# Patient Record
Sex: Male | Born: 1945
Health system: Southern US, Community
[De-identification: ages and names within clinical notes are randomized; demographics above are authoritative.]

## PROBLEM LIST (undated history)

## (undated) DIAGNOSIS — E119 Type 2 diabetes mellitus without complications: Secondary | ICD-10-CM

## (undated) DIAGNOSIS — I10 Essential (primary) hypertension: Secondary | ICD-10-CM

## (undated) DIAGNOSIS — M1990 Unspecified osteoarthritis, unspecified site: Secondary | ICD-10-CM

## (undated) HISTORY — DX: Type 2 diabetes mellitus without complications: E11.9

## (undated) HISTORY — DX: Essential (primary) hypertension: I10

## (undated) HISTORY — DX: Unspecified osteoarthritis, unspecified site: M19.90

---

## 2013-02-12 ENCOUNTER — Ambulatory Visit: Payer: Medicare Other | Attending: Audiology | Admitting: Audiology

## 2013-02-12 DIAGNOSIS — H905 Unspecified sensorineural hearing loss: Secondary | ICD-10-CM | POA: Insufficient documentation

## 2013-02-12 NOTE — Patient Instructions (Addendum)
CONCLUSION:   Mr. 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  This is supported by the DPOAE test results.  He has mildly reduced speech understanding abilities on the left side at this time.  His middle ear function is normal bilaterally and acoustic reflexes are present on both sides.  RECOMMENDATIONS:    1. Audiological re-evaluation in one year to monitor asymmetrical hearing loss. 2. Continue to monitor hearing at home.  Should any changes be noted, a re-evaluation should be scheduled sooner. 3. Mr. Connor [name] was counseled on the the use of ear protection and given strategies to manipulate his environment for optimal hearing success. 4. Mr. Connor [name] is a hearing aid candidate and a hearing aid trial/fitting is recommended for the left side.   Allyn Kenner Pugh, Au.D. Doctor of Audiology

## 2013-02-12 NOTE — Procedures (Signed)
   [facility] [address] [place], [place]  [address] [phone]  AUDIOLOGICAL EVALUATION  Patient Name: Connor [name]  Medical Record Number:  [id number] Date of Birth:  1946/04/11    Date of Test:  02/12/2013  HISTORY:  [name], a delightful 67 y.o. old  was seen for audiological evaluation upon referral of Joycelyn Man, FNP.   The patient reported a gradual decrease in hearing over the past 30 years following noise exposure in the [employer].  He reports intermittent bilateral tinnitus and intermittent difficulty with communication in background noise.  He denies head trauma, facial numbness, dizziness, middle ear disease and familial history of hearing loss.  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  Mr. Wallis Bamberg has Type II diabetes and is on daily injections.    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   Speech reception thresholds were consistent with the pure tone results indicative of good test reliability.  Speech recognition testing was conducted in each ear independently, at a comfortable listening level 55dBHL and indicated 100% and 88% in the right and left ears respectively.  Impedance audiometry was utilized and Type A was obtained on the right side and a Type A was obtained on the left side suggesting good middle ear function bilaterally.  Acoustic reflexes were tested from 500Hz  - 4000Hz  and were present on the right side and present on the left side.  Distortion Product Otoacoustic Emissions were tested from 2000Hz  - 10000Hz  and were weak on the right side and weak or absent on the left side, indicative of poor outer  hair cell function within the inner ear.  CONCLUSION:   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  This is supported by the DPOAE test results.  He has mildly reduced speech understanding abilities on the left side at this time.  His middle ear function is normal bilaterally and acoustic reflexes are present on both sides.  RECOMMENDATIONS:    1. Audiological re-evaluation in one year to monitor asymmetrical hearing loss. 2. Continue to monitor hearing at home.  Should any changes be noted, a re-evaluation should be scheduled sooner. 3. [name] was counseled on the the use of ear protection and given strategies to manipulate his environment for optimal hearing success. 4. [name] is a hearing aid candidate and a hearing aid trial/fitting is recommended for the left side.   Allyn Kenner Pugh, Au.D. Doctor of Audiology CCC-A

## 2013-02-22 ENCOUNTER — Ambulatory Visit: Payer: Medicaid Other | Admitting: Audiology

## 2014-07-26 ENCOUNTER — Encounter: Payer: Self-pay | Admitting: Rehabilitative and Restorative Service Providers"

## 2014-07-26 ENCOUNTER — Ambulatory Visit: Payer: Medicare Other | Attending: Specialist | Admitting: Rehabilitative and Restorative Service Providers"

## 2014-07-26 VITALS — BP 157/76

## 2014-07-26 DIAGNOSIS — R51 Headache: Secondary | ICD-10-CM | POA: Insufficient documentation

## 2014-07-26 DIAGNOSIS — E119 Type 2 diabetes mellitus without complications: Secondary | ICD-10-CM | POA: Diagnosis not present

## 2014-07-26 DIAGNOSIS — R42 Dizziness and giddiness: Secondary | ICD-10-CM | POA: Insufficient documentation

## 2014-07-26 DIAGNOSIS — I10 Essential (primary) hypertension: Secondary | ICD-10-CM | POA: Insufficient documentation

## 2014-07-26 NOTE — Patient Instructions (Signed)
Gaze Stabilization: Tip Card 1.Target must remain in focus, not blurry, and appear stationary while head is in motion. 2.Perform exercises with small head movements (45 to either side of midline). 3.Increase speed of head motion so long as target is in focus. 4.If you wear eyeglasses, be sure you can see target through lens (therapist will give specific instructions for bifocal / progressive lenses). 5.These exercises may provoke dizziness or nausea. Work through these symptoms. If too dizzy, slow head movement slightly. Rest between each exercise. 6.Exercises demand concentration; avoid distractions. 7.For safety, perform standing exercises close to a counter, wall, corner, or next to someone.  Copyright  VHI. All rights reserved.  Gaze Stabilization: Sitting   Keeping eyes on target on wall 3-5 feet away,  move head side to side for _30___ seconds. Repeat while moving head up and down for _30___ seconds. Do _3___ sessions per day.  Copyright  VHI. All rights reserved.   Walking Program:  Begin walking for exercise for 10 minutes, 1 times/day, 5 days/week.   Progress your walking program by adding 2-3 minutes to your routine each week, as tolerated. Be sure to wear good walking shoes, walk in a safe environment and only progress to your tolerance.      Progress to 30 minutes 4-5 times/week as knees allow.

## 2014-07-26 NOTE — Therapy (Signed)
[facility] [address] [place], [place], [address] Phone: [phone]   Fax:  [phone]  Physical Therapy Evaluation  Patient Details  Name: Connor [name] MRN: [id number] Date of Birth: [birth year] Referring Provider:  Nicolasa Ducking, MD  Encounter Date: 07/26/2014      PT End of Session - 07/26/14 1419    Visit Number 1  G code 1   Number of Visits 4   Date for PT Re-Evaluation 08/24/14   PT Start Time 0845   PT Stop Time 0935   PT Time Calculation (min) 50 min   Activity Tolerance Patient tolerated treatment well   Behavior During Therapy [facility] for tasks assessed/performed      Past Medical History  Diagnosis Date  . Diabetes mellitus without complication   . Hypertension   . Arthritis     knees    No past surgical history on file.  BP 157/76 mmHg  Visit Diagnosis:  Dizziness and giddiness      Subjective Assessment - 07/26/14 0852    Symptoms The patient reports onset of dizziness worse when transitioning from sit>stand and occasionally when sitting up from supine.  He reports occasional episodes of dizziness with bed mobility. He is uncertain of insidious versus sudden onset.   He also has h/o migraines and reports this can occur up to 2x/day.  He can have dizziness and headaches together and separate from each other.  The patient reports recent MRI WNLs.  He reports headaches last for up to 5 minutes, with associated light sensitivity, tinnitus.  He is negative for nausea/vomitting.     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  He reports room spinning sensation that lasts 5-10 minutes when rising.  He reports double vision at times, and occasionally wakes with numbness in his hands.   No visual aura noted with headaches.    Pertinent History L sensorineural hearing  > R side, agent orange exposure in [place]   Patient Stated Goals Decrease  vertigo. He notes feeling embarassed by speaking with others.   Currently in Pain? Yes  knee arthritic pain, not treated under current referral          [facility] PT Assessment - 07/26/14 0001    Assessment   Medical Diagnosis vertigo   Onset Date --  2 years ago   Balance Screen   Has the patient fallen in the past 6 months No   Has the patient had a decrease in activity level because of a fear of falling?  No   Is the patient reluctant to leave their home because of a fear of falling?  No   Home Environment   Living Enviornment Private residence   Type of Home House   Home Layout Two level  stair lift   Home Equipment --  stair lift   Prior Function   Vocation Retired   Observation/Other Assessments   Focus on Therapeutic Outcomes (FOTO)  Functional Status Measures: 56%   Other Surveys  --  DHI=38% (moderate impairment)   Ambulation/Gait   Ambulation/Gait Yes   Ambulation/Gait Assistance 7: Independent   Ambulation Distance (Feet) --  150   Gait Pattern --  mildly antalgic due to knee pain, equal step length   Ambulation Surface Level   Gait velocity 2.61 ft/sec   Balance   Balance Assessed --  Eyes closed x 6 second, feet together eyes open incr'd sway  Vestibular Assessment - 07/26/14 0905    VOR 1 Head Only (x 1 viewing) --  patient reports visual blurring   VOR Cancellation Normal  mild lightheadedness noted   Comment --  head impulse test R side provokes dizziness   Dix-Hallpike Dix-Hallpike Right;Dix-Hallpike Left   Dix-Hallpike Right Symptoms No nystagmus   Dix-Hallpike Left Symptoms No nystagmus   BP supine (x 5 minutes) 164/76 mmHg   BP standing (after 1 minute) 158/78 mmHg   BP standing (after 3 minutes) 158/82 mmHg   Orthostatics Comment --  Pt c/o dizziness described as spinning x seconds upon rising      NEUROMUSCULAR RE-EDUCATION: Seated gaze x 1 viewing Discussed general walking and PT to progress to patient  tolerance.           PT Education - 07/26/14 1419    Education provided Yes   Education Details HEP: gaze x 1 seated, discussed role of PT in treatment   Person(s) Educated Patient   Methods Explanation;Demonstration;Handout   Comprehension Verbalized understanding;Returned demonstration             PT Long Term Goals - 07/26/14 1420    PT LONG TERM GOAL #1   Title The patient will be indep with HEP for gaze x 1 viewing, habituation and general walking.  Target date 08/24/2014.   Time 4   Period Weeks   PT LONG TERM GOAL #2   Title The patient will perform supine<>bilateral rolling without subjective report of dizziness.  Target date 08/24/2014.   Time 4   Period Weeks   PT LONG TERM GOAL #3   Title The patient will tolerate gaze x 1 viewing x 30 seconds without increased dizziness.  Target date 08/24/2014.   Time 4   Period Weeks               Plan - 07/26/14 1421    Clinical Impression Statement The patient is a 69 yo male presenting to outpatient therapy with multiple contributing factors to dizziness.  The patient is reporting more of a postural presentation of symptoms, however orthostatics WNLs at today's session.  He also has hypertension (with mildly elevated BP today) and frequent headaches, which may also be contributing to general dizziness.  PT to address the components of movement that provoked symptoms and progress general mobility to patient tolerance to optimize current functional status.   Pt will benefit from skilled therapeutic intervention in order to improve on the following deficits Abnormal gait;Decreased activity tolerance;Decreased balance;Decreased mobility;Decreased strength;Postural dysfunction;Difficulty walking   Rehab Potential Good   PT Frequency 1x / week   PT Duration 4 weeks   PT Treatment/Interventions Therapeutic activities;Patient/family education;Therapeutic exercise;Gait training;Balance training;Neuromuscular  re-education;Functional mobility training   PT Next Visit Plan Check gaze ex.  Add habituation rolling and balance exercises with eyes open/closed in the corner.  Add home walking program/discuss aquatics if patient open to try.   Recommended Other Services audiology f/u re: 01/2013 recommendations for L hearing aide.   Consulted and Agree with Plan of Care Patient          G-Codes - 07/26/14 1425    Functional Assessment Tool Used DHI=38%   Functional Limitation Self care   Self Care Current Status (Z6109(G8987) At least 20 percent but less than 40 percent impaired, limited or restricted   Self Care Goal Status (U0454(G8988) At least 1 percent but less than 20 percent impaired, limited or restricted       Problem List There  are no active problems to display for this patient.   Miniya Miguez, PT 07/26/2014, 2:26 PM  [facility] [address] [place], [place], [address] Phone: [phone]   Fax:  [phone]

## 2014-08-02 ENCOUNTER — Ambulatory Visit: Payer: Medicare Other | Admitting: Rehabilitative and Restorative Service Providers"

## 2014-08-02 VITALS — BP 127/84 | HR 79

## 2014-08-02 DIAGNOSIS — R42 Dizziness and giddiness: Secondary | ICD-10-CM | POA: Diagnosis not present

## 2014-08-02 NOTE — Patient Instructions (Signed)
Gaze Stabilization: Tip Card 1.Target must remain in focus, not blurry, and appear stationary while head is in motion. 2.Perform exercises with small head movements (45 to either side of midline). 3.Increase speed of head motion so long as target is in focus. 4.If you wear eyeglasses, be sure you can see target through lens (therapist will give specific instructions for bifocal / progressive lenses). 5.These exercises may provoke dizziness or nausea. Work through these symptoms. If too dizzy, slow head movement slightly. Rest between each exercise. 6.Exercises demand concentration; avoid distractions. 7.For safety, perform standing exercises close to a counter, wall, corner, or next to someone.  Copyright  VHI. All rights reserved.  Gaze Stabilization: Standing Feet Apart   Feet shoulder width apart, keeping eyes on target on wall _3___ feet away and move head side to side for _30___ seconds. Do _2-3___ sessions per day.  Copyright  VHI. All rights reserved.  Rolling   With pillow under head, start on back. Roll to the right. Hold position until symptoms stop, plus 30 seconds. Roll slowly onto left side. Hold position until symptoms stop, plus 30 seconds.  Repeat sequence _5___ times per session. Do __2__ sessions per day.  Copyright  VHI. All rights reserved. Sit to Side-Lying   Sit on edge of bed. Turn head to the left and then lie down onto the right side.  Hold until dizziness stops, plus 30 seconds. Sit up slowly.  Hold until dizziness stops, plus 30 seconds. Repeat sequence __5__ times per session. Do __2__ sessions per day.  Copyright  VHI. All rights reserved.    Special Instructions: Exercises may bring on mild to moderate symptoms of dizziness, headache that resolve within 30 minutes of completing exercises. If symptoms are lasting longer than 30 minutes, modify your exercises by:  >decreasing the # of times you complete each activity >ensuring your symptoms return  to baseline before moving onto the next exercise >dividing up exercises so you do not do them all in one session, but multiple short sessions throughout the day >doing them once a day until symptoms improve

## 2014-08-02 NOTE — Therapy (Signed)
[facility] [address] [place], [place], [address] Phone: [phone]   Fax:  [phone]  Physical Therapy Treatment  Patient Details  Name: Mallie DartingCurly Amodei MRN: [id number] Date of Birth: [birth year] Referring Provider:  Nicolasa DuckingPavelock, Richard, MD  Encounter Date: 08/02/2014      PT End of Session - 08/02/14 0859    Visit Number 2  G code 2   Number of Visits 4   Date for PT Re-Evaluation 08/24/14   PT Start Time 0803   PT Stop Time 0848   PT Time Calculation (min) 45 min   Activity Tolerance Patient tolerated treatment well   Behavior [facility] Therapy [facility] for tasks assessed/performed      Past Medical History  Diagnosis Date  . Diabetes mellitus without complication   . Hypertension   . Arthritis     knees    No past surgical history on file.  BP 127/84 mmHg  Pulse 79  Visit Diagnosis:  Dizziness and giddiness      Subjective Assessment - 08/02/14 0807    Symptoms The patient reports one episode of bed spinning when he went to bed.  He also notes a lightheaded sensation when rising.  He reports intermittent headaches and is not certain if blood pressure is elevated during those symptoms.       NEUROMUSCULAR RE-EDUCATION: Standing gaze x 1 with verbal cues and demonstration on slower pace in order to keep target focused.  Performed 30 seconds x 3 reps Habituation rolling R<>L with dizziness reported to the left side only.  Instructed in HEP for repetitive movement to habituate symptoms.  Sit>bilateral sidelying with dizziness reported to the right side only.   Instructed in habituation for brandt daroff HEP.  Gait: Ambulated x 5 minutes nonstop with occasional horizontal, vertical head turns that provoke 5-6/10 dizziness.  Discussed home walking program with short length of time, multiple times/day to improve mobility.  CANOLITH REPOSITIONING TECHNIQUE: R Epley's maneuver x 1 repetition.         PT  Education - 08/02/14 0836    Education provided Yes   Education Details HEP: gaze x 1 progressed to standing, rolling habituation, brandt daroff habituation.   Person(s) Educated Patient   Methods Explanation;Demonstration;Handout   Comprehension Returned demonstration;Verbalized understanding           PT Long Term Goals - 07/26/14 1420    PT LONG TERM GOAL #1   Title The patient will be indep with HEP for gaze x 1 viewing, habituation and general walking.  Target date 08/24/2014.   Time 4   Period Weeks   PT LONG TERM GOAL #2   Title The patient will perform supine<>bilateral rolling without subjective report of dizziness.  Target date 08/24/2014.   Time 4   Period Weeks   PT LONG TERM GOAL #3   Title The patient will tolerate gaze x 1 viewing x 30 seconds without increased dizziness.  Target date 08/24/2014.   Time 4   Period Weeks               Plan - 08/02/14 [id number]    Clinical Impression Statement The patient had minimal nystagmus noted today with sit<>R sidelying and reported sensation of room spinning with horizontal rolling.  PT provided HEP for habituation and treated with canolith repoistioning.   PT Next Visit Plan Balance exercises in the corner with eyes open/closed.  Discuss aquatics if appropriate.        Problem List  There are no active problems to display for this patient.   Teryl Gubler, PT 08/02/2014, 9:02 AM  [facility] [address] [place], [place], [address] Phone: [phone]   Fax:  [phone]

## 2014-08-09 ENCOUNTER — Ambulatory Visit: Payer: Medicare Other | Admitting: Rehabilitative and Restorative Service Providers"

## 2014-09-21 ENCOUNTER — Encounter: Payer: Self-pay | Admitting: Rehabilitative and Restorative Service Providers"

## 2014-09-21 NOTE — Therapy (Signed)
[facility] [address] [place], [place], [address] Phone: [phone]   Fax:  [phone]  Patient Details  Name: Connor [name] MRN: [id number] Date of Birth: January 30, 1946 Referring Provider:  No ref. provider found  Encounter Date: 09/21/2014  PHYSICAL THERAPY DISCHARGE SUMMARY  Visits from Start of Care: 2  Current functional level related to goals / functional outcomes: *patient did not return for visits after 2nd PT session. Goals not reassessed.   Remaining deficits: See initial summary for patient status.   Education / Equipment: HEP provided.  Plan: Patient agrees to discharge.  Patient goals were not met. Patient is being discharged due to not returning since the last visit.  ?????   Thank you for the referral of this patient.    [place], Pt 09/21/2014, 9:14 AM  [place] [address] [place], [place], [address] Phone: [phone]   Fax:  [phone]

## 2016-08-04 ENCOUNTER — Emergency Department (HOSPITAL_COMMUNITY)
Admission: EM | Admit: 2016-08-04 | Discharge: 2016-08-04 | Disposition: A | Discharge status: Home / Self Care | Payer: Medicare Other | Attending: Emergency Medicine | Admitting: Emergency Medicine

## 2016-08-04 ENCOUNTER — Encounter (HOSPITAL_COMMUNITY): Payer: Self-pay | Admitting: Emergency Medicine

## 2016-08-04 DIAGNOSIS — Z79899 Other long term (current) drug therapy: Secondary | ICD-10-CM | POA: Diagnosis not present

## 2016-08-04 DIAGNOSIS — H5712 Ocular pain, left eye: Secondary | ICD-10-CM | POA: Diagnosis present

## 2016-08-04 DIAGNOSIS — E119 Type 2 diabetes mellitus without complications: Secondary | ICD-10-CM | POA: Diagnosis not present

## 2016-08-04 DIAGNOSIS — Z794 Long term (current) use of insulin: Secondary | ICD-10-CM | POA: Diagnosis not present

## 2016-08-04 DIAGNOSIS — H209 Unspecified iridocyclitis: Secondary | ICD-10-CM | POA: Insufficient documentation

## 2016-08-04 DIAGNOSIS — I10 Essential (primary) hypertension: Secondary | ICD-10-CM | POA: Diagnosis not present

## 2016-08-04 DIAGNOSIS — Z7982 Long term (current) use of aspirin: Secondary | ICD-10-CM | POA: Insufficient documentation

## 2016-08-04 MED ORDER — PREDNISOLONE ACETATE 1 % OP SUSP: 1.0000 [drp] | OPHTHALMIC | 0 refills | Status: DC

## 2016-08-04 MED ORDER — TETRACAINE HCL 0.5 % OP SOLN
2.0000 [drp] | Freq: Once | OPHTHALMIC | Status: AC
  Administered 2016-08-04: 2 [drp] via OPHTHALMIC
  Filled 2016-08-04: qty 2

## 2016-08-04 MED ORDER — FLUORESCEIN SODIUM 0.6 MG OP STRP
1.0000 | ORAL_STRIP | Freq: Once | OPHTHALMIC | Status: AC
  Administered 2016-08-04: 1 via OPHTHALMIC
  Filled 2016-08-04: qty 1

## 2016-08-04 MED ORDER — NAPROXEN 375 MG PO TABS: 375.0000 mg | ORAL_TABLET | Freq: Two times a day (BID) | ORAL | 0 refills | Status: AC

## 2016-08-04 NOTE — ED Triage Notes (Signed)
Pt. Stated, [facility]. 2 days ago it started to get red and aches.

## 2016-08-04 NOTE — ED Provider Notes (Signed)
[facility] Provider Note   CSN: [id number] Arrival date & time: 08/04/16  [id number]     History   Chief Complaint Chief Complaint  Patient presents with  . Eye Problem    HPI Caryn BeeCurly Emelda FearFerguson is a 71 y.o. male.  HPI 71 year old male with history of hypertension, diabetes, PTSD, known left cataract status post recent cataract surgery on the left 2 weeks ago who presents with eye pain and redness. Patient states he has been recovering well since his operation 2 weeks ago. He was just seen on Friday, 2/15, and had a normal exam at that time. However, over the last 48 hours, he has developed eye redness as well as an aching, dull, throbbing type pain that is worse with movement as well as bright light. Denies any redness around the eye. Denies any changes in his vision. No floaters. He subsequent presents for evaluation. Denies any new trauma to the eye. He is only taking steroid drops at this time.  Past Medical History:  Diagnosis Date  . Arthritis    knees  . Diabetes mellitus without complication (HCC)   . Hypertension     There are no active problems to display for this patient.   History reviewed. No pertinent surgical history.     Home Medications    Prior to Admission medications   Medication Sig Start Date End Date Taking? Authorizing Provider  aspirin EC 81 MG tablet Take 81 mg by mouth daily.   Yes Historical Provider, MD  insulin aspart (NOVOLOG) 100 UNIT/ML injection Inject 8 Units into the skin 3 (three) times daily before meals.    Yes Historical Provider, MD  insulin glargine (LANTUS) 100 UNIT/ML injection Inject 36 Units into the skin at bedtime.   Yes Historical Provider, MD  losartan (COZAAR) 100 MG tablet Take 100 mg by mouth daily.   Yes Historical Provider, MD  metFORMIN (GLUCOPHAGE) 1000 MG tablet Take 1,000 mg by mouth 2 (two) times daily with a meal.   Yes Historical Provider, MD  Multiple Vitamins-Minerals (MULTIVITAMIN WITH MINERALS) tablet Take 1  tablet by mouth daily.   Yes Historical Provider, MD  SIMVASTATIN PO Take 1 tablet by mouth daily.   Yes Historical Provider, MD  glucose 4 GM chewable tablet Chew 1 tablet by mouth as needed for low blood sugar.    Historical Provider, MD  naproxen (NAPROSYN) 375 MG tablet Take 1 tablet (375 mg total) by mouth 2 (two) times daily with a meal. 08/04/16 08/11/16  Shaune Pollackameron Leann Mayweather, MD  prednisoLONE acetate (PRED FORTE) 1 % ophthalmic suspension Place 1 drop into the left eye every 4 (four) hours. 08/04/16   Shaune Pollackameron Asra Gambrel, MD  testosterone enanthate (DELATESTRYL) 200 MG/ML injection Pt does not know how much the dose is 07/26/16   Historical Provider, MD    Family History No family history on file.  Social History Social History  Substance Use Topics  . Smoking status: Never Smoker  . Smokeless tobacco: Never Used  . Alcohol use No     Allergies   Patient has no known allergies.   Review of Systems Review of Systems  Constitutional: Negative for chills and fever.  Eyes: Positive for pain and redness.  Respiratory: Negative for shortness of breath.   Cardiovascular: Negative for chest pain.  Musculoskeletal: Negative for neck pain.  Skin: Negative for rash and wound.  Allergic/Immunologic: Negative for immunocompromised state.  Neurological: Negative for weakness and numbness.  Hematological: Does not bruise/bleed easily.  Physical Exam Updated Vital Signs BP 172/98 (BP Location: Right Arm)   Pulse 73   Temp 98.8 F (37.1 C) (Oral)   Resp 15   Ht 5\' 11"  (1.803 m)   Wt 297 lb (134.7 kg)   SpO2 98%   BMI 41.42 kg/m   Physical Exam  Constitutional: He is oriented to person, place, and time. He appears well-developed and well-nourished. No distress.  HENT:  Head: Normocephalic and atraumatic.  Eyes: Conjunctivae are normal.  IOP 12 OD, 16 OS. On slit lamp examination, patient has marked conjunctival injection as well as ciliary flush of the left eye. The anterior chamber  appears quiet, however, with no cells or flare. The visualized lens appears in place. There is no hypopyon. No surrounding periorbital erythema or induration. No drainage.  Neck: Neck supple.  Cardiovascular: Normal rate, regular rhythm and normal heart sounds.  Exam reveals no friction rub.   No murmur heard. Pulmonary/Chest: Effort normal and breath sounds normal. No respiratory distress. He has no wheezes. He has no rales.  Abdominal: He exhibits no distension.  Musculoskeletal: He exhibits no edema.  Neurological: He is alert and oriented to person, place, and time. He exhibits normal muscle tone.  Skin: Skin is warm. Capillary refill takes less than 2 seconds.  Psychiatric: He has a normal mood and affect.  Nursing note and vitals reviewed.    ED Treatments / Results  Labs (all labs ordered are listed, but only abnormal results are displayed) Labs Reviewed - No data to display  EKG  EKG Interpretation None       Radiology No results found.  Procedures Procedures (including critical care time)  Medications Ordered in ED Medications  tetracaine (PONTOCAINE) 0.5 % ophthalmic solution 2 drop (2 drops Both Eyes Given 08/04/16 0808)  fluorescein ophthalmic strip 1 strip (1 strip Both Eyes Given 08/04/16 0808)     Initial Impression / Assessment and Plan / ED Course  I have reviewed the triage vital signs and the nursing notes.  Pertinent labs & imaging results that were available during my care of the patient were reviewed by me and considered in my medical decision making (see chart for details).    71 yo M s/p recent cataract removal and lens implantation 2 weeks ago who p/w left eye redness and pain. On exam, patient presentation is consistent with likely acute iritis with ciliary flush and diffuse conjunctival injection. He has no hypopyon, anterior chamber cell or flare, and no purulent eye discharge. I do not suspect endophthalmitis and eye movement is largely painless  with no evidence of post-septal cellulitis. He has no fevers, chills, or signs of systemic illness. His visual acuity is similar to its previous documentation on 2/15 note at [facility] as well as his intraocular pressure, without evidence of acute glaucoma. However, given his recent surgery, will consult [facility].  I discussed the case with [facility] on call. Of note, upon further history taking, patient reports running out of his prednisone drops. Primary suspicion is mild rebound iritis secondary to running out of his steroid drops. Cannot completely rule out endophthalmitis but given reassuring exam and history, myself as well as [facility] of a low suspicion for this. I discussed this with the patient. [facility] recommends continuing Pred Forte drops 6 times a day. I encouraged the patient to present to [facility] today or tomorrow for repeat examination and he is in agreement. I also discussed the possibility of early endophthalmitis and patient declines transfer to  [facility] at this time as he feels otherwise well and would like to attempt treatment with drops. This was called in to his pharmacy. Strict return precautions were given. Will discharge home.  Final Clinical Impressions(s) / ED Diagnoses   Final diagnoses:  Iritis    New Prescriptions Discharge Medication List as of 08/04/2016 11:21 AM    START taking these medications   Details  naproxen (NAPROSYN) 375 MG tablet Take 1 tablet (375 mg total) by mouth 2 (two) times daily with a meal., Starting Sun 08/04/2016, Until Sun 08/11/2016, Normal    prednisoLONE acetate (PRED FORTE) 1 % ophthalmic suspension Place 1 drop into the left eye every 4 (four) hours., Starting Sun 08/04/2016, Normal         Shaune Pollack, MD 08/04/16 [phone]

## 2018-11-01 ENCOUNTER — Ambulatory Visit (HOSPITAL_COMMUNITY)
Admission: EM | Admit: 2018-11-01 | Discharge: 2018-11-01 | Disposition: A | Discharge status: Home / Self Care | Payer: Medicare Other

## 2018-11-01 ENCOUNTER — Encounter (HOSPITAL_COMMUNITY): Payer: Self-pay

## 2018-11-01 ENCOUNTER — Other Ambulatory Visit: Payer: Self-pay

## 2018-11-01 DIAGNOSIS — H8112 Benign paroxysmal vertigo, left ear: Secondary | ICD-10-CM

## 2018-11-01 NOTE — ED Provider Notes (Signed)
[facility]    CSN: [id number] Arrival date & time: 11/01/18  1536     History   Chief Complaint Chief Complaint  Patient presents with  . veritgo    HPI Connor [name] is a 73 y.o. male history of hypertension, diabetes type 2, presenting today for evaluation of dizziness.  Patient states that over the past 2 days he has had 2-3 episodes of dizziness.  Symptoms have mainly been when he is changing position and sitting up from bed.  This feels very similar to when he previously had a bout of vertigo approximately 4 years ago.  Describes dizziness as room spinning.  Subsides after approximately 1 minute.  Denies nausea or vomiting.  Denies vision changes from his baseline.  Denies facial drooping, confusion, difficulty speaking, weakness, numbness or tingling, chest pain or shortness of breath.  Denies any recent fevers or viral illness.  No changes in hearing.  Does have tinnitus at baseline, but no increase in ringing.  HPI  Past Medical History:  Diagnosis Date  . Arthritis    knees  . Diabetes mellitus without complication (HCC)   . Hypertension     There are no active problems to display for this patient.   History reviewed. No pertinent surgical history.     Home Medications    Prior to Admission medications   Medication Sig Start Date End Date Taking? Authorizing Provider  aspirin EC 81 MG tablet Take 81 mg by mouth daily.    [provider]  glucose 4 GM chewable tablet Chew 1 tablet by mouth as needed for low blood sugar.    [provider]  insulin aspart (NOVOLOG) 100 UNIT/ML injection Inject 8 Units into the skin 3 (three) times daily before meals.     [provider]  insulin glargine (LANTUS) 100 UNIT/ML injection Inject 36 Units into the skin at bedtime.    [provider]  losartan (COZAAR) 100 MG tablet Take 100 mg by mouth daily.    [provider]  metFORMIN (GLUCOPHAGE) 1000 MG tablet Take 1,000  mg by mouth 2 (two) times daily with a meal.    [provider]  Multiple Vitamins-Minerals (MULTIVITAMIN WITH MINERALS) tablet Take 1 tablet by mouth daily.    [provider]  prednisoLONE acetate (PRED FORTE) 1 % ophthalmic suspension Place 1 drop into the left eye every 4 (four) hours. 08/04/16   Shaune Pollack, MD  SIMVASTATIN PO Take 1 tablet by mouth daily.    [provider]  testosterone enanthate (DELATESTRYL) 200 MG/ML injection Pt does not know how much the dose is 07/26/16   [provider]    Family History History reviewed. No pertinent family history.  Social History Social History   Tobacco Use  . Smoking status: Never Smoker  . Smokeless tobacco: Never Used  Substance Use Topics  . Alcohol use: No  . Drug use: No     Allergies   Patient has no known allergies.   Review of Systems Review of Systems  Constitutional: Negative for fatigue and fever.  HENT: Negative for congestion, sinus pressure and sore throat.   Eyes: Negative for photophobia, pain and visual disturbance.  Respiratory: Negative for cough and shortness of breath.   Cardiovascular: Negative for chest pain.  Gastrointestinal: Negative for abdominal pain, nausea and vomiting.  Genitourinary: Negative for decreased urine volume and hematuria.  Musculoskeletal: Negative for myalgias, neck pain and neck stiffness.  Neurological: Positive for dizziness. Negative for  syncope, facial asymmetry, speech difficulty, weakness, light-headedness, numbness and headaches.     Physical Exam Triage Vital Signs ED Triage Vitals  Enc Vitals Group     BP 11/01/18 1656 (!) 178/81     Pulse Rate 11/01/18 1618 74     Resp 11/01/18 1618 18     Temp 11/01/18 1618 98.6 F (37 C)     Temp Source 11/01/18 1618 Oral     SpO2 11/01/18 1618 98 %     Weight 11/01/18 1617 (!) 304 lb (137.9 kg)     Height --      Head Circumference --      Peak Flow --      Pain Score 11/01/18 1616  8     Pain Loc --      Pain Edu? --      Excl. in GC? --    No data found.  Updated Vital Signs BP (!) 178/81 Comment: blood pressure obtained by Jaedan Huttner, pa  Pulse 74   Temp 98.6 F (37 C) (Oral)   Resp 18   Wt (!) 304 lb (137.9 kg)   SpO2 98%   BMI 42.40 kg/m   Visual Acuity Right Eye Distance:   Left Eye Distance:   Bilateral Distance:    Right Eye Near:   Left Eye Near:    Bilateral Near:     Physical Exam Vitals signs and nursing note reviewed.  Constitutional:      Appearance: He is well-developed.  HENT:     Head: Normocephalic and atraumatic.     Ears:     Comments: Bilateral ears without tenderness to palpation of external auricle, tragus and mastoid, EAC's without erythema or swelling, TM's with good bony landmarks and cone of light. Non erythematous.     Mouth/Throat:     Comments: Oral mucosa pink and moist, no tonsillar enlargement or exudate. Posterior pharynx patent and nonerythematous, no uvula deviation or swelling. Normal phonation. Palate elevating symmetrically Eyes:     Extraocular Movements: Extraocular movements intact.     Conjunctiva/sclera: Conjunctivae normal.     Pupils: Pupils are equal, round, and reactive to light.     Comments: No photophobia with exam  Neck:     Musculoskeletal: Neck supple.  Cardiovascular:     Rate and Rhythm: Normal rate and regular rhythm.     Heart sounds: No murmur.  Pulmonary:     Effort: Pulmonary effort is normal. No respiratory distress.     Breath sounds: Normal breath sounds.     Comments: Breathing comfortably at rest, CTABL, no wheezing, rales or other adventitious sounds auscultated Abdominal:     Palpations: Abdomen is soft.     Tenderness: There is no abdominal tenderness.  Skin:    General: Skin is warm and dry.  Neurological:     General: No focal deficit present.     Mental Status: He is alert and oriented to person, place, and time. Mental status is at baseline.     Comments:  Patient A&O x3, cranial nerves II-XII grossly intact, strength at shoulders, hips and knees 5/5, equal bilaterally, bilateral patellar reflexes difficult to obtain, ambulating from chair to exam table without abnormality or assistance.  Positive Dix-Hallpike with horizontal nystagmus with leftward rotation of head      UC Treatments / Results  Labs (all labs ordered are listed, but only abnormal results are displayed) Labs Reviewed - No data to display  EKG None  Radiology No results  found.  Procedures Procedures (including critical care time)  Medications Ordered in UC Medications - No data to display  Initial Impression / Assessment and Plan / UC Course  I have reviewed the triage vital signs and the nursing notes.  Pertinent labs & imaging results that were available during my care of the patient were reviewed by me and considered in my medical decision making (see chart for details).     Positive Dix-Hallpike with leftward rotation, symptoms most likely BPPV given this exam finding as well as previous history of this.  Epley maneuver attempted after triggering of symptoms in clinic.  Deferring any meclizine given symptoms brief to avoid sedation.  Recommended performing Epley maneuver and logroll for decrease in frequency and intensity of symptoms.  No neuro deficits noted.  Do not suspect intracranial abnormality as cause of symptoms although patient's age does make him higher risk.  Do not suspect underlying cardiac etiology at this time.  Blood pressure is elevated, will have patient continue to monitor this and follow-up if developing worsening symptoms.  Advised to go to emergency room if symptoms changing or worsening.  Discussed strict return precautions. Patient verbalized understanding and is agreeable with plan.  Final Clinical Impressions(s) / UC Diagnoses   Final diagnoses:  Benign paroxysmal positional vertigo of left ear     Discharge Instructions     Your  symptoms are suggestive of vertigo Please be sure to drink plenty of fluids Do not make quick movements Perform Epley maneuver 6-8 times in one sitting 2-3 times a day to help with vertigo symptoms- see attached info You could also youtube epley maneuver for video demonstration, may also try logroll/BBQ maneuver (youtube) Follow up with ENT if symptoms persisting Follow-up in emergency room if developing weakness, facial drooping, difficulty speaking, dizziness different from typical vertigo symptoms, lightheadedness, vision changes, chest pain or shortness of breath  Your blood pressure was elevated today in clinic. Please be sure to take blood pressure medications as prescribed. Please monitor your blood pressure at home or when you go to a [facility]. Please follow up with your primary care doctor to recheck blood pressure and discuss any need for medication changes.   Please go to Emergency Room if you start to experience severe headache, vision changes, decreased urine production, chest pain, shortness of breath, speech slurring, one sided weakness.   ED Prescriptions    None     Controlled Substance Prescriptions [place] Controlled Substance Registry consulted? Not Applicable   Lew Dawes, [place] 11/01/18 1704

## 2018-11-01 NOTE — ED Triage Notes (Addendum)
Pt cc thinks his vertigo is flaring for 2 days. Pt states when he gets up in the morning and when he lay down at night he's a little dizzy for a few minutes and then it stops and then he's fine.

## 2018-11-01 NOTE — Discharge Instructions (Addendum)
Your symptoms are suggestive of vertigo Please be sure to drink plenty of fluids Do not make quick movements Perform Epley maneuver 6-8 times in one sitting 2-3 times a day to help with vertigo symptoms- see attached info You could also youtube epley maneuver for video demonstration, may also try logroll/BBQ maneuver (youtube) Follow up with ENT if symptoms persisting Follow-up in emergency room if developing weakness, facial drooping, difficulty speaking, dizziness different from typical vertigo symptoms, lightheadedness, vision changes, chest pain or shortness of breath  Your blood pressure was elevated today in clinic. Please be sure to take blood pressure medications as prescribed. Please monitor your blood pressure at home or when you go to a [facility]. Please follow up with your primary care doctor to recheck blood pressure and discuss any need for medication changes.   Please go to Emergency Room if you start to experience severe headache, vision changes, decreased urine production, chest pain, shortness of breath, speech slurring, one sided weakness.

## 2019-05-03 ENCOUNTER — Ambulatory Visit (INDEPENDENT_AMBULATORY_CARE_PROVIDER_SITE_OTHER): Payer: Medicare Other | Admitting: Pulmonary Disease

## 2019-05-03 ENCOUNTER — Other Ambulatory Visit: Payer: Self-pay

## 2019-05-03 ENCOUNTER — Encounter: Payer: Self-pay | Admitting: Pulmonary Disease

## 2019-05-03 VITALS — BP 132/80 | HR 71 | Temp 97.2°F | Ht 71.0 in | Wt 311.4 lb

## 2019-05-03 DIAGNOSIS — R0600 Dyspnea, unspecified: Secondary | ICD-10-CM

## 2019-05-03 DIAGNOSIS — Z77090 Contact with and (suspected) exposure to asbestos: Secondary | ICD-10-CM

## 2019-05-03 NOTE — Progress Notes (Signed)
Connor [name]    [id number]    Oct 29, 1945  Primary Care Physician:Patient, No Pcp Per  Referring Physician: No referring provider defined for this encounter.  Chief complaint: Evaluation for asbestosis  HPI: 73 year old with history of hypertension, diabetes Self-referred here for evaluation of asbestosis.  States that he has mild dyspnea on exertion which is unchanged over the years.  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  Tells me that he had a chest x-ray 10 years ago when he was hospitalized for pneumonia.  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  No mold, hot tub, Jacuzzi.  No down pillows or comforters Smoking history: 5-10 pack-year smoker.  Quit in 1995 Travel history: Originally from [place].  Previously lived in [place].  No significant recent travel Relevant family history: No significant family history of lung disease  Outpatient Encounter Medications as of 05/03/2019  Medication Sig  . aspirin EC 81 MG tablet Take 81 mg by mouth daily.  . dorzolamide-timolol (COSOPT) 22.3-6.8 MG/ML ophthalmic solution Place 1 drop into the left eye 2 (two) times daily.   Marland Kitchen glucose 4 GM chewable tablet Chew 1 tablet by mouth as needed for low blood sugar.  . insulin aspart (NOVOLOG) 100 UNIT/ML injection Inject 8 Units into the skin 3 (three) times daily before meals.   . insulin glargine (LANTUS) 100 UNIT/ML injection Inject 36 Units into the skin at bedtime.  Marland Kitchen losartan (COZAAR) 100 MG tablet Take 100 mg by mouth daily.  . metFORMIN (GLUCOPHAGE) 1000 MG tablet Take 1,000 mg by mouth 2 (two) times daily with a meal.  . Multiple Vitamins-Minerals (MULTIVITAMIN WITH MINERALS)  tablet Take 1 tablet by mouth daily.  . prednisoLONE acetate (PRED FORTE) 1 % ophthalmic suspension Place 1 drop into the left eye every 4 (four) hours.  Marland Kitchen SIMVASTATIN PO Take 1 tablet by mouth daily.  Marland Kitchen testosterone enanthate (DELATESTRYL) 200 MG/ML injection Inject 200 mg into the muscle every 28 (twenty-eight) days. Pt does not know how much the dose is  . Vitamin D, Ergocalciferol, (DRISDOL) 1.25 MG (50000 UT) CAPS capsule Take 50,000 Units by mouth 2 (two) times a week.   No facility-administered encounter medications on file as of 05/03/2019.     Allergies as of 05/03/2019  . (No Known Allergies)    Past Medical History:  Diagnosis Date  . Arthritis    knees  . Diabetes mellitus without complication ([place])   . Hypertension     History reviewed. No pertinent surgical history.  History reviewed. No pertinent family history.  Social History   Socioeconomic History  . Marital status: Unknown    Spouse name: Not on file  . Number of children: Not on file  . Years of education: Not on file  . Highest education level: Not on file  Occupational History  . Not on file  Social Needs  . Financial resource strain: Not on file  . Food insecurity    Worry: Not on file    Inability: Not on file  . Transportation needs    Medical: Not on file    Non-medical: Not on file  Tobacco Use  . Smoking status: Former Smoker  Packs/day: 0.50    Years: 12.00    Pack years: 6.00    Types: Cigarettes    Quit date: 54    Years since quitting: 25.8  . Smokeless tobacco: Never Used  Substance and Sexual Activity  . Alcohol use: No  . Drug use: No  . Sexual activity: Not on file  Lifestyle  . Physical activity    Days per week: Not on file    Minutes per session: Not on file  . Stress: Not on file  Relationships  . Social Musician on phone: Not on file    Gets together: Not on file    Attends religious service: Not on file    Active member of club or  organization: Not on file    Attends meetings of clubs or organizations: Not on file    Relationship status: Not on file  . Intimate partner violence    Fear of current or ex partner: Not on file    Emotionally abused: Not on file    Physically abused: Not on file    Forced sexual activity: Not on file  Other Topics Concern  . Not on file  Social History Narrative  . Not on file    Review of systems: Review of Systems  Constitutional: Negative for fever and chills.  HENT: Negative.   Eyes: Negative for blurred vision.  Respiratory: as per HPI  Cardiovascular: Negative for chest pain and palpitations.  Gastrointestinal: Negative for vomiting, diarrhea, blood per rectum. Genitourinary: Negative for dysuria, urgency, frequency and hematuria.  Musculoskeletal: Negative for myalgias, back pain and joint pain.  Skin: Negative for itching and rash.  Neurological: Negative for dizziness, tremors, focal weakness, seizures and loss of consciousness.  Endo/Heme/Allergies: Negative for environmental allergies.  Psychiatric/Behavioral: Negative for depression, suicidal ideas and hallucinations.  All other systems reviewed and are negative.  Physical Exam: Blood pressure 132/80, pulse 71, temperature (!) 97.2 F (36.2 C), temperature source Temporal, height 5\' 11"  (1.803 m), weight (!) 311 lb 6.4 oz (141.3 kg), SpO2 95 %. Gen:      No acute distress, obese HEENT:  EOMI, sclera anicteric Neck:     No masses; no thyromegaly Lungs:    Clear to auscultation bilaterally; normal respiratory effort CV:         Regular rate and rhythm; no murmurs Abd:      + bowel sounds; soft, non-tender; no palpable masses, no distension Ext:    No edema; adequate peripheral perfusion Skin:      Warm and dry; no rash Neuro: alert and oriented x 3 Psych: normal mood and affect  Data Reviewed:   Assessment:  Assessment for asbestosis Schedule high-res CT for further evaluation We will also need pulmonary  function test but this may take at least 2 months since we are backed up in the PFT lab  Follow-up in 2 weeks for reassessment of measuring and plan for next steps.    Plan/Recommendations: - High-res CT  MD [facility] 05/03/2019, 8:38 AM  CC: No ref. provider found

## 2019-05-03 NOTE — Addendum Note (Signed)
Addended byMarshell Garfinkel on: 05/03/2019 09:33 AM   Modules accepted: Level of Service

## 2019-05-03 NOTE — Patient Instructions (Signed)
We will schedule you for high-resolution CT for evaluation of possible asbestos related lung disease Follow-up in 2 weeks.

## 2019-05-07 ENCOUNTER — Ambulatory Visit (HOSPITAL_COMMUNITY): Payer: Medicare Other

## 2019-05-10 ENCOUNTER — Ambulatory Visit (HOSPITAL_COMMUNITY)
Admission: RE | Admit: 2019-05-10 | Discharge: 2019-05-10 | Disposition: A | Discharge status: Home / Self Care | Payer: Medicare Other | Source: Ambulatory Visit | Attending: Pulmonary Disease | Admitting: Pulmonary Disease

## 2019-05-10 ENCOUNTER — Encounter (HOSPITAL_COMMUNITY): Payer: Self-pay

## 2019-05-10 ENCOUNTER — Other Ambulatory Visit: Payer: Self-pay

## 2019-05-10 DIAGNOSIS — R0600 Dyspnea, unspecified: Secondary | ICD-10-CM | POA: Insufficient documentation

## 2019-05-10 IMAGING — CT CT CHEST HIGH RESOLUTION W/O CM
2 of 6 series · 13 of 36 positions shown, 16 images · non-contrast
Comparison: No priors.

CLINICAL DATA: 73-year-old male with history of asbestos exposure.
Suspected interstitial lung disease. Dyspnea, shortness of breath
and wheezing.

EXAM:
CT CHEST WITHOUT CONTRAST
TECHNIQUE: Multidetector CT imaging of the chest was performed following the
standard protocol without intravenous contrast. High resolution
imaging of the lungs, as well as inspiratory and expiratory imaging,
was performed.

[Series 2: thorax · axial · 0.81mm/px · z∈[-379,-77]mm · 10 of 179 slices shown, 13 images]
[im 14/179  mediastinal]
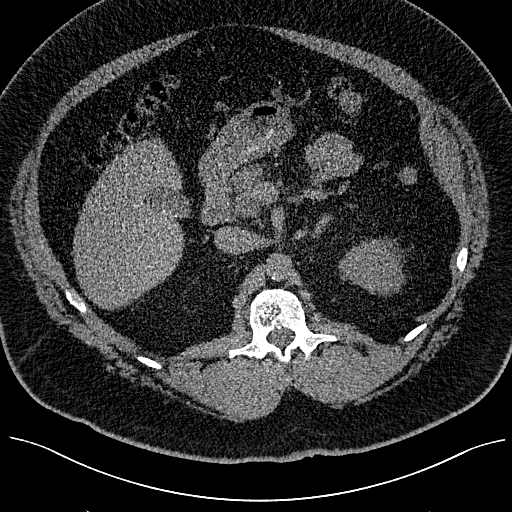
[im 14/179  lung]
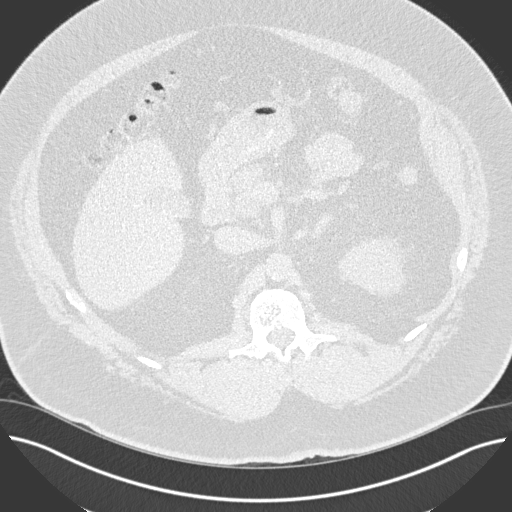
[im 28/179  lung]
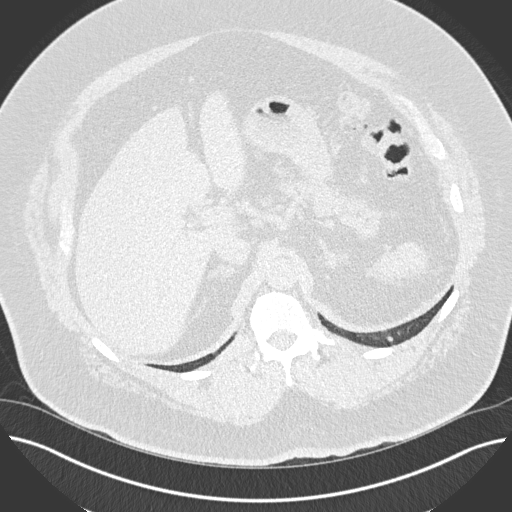
[im 55/179  lung]
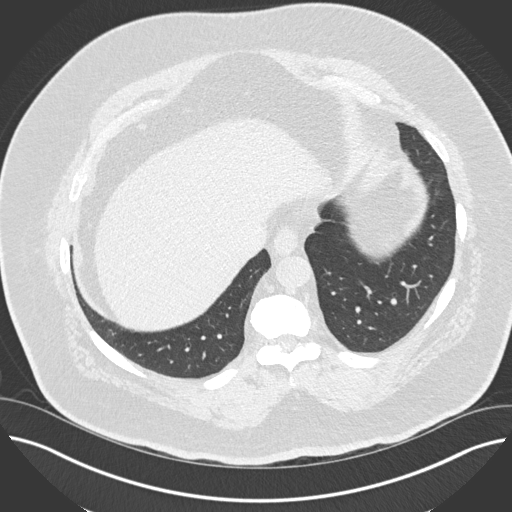
[im 69/179  lung]
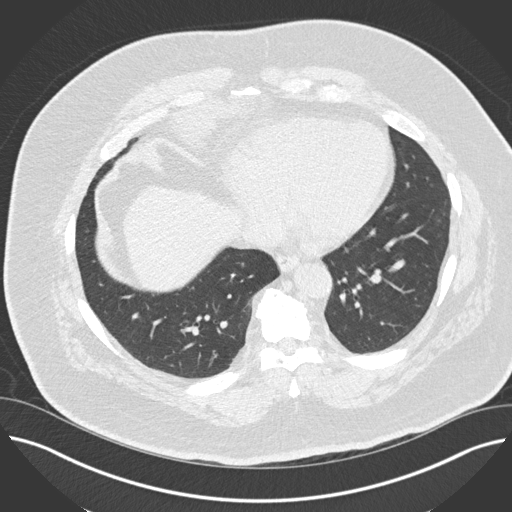
[im 83/179  mediastinal]
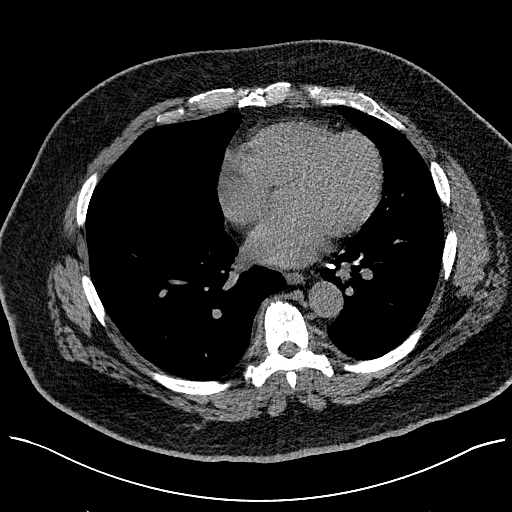
[im 83/179  lung]
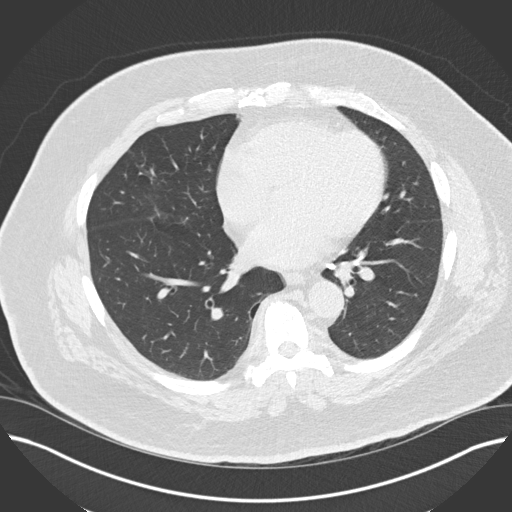
[im 96/179  lung]
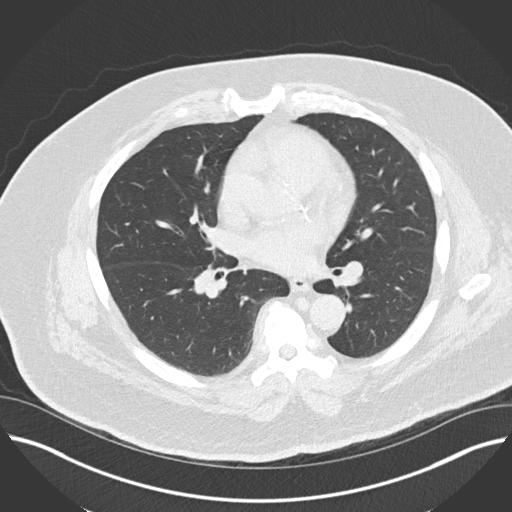
[im 110/179  lung]
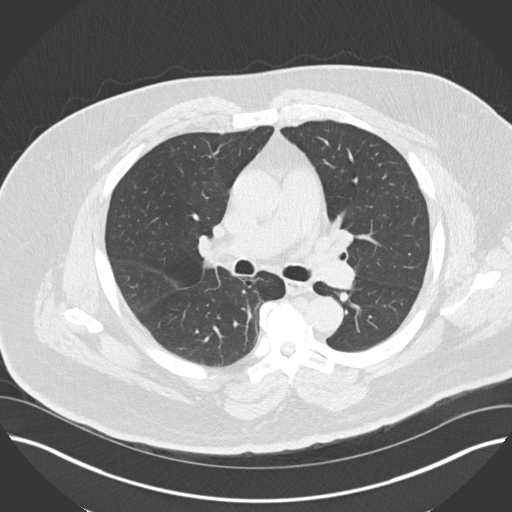
[im 137/179  lung]
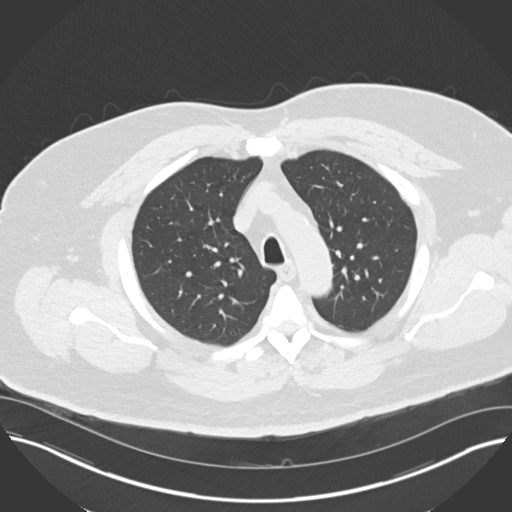
[im 151/179  mediastinal]
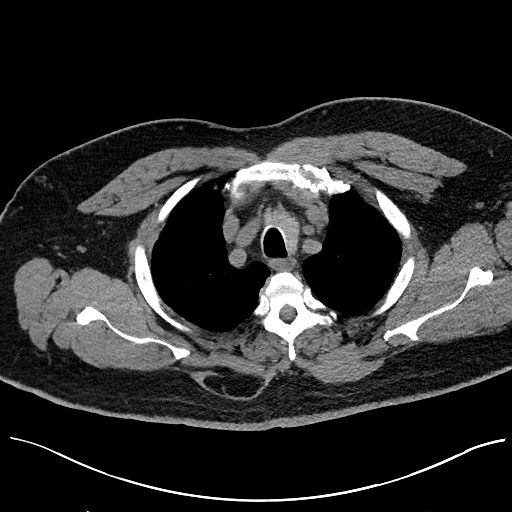
[im 151/179  lung]
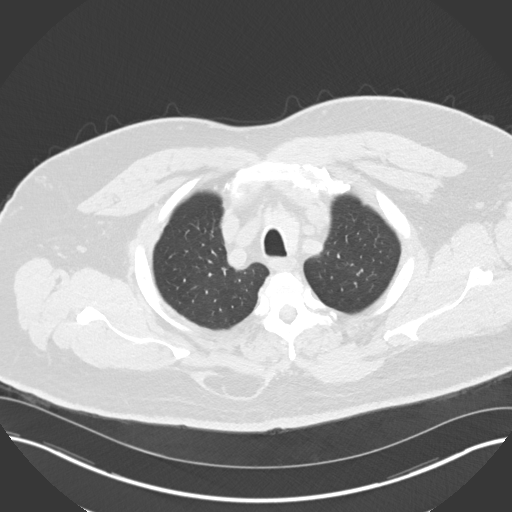
[im 165/179  lung]
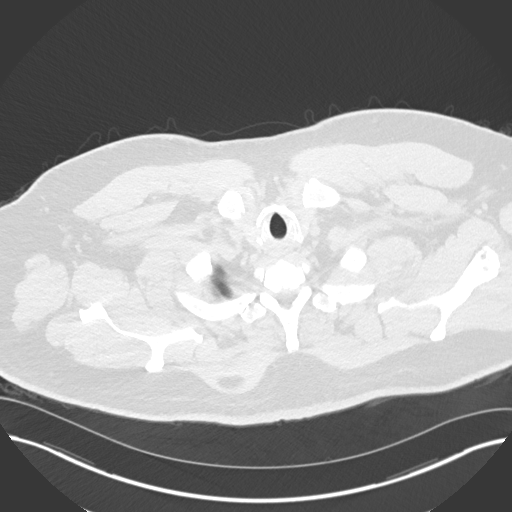

[Series 7: coronal · coronal · 0.72mm/px · 3 of 113 slices shown]
[im 23/113  lung]
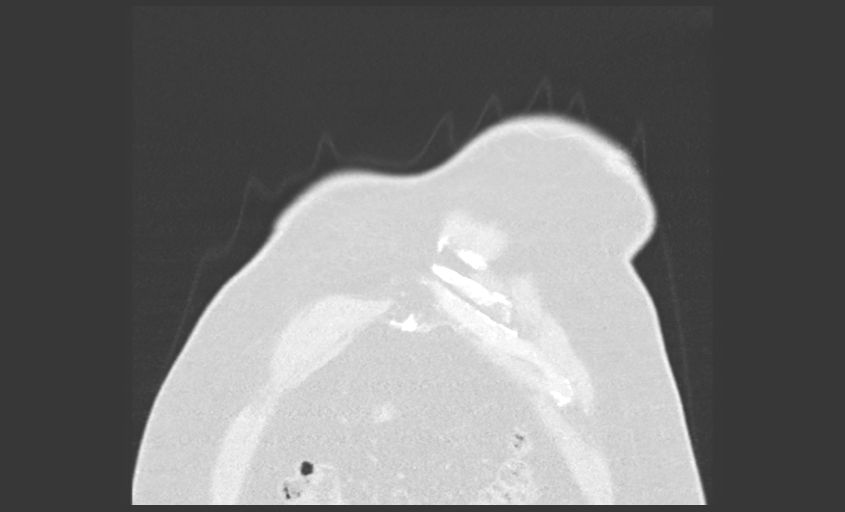
[im 45/113  lung]
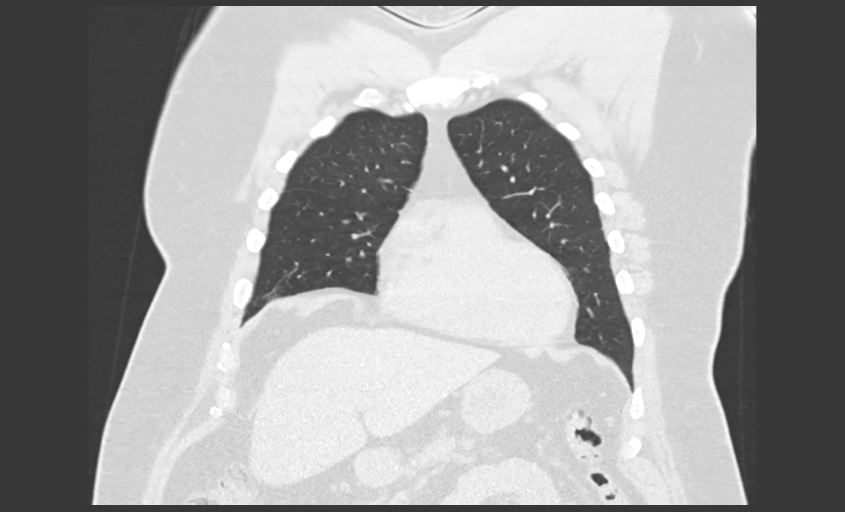
[im 68/113  lung]
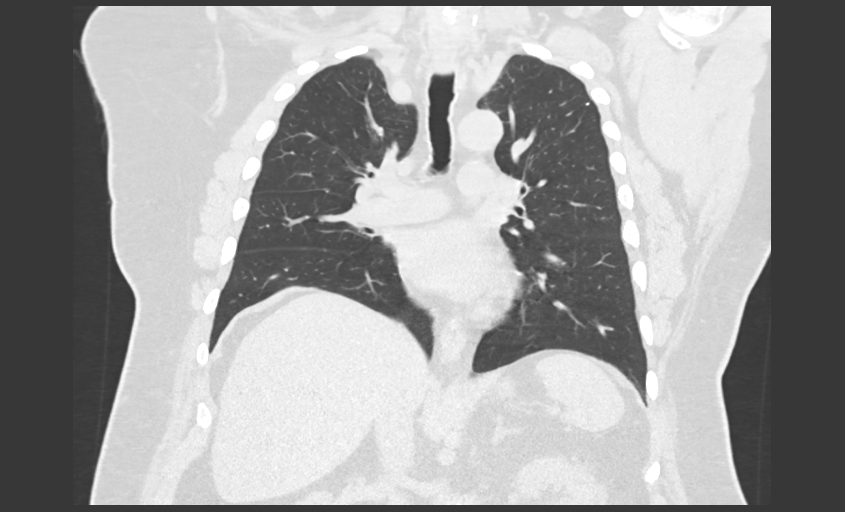

[13 of 36 positions shown; findings below may reference images not displayed]

FINDINGS: Cardiovascular: Heart size is normal. There is no significant
pericardial fluid, thickening or pericardial calcification. There is
aortic atherosclerosis, as well as atherosclerosis of the great
vessels of the mediastinum and the coronary arteries, including
calcified atherosclerotic plaque in the left main, left anterior
descending, left circumflex and right coronary arteries.

Mediastinum/Nodes: No pathologically enlarged mediastinal or hilar
lymph nodes. Please note that accurate exclusion of hilar adenopathy
is limited on noncontrast CT scans. Multiple densely calcified left
hilar lymph and mediastinal nodes are incidentally noted. Esophagus
is unremarkable in appearance. No axillary lymphadenopathy.

Lungs/Pleura: High-resolution images demonstrate no significant
regions of ground-glass attenuation, septal thickening, subpleural
reticulation, parenchymal banding, traction bronchiectasis or
honeycombing. Inspiratory and expiratory imaging is unremarkable.
Multiple tiny noncalcified pulmonary nodules measuring 5 mm or less
in size scattered throughout the lungs bilaterally. Several small
calcified granulomas are also noted. No calcified or noncalcified
pleural plaques are identified.

Upper Abdomen: Aortic atherosclerosis. Tiny calcified gallstones
lying dependently in the gallbladder.

Musculoskeletal: There are no aggressive appearing lytic or blastic
lesions noted in the visualized portions of the skeleton.
IMPRESSION: 1. No findings to suggest interstitial lung disease.
2. No evidence of asbestos related pleural disease.
3. Multiple small pulmonary nodules measuring 5 mm or less in size,
nonspecific, but statistically likely benign. No follow-up needed if
patient is low-risk (and has no known or suspected primary
neoplasm). Non-contrast chest CT can be considered in 12 months if
patient is high-risk. This recommendation follows the consensus
statement: Guidelines for Management of Incidental Pulmonary Nodules
Detected on CT Images: From the [HOSPITAL] [EJ]; Radiology
[EJ]; [DATE].
4. There is aortic atherosclerosis, as well as atherosclerosis of
the great vessels of the mediastinum and the coronary arteries,
including calcified atherosclerotic plaque in the left main and 3
vessel coronary arteries.
5. Cholelithiasis.

Aortic Atherosclerosis ([EJ]-[EJ]).

## 2019-05-19 ENCOUNTER — Ambulatory Visit (INDEPENDENT_AMBULATORY_CARE_PROVIDER_SITE_OTHER): Payer: Medicare Other | Admitting: Pulmonary Disease

## 2019-05-19 ENCOUNTER — Encounter: Payer: Self-pay | Admitting: Pulmonary Disease

## 2019-05-19 ENCOUNTER — Other Ambulatory Visit: Payer: Self-pay

## 2019-05-19 VITALS — BP 142/80 | HR 62 | Temp 97.5°F | Ht 70.0 in | Wt 310.6 lb

## 2019-05-19 DIAGNOSIS — Z77090 Contact with and (suspected) exposure to asbestos: Secondary | ICD-10-CM

## 2019-05-19 DIAGNOSIS — R0600 Dyspnea, unspecified: Secondary | ICD-10-CM

## 2019-05-19 NOTE — Patient Instructions (Signed)
Your CT scan does not show any evidence of asbestosis There are small lung nodules for which will order a follow-up CT in 1 year and follow-up in clinic after that  In addition there is also evidence of plaque buildup in the heart and gallstones Please follow-up with your primary care at the [facility] regarding these findings.

## 2019-05-19 NOTE — Progress Notes (Signed)
Connor [name]    [id number]    25-Jun-1945  Primary Care Physician:Patient, No Pcp Per  Referring Physician: No referring provider defined for this encounter.  Chief complaint: Evaluation for asbestosis  HPI: 73 year old with history of hypertension, diabetes Self-referred here for evaluation of asbestosis.  States that he has mild dyspnea on exertion which is unchanged over the years.  Was exposed to asbestos while in the [employer] and blocks with a veterans support group in [place] who are helping patients with known asbestos exposure.  Tells me that he had a chest x-ray 10 years ago when he was hospitalized for pneumonia.  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  No mold, hot tub, Jacuzzi.  No down pillows or comforters Smoking history: 5-10 pack-year smoker.  Quit in 1995 Travel history: Originally from [place].  Previously lived in [place].  No significant recent travel Relevant family history: No significant family history of lung disease  Interim history: Here for follow-up of his CT.  States that his breathing is stable with no issues.  Outpatient Encounter Medications as of 05/19/2019  Medication Sig  . aspirin EC 81 MG tablet Take 81 mg by mouth daily.  . dorzolamide-timolol (COSOPT) 22.3-6.8 MG/ML ophthalmic solution Place 1 drop into the left eye 2 (two) times daily.   Marland Kitchen glucose 4 GM chewable tablet Chew 1 tablet by mouth as needed for low blood sugar.  . insulin aspart (NOVOLOG) 100 UNIT/ML injection Inject 8 Units into the skin 3 (three) times daily before meals.   . insulin glargine (LANTUS) 100 UNIT/ML injection Inject 36 Units into the skin at bedtime.  Marland Kitchen losartan (COZAAR) 100 MG tablet Take 100 mg by mouth daily.  . metFORMIN (GLUCOPHAGE) 1000 MG tablet Take 1,000  mg by mouth 2 (two) times daily with a meal.  . Multiple Vitamins-Minerals (MULTIVITAMIN WITH MINERALS) tablet Take 1 tablet by mouth daily.  . prednisoLONE acetate (PRED FORTE) 1 % ophthalmic suspension Place 1 drop into the left eye every 4 (four) hours.  Marland Kitchen SIMVASTATIN PO Take 1 tablet by mouth daily.  Marland Kitchen testosterone enanthate (DELATESTRYL) 200 MG/ML injection Inject 200 mg into the muscle every 28 (twenty-eight) days. Pt does not know how much the dose is  . Vitamin D, Ergocalciferol, (DRISDOL) 1.25 MG (50000 UT) CAPS capsule Take 50,000 Units by mouth 2 (two) times a week.   No facility-administered encounter medications on file as of 05/19/2019.     Allergies as of 05/19/2019  . (No Known Allergies)   Blood pressure (!) 142/80, pulse 62, temperature (!) 97.5 F (36.4 C), temperature source Temporal, height 5\' 10"  (1.778 m), weight (!) 310 lb 9.6 oz (140.9 kg), SpO2 98 %. Gen:      No acute distress HEENT:  EOMI, sclera anicteric Neck:     No masses; no thyromegaly Lungs:    Clear to auscultation bilaterally; normal respiratory effort CV:         Regular rate and rhythm; no murmurs Abd:      + bowel sounds; soft, non-tender; no palpable masses, no distension Ext:    No edema; adequate peripheral perfusion Skin:      Warm and dry; no rash Neuro: alert and oriented x 3 Psych: normal mood and affect  Data Reviewed: High-res  CT 05/10/2019-no evidence of interstitial lung disease or asbestos related disease.  Multiple pulmonary nodules measuring 5 mm, aortic, coronary atherosclerosis, cholelithiasis.  I have reviewed the images personally.  Assessment:  Assessment for asbestosis CT reviewed with no evidence of asbestosis.  Pulmonary nodules Follow-up CT in 1 year  In addition he has findings of coronary atherosclerosis.  I advised him to follow-up with his primary care at [facility] regarding this.  Plan/Recommendations: - CT chest without contrast in 1 year.  Marshell Garfinkel MD  [facility] 05/19/2019, 11:43 AM  CC: No ref. provider found

## 2019-06-18 LAB — HEMOGLOBIN A1C: Hemoglobin A1C: 7.8

## 2019-12-22 ENCOUNTER — Other Ambulatory Visit: Payer: Self-pay

## 2020-01-04 DIAGNOSIS — Z794 Long term (current) use of insulin: Secondary | ICD-10-CM | POA: Diagnosis not present

## 2020-01-04 DIAGNOSIS — I10 Essential (primary) hypertension: Secondary | ICD-10-CM | POA: Diagnosis not present

## 2020-01-04 DIAGNOSIS — E118 Type 2 diabetes mellitus with unspecified complications: Secondary | ICD-10-CM | POA: Diagnosis not present

## 2020-01-04 DIAGNOSIS — E785 Hyperlipidemia, unspecified: Secondary | ICD-10-CM | POA: Diagnosis not present

## 2020-01-04 DIAGNOSIS — Z79899 Other long term (current) drug therapy: Secondary | ICD-10-CM | POA: Diagnosis not present

## 2020-01-18 ENCOUNTER — Ambulatory Visit: Payer: Medicare Other | Admitting: Endocrinology

## 2020-01-26 ENCOUNTER — Encounter: Payer: Self-pay | Admitting: Nurse Practitioner

## 2020-01-31 DIAGNOSIS — M1990 Unspecified osteoarthritis, unspecified site: Secondary | ICD-10-CM | POA: Diagnosis not present

## 2020-01-31 DIAGNOSIS — E119 Type 2 diabetes mellitus without complications: Secondary | ICD-10-CM | POA: Diagnosis not present

## 2020-01-31 DIAGNOSIS — Z6841 Body Mass Index (BMI) 40.0 and over, adult: Secondary | ICD-10-CM | POA: Diagnosis not present

## 2020-01-31 DIAGNOSIS — Z87891 Personal history of nicotine dependence: Secondary | ICD-10-CM | POA: Diagnosis not present

## 2020-01-31 DIAGNOSIS — Z7982 Long term (current) use of aspirin: Secondary | ICD-10-CM | POA: Diagnosis not present

## 2020-01-31 DIAGNOSIS — Z794 Long term (current) use of insulin: Secondary | ICD-10-CM | POA: Diagnosis not present

## 2020-01-31 DIAGNOSIS — I10 Essential (primary) hypertension: Secondary | ICD-10-CM | POA: Diagnosis not present

## 2020-02-02 ENCOUNTER — Encounter: Payer: Self-pay | Admitting: Endocrinology

## 2020-02-02 ENCOUNTER — Ambulatory Visit (INDEPENDENT_AMBULATORY_CARE_PROVIDER_SITE_OTHER): Payer: Medicare HMO | Admitting: Endocrinology

## 2020-02-02 ENCOUNTER — Other Ambulatory Visit: Payer: Self-pay

## 2020-02-02 VITALS — BP 136/80 | HR 65 | Ht 70.0 in | Wt 315.8 lb

## 2020-02-02 DIAGNOSIS — E1142 Type 2 diabetes mellitus with diabetic polyneuropathy: Secondary | ICD-10-CM

## 2020-02-02 DIAGNOSIS — E119 Type 2 diabetes mellitus without complications: Secondary | ICD-10-CM

## 2020-02-02 DIAGNOSIS — Z794 Long term (current) use of insulin: Secondary | ICD-10-CM | POA: Diagnosis not present

## 2020-02-02 LAB — POCT GLYCOSYLATED HEMOGLOBIN (HGB A1C): Hemoglobin A1C: 7.2 % — AB (ref 4.0–5.6)

## 2020-02-02 MED ORDER — DAPAGLIFLOZIN PROPANEDIOL 5 MG PO TABS: 5.0000 mg | ORAL_TABLET | Freq: Every day | ORAL | 11 refills | Status: DC

## 2020-02-02 NOTE — Progress Notes (Signed)
Subjective:    Patient ID: Connor [name], male    DOB: [birth year], 74 y.o.   MRN: [id number]  HPI pt is referred by Dayton Scrape, NP, for diabetes.  Pt states DM was dx'ed in 2003; it is complicated by DR and PN; he has been on insulin since 2008; pt says his diet and exercise are fair; he has never had pancreatic surgery, severe hypoglycemia or DKA.  He had pancreatitis in 2005 (cause was not found).  He takes metformin and multiple daily injections of insulin.  He says cbg varies from 113-150.  He says cbg is lowest in the afternoon, and highest at lunch.   Past Medical History:  Diagnosis Date  . Arthritis    knees  . Diabetes mellitus without complication (HCC)   . Hypertension     No past surgical history on file.  Social History   Socioeconomic History  . Marital status: Unknown    Spouse name: Not on file  . Number of children: Not on file  . Years of education: Not on file  . Highest education level: Not on file  Occupational History  . Not on file  Tobacco Use  . Smoking status: Former Smoker    Packs/day: 0.50    Years: 12.00    Pack years: 6.00    Types: Cigarettes    Quit date: 1995    Years since quitting: 26.6  . Smokeless tobacco: Never Used  Substance and Sexual Activity  . Alcohol use: No  . Drug use: No  . Sexual activity: Not on file  Other Topics Concern  . Not on file  Social History Narrative  . Not on file   Social Determinants of Health   Financial Resource Strain:   . Difficulty of Paying Living Expenses: Not on file  Food Insecurity:   . Worried About Programme researcher, broadcasting/film/video in the Last Year: Not on file  . Ran Out of Food in the Last Year: Not on file  Transportation Needs:   . Lack of Transportation (Medical): Not on file  . Lack of Transportation (Non-Medical): Not on file  Physical Activity:   . Days of Exercise per Week: Not on file  . Minutes of Exercise per Session: Not on file  Stress:   . Feeling of Stress : Not on file   Social Connections:   . Frequency of Communication with Friends and Family: Not on file  . Frequency of Social Gatherings with Friends and Family: Not on file  . Attends Religious Services: Not on file  . Active Member of Clubs or Organizations: Not on file  . Attends Banker Meetings: Not on file  . Marital Status: Not on file  Intimate Partner Violence:   . Fear of Current or Ex-Partner: Not on file  . Emotionally Abused: Not on file  . Physically Abused: Not on file  . Sexually Abused: Not on file    Current Outpatient Medications on File Prior to Visit  Medication Sig Dispense Refill  . aspirin EC 81 MG tablet Take 81 mg by mouth daily.    . brimonidine (ALPHAGAN) 0.15 % ophthalmic solution Place 1 drop into both eyes 2 (two) times daily.     . dorzolamide-timolol (COSOPT) 22.3-6.8 MG/ML ophthalmic solution Place 1 drop into both eyes 2 (two) times daily.     Marland Kitchen glucose 4 GM chewable tablet Chew 1 tablet by mouth as needed for low blood sugar.    . hydrALAZINE (APRESOLINE)  100 MG tablet Take 100 mg by mouth 3 (three) times daily.    . insulin aspart (NOVOLOG) 100 UNIT/ML injection Inject 12 Units into the skin 3 (three) times daily before meals.     . insulin glargine (LANTUS) 100 UNIT/ML injection Inject 50 Units into the skin at bedtime.     Marland Kitchen losartan (COZAAR) 100 MG tablet Take 100 mg by mouth daily.    . metFORMIN (GLUCOPHAGE) 1000 MG tablet Take 1,000 mg by mouth 2 (two) times daily with a meal.    . METOPROLOL TARTRATE PO Take 50 mg by mouth 2 (two) times daily.    . Multiple Vitamins-Minerals (MULTIVITAMIN WITH MINERALS) tablet Take 1 tablet by mouth daily.    Marland Kitchen SIMVASTATIN PO Take 1 tablet by mouth daily.    . Vitamin D, Ergocalciferol, (DRISDOL) 1.25 MG (50000 UT) CAPS capsule Take 50,000 Units by mouth 2 (two) times a week.     No current facility-administered medications on file prior to visit.    No Known Allergies  No family history on file.  BP  136/80   Pulse 65   Ht 5\' 10"  (1.778 m)   Wt (!) 315 lb 12.8 oz (143.2 kg)   SpO2 98%   BMI 45.31 kg/m    Review of Systems denies weight loss, chest pain, sob, n/v, memory loss, and depression.  He has chronic blurry vision.       Objective:   Physical Exam VITAL SIGNS:  See vs page GENERAL: no distress Pulses: dorsalis pedis intact bilat.   MSK: no deformity of the feet CV: 2+ bilat leg edema Skin:  no ulcer on the feet.  normal color and temp on the feet.   Neuro: sensation is intact to touch on the feet.  EXT: left great toenail is absent.     Lab Results  Component Value Date   HGBA1C 7.2 (A) 02/02/2020     I have reviewed outside records, and summarized: Pt was noted to have elevated A1c, and referred here. He was seen multiple times by opthal, for eye injections and ophthalmic meds.       Assessment & Plan:  Insulin-requiring type 2 DM, with PN.  uncontrolled  Patient Instructions  good diet and exercise significantly improve the control of your diabetes.  please let me know if you wish to be referred to a dietician.  high blood sugar is very risky to your health.  you should see an eye doctor and dentist every year.  It is very important to get all recommended vaccinations.  Controlling your blood pressure and cholesterol drastically reduces the damage diabetes does to your body.  Those who smoke should quit.  Please discuss these with your doctor.  check your blood sugar twice a day.  vary the time of day when you check, between before the 3 meals, and at bedtime.  also check if you have symptoms of your blood sugar being too high or too low.  please keep a record of the readings and bring it to your next appointment here (or you can bring the meter itself).  You can write it on any piece of paper.  please call 02/04/2020 sooner if your blood sugar goes below 70, or if you have a lot of readings over 200. I have sent a prescription to your pharmacy, to add "[place]."    Please continue the same other diabetes medications.   Please come back for a follow-up appointment in 6 weeks.

## 2020-02-02 NOTE — Patient Instructions (Addendum)
good diet and exercise significantly improve the control of your diabetes.  please let me know if you wish to be referred to a dietician.  high blood sugar is very risky to your health.  you should see an eye doctor and dentist every year.  It is very important to get all recommended vaccinations.  Controlling your blood pressure and cholesterol drastically reduces the damage diabetes does to your body.  Those who smoke should quit.  Please discuss these with your doctor.  check your blood sugar twice a day.  vary the time of day when you check, between before the 3 meals, and at bedtime.  also check if you have symptoms of your blood sugar being too high or too low.  please keep a record of the readings and bring it to your next appointment here (or you can bring the meter itself).  You can write it on any piece of paper.  please call us sooner if your blood sugar goes below 70, or if you have a lot of readings over 200. I have sent a prescription to your pharmacy, to add "Marcelline Deist."   Please continue the same other diabetes medications.   Please come back for a follow-up appointment in 6 weeks.

## 2020-02-05 DIAGNOSIS — E119 Type 2 diabetes mellitus without complications: Secondary | ICD-10-CM | POA: Insufficient documentation

## 2020-03-02 DIAGNOSIS — H35053 Retinal neovascularization, unspecified, bilateral: Secondary | ICD-10-CM | POA: Diagnosis not present

## 2020-03-02 DIAGNOSIS — H43811 Vitreous degeneration, right eye: Secondary | ICD-10-CM | POA: Diagnosis not present

## 2020-03-02 DIAGNOSIS — H35713 Central serous chorioretinopathy, bilateral: Secondary | ICD-10-CM | POA: Diagnosis not present

## 2020-03-02 DIAGNOSIS — E119 Type 2 diabetes mellitus without complications: Secondary | ICD-10-CM | POA: Diagnosis not present

## 2020-03-03 DIAGNOSIS — E119 Type 2 diabetes mellitus without complications: Secondary | ICD-10-CM | POA: Diagnosis not present

## 2020-03-20 ENCOUNTER — Encounter: Payer: Self-pay | Admitting: Endocrinology

## 2020-03-20 ENCOUNTER — Other Ambulatory Visit: Payer: Self-pay

## 2020-03-20 ENCOUNTER — Ambulatory Visit (INDEPENDENT_AMBULATORY_CARE_PROVIDER_SITE_OTHER): Payer: Medicare HMO | Admitting: Endocrinology

## 2020-03-20 VITALS — BP 132/76 | HR 79 | Ht 70.0 in | Wt 308.4 lb

## 2020-03-20 DIAGNOSIS — Z794 Long term (current) use of insulin: Secondary | ICD-10-CM | POA: Diagnosis not present

## 2020-03-20 DIAGNOSIS — E119 Type 2 diabetes mellitus without complications: Secondary | ICD-10-CM

## 2020-03-20 DIAGNOSIS — E1142 Type 2 diabetes mellitus with diabetic polyneuropathy: Secondary | ICD-10-CM | POA: Diagnosis not present

## 2020-03-20 LAB — POCT GLYCOSYLATED HEMOGLOBIN (HGB A1C): Hemoglobin A1C: 6.5 % — AB (ref 4.0–5.6)

## 2020-03-20 MED ORDER — INSULIN GLARGINE 100 UNIT/ML ~~LOC~~ SOLN: 40.0000 [IU] | Freq: Every day | SUBCUTANEOUS | 11 refills | Status: DC

## 2020-03-20 MED ORDER — DAPAGLIFLOZIN PROPANEDIOL 10 MG PO TABS: 10.0000 mg | ORAL_TABLET | Freq: Every day | ORAL | 3 refills | Status: AC

## 2020-03-20 NOTE — Patient Instructions (Addendum)
check your blood sugar twice a day.  vary the time of day when you check, between before the 3 meals, and at bedtime.  also check if you have symptoms of your blood sugar being too high or too low.  please keep a record of the readings and bring it to your next appointment here (or you can bring the meter itself).  You can write it on any piece of paper.  please call us sooner if your blood sugar goes below 70, or if you have a lot of readings over 200. I have sent a prescription to your pharmacy, to increase the Farxiga, and: Decrease the Lantus to 40 units at bedtime, and: Please continue the same other diabetes medications.   Please come back for a follow-up appointment in 2 months.

## 2020-03-20 NOTE — Progress Notes (Signed)
Subjective:    Patient ID: Connor [name], male    DOB: 1946/05/24, 74 y.o.   MRN: [id number]  HPI Pt returns for f/u of diabetes mellitus: DM type: Insulin-requiring type 2 Dx'ed: 2003 Complications: DR and PN Therapy: insulin since 2008, and metformin.  DKA: never Severe hypoglycemia: never Pancreatitis: 2005 (cause was not found) Pancreatic imaging: none available SDOH: none Other: he takes multiple daily injections Interval history: continuous glucose monitor is downloaded, and scanned into epic.  Glucose varies from 68-230.  It is in general highest in the afternoon, but there is little change throughout the day. Past Medical History:  Diagnosis Date  . Arthritis    knees  . Diabetes mellitus without complication (HCC)   . Hypertension     No past surgical history on file.  Social History   Socioeconomic History  . Marital status: Unknown    Spouse name: Not on file  . Number of children: Not on file  . Years of education: Not on file  . Highest education level: Not on file  Occupational History  . Not on file  Tobacco Use  . Smoking status: Former Smoker    Packs/day: 0.50    Years: 12.00    Pack years: 6.00    Types: Cigarettes    Quit date: 1995    Years since quitting: 26.7  . Smokeless tobacco: Never Used  Substance and Sexual Activity  . Alcohol use: No  . Drug use: No  . Sexual activity: Not on file  Other Topics Concern  . Not on file  Social History Narrative  . Not on file   Social Determinants of Health   Financial Resource Strain:   . Difficulty of Paying Living Expenses: Not on file  Food Insecurity:   . Worried About Programme researcher, broadcasting/film/video in the Last Year: Not on file  . Ran Out of Food in the Last Year: Not on file  Transportation Needs:   . Lack of Transportation (Medical): Not on file  . Lack of Transportation (Non-Medical): Not on file  Physical Activity:   . Days of Exercise per Week: Not on file  . Minutes of Exercise per  Session: Not on file  Stress:   . Feeling of Stress : Not on file  Social Connections:   . Frequency of Communication with Friends and Family: Not on file  . Frequency of Social Gatherings with Friends and Family: Not on file  . Attends Religious Services: Not on file  . Active Member of Clubs or Organizations: Not on file  . Attends Banker Meetings: Not on file  . Marital Status: Not on file  Intimate Partner Violence:   . Fear of Current or Ex-Partner: Not on file  . Emotionally Abused: Not on file  . Physically Abused: Not on file  . Sexually Abused: Not on file    Current Outpatient Medications on File Prior to Visit  Medication Sig Dispense Refill  . aspirin EC 81 MG tablet Take 81 mg by mouth daily.    Marland Kitchen atorvastatin (LIPITOR) 80 MG tablet Take 80 mg by mouth daily.    . brimonidine (ALPHAGAN) 0.15 % ophthalmic solution Place 1 drop into both eyes 2 (two) times daily.     Marland Kitchen glucose 4 GM chewable tablet Chew 1 tablet by mouth as needed for low blood sugar.    . hydrALAZINE (APRESOLINE) 100 MG tablet Take 100 mg by mouth 3 (three) times daily.    Marland Kitchen  insulin aspart (NOVOLOG) 100 UNIT/ML injection Inject 12 Units into the skin 3 (three) times daily before meals.     Marland Kitchen losartan (COZAAR) 100 MG tablet Take 100 mg by mouth daily.    . metFORMIN (GLUCOPHAGE) 1000 MG tablet Take 1,000 mg by mouth 2 (two) times daily with a meal.    . METOPROLOL TARTRATE PO Take 50 mg by mouth 2 (two) times daily.    . Multiple Vitamins-Minerals (MULTIVITAMIN WITH MINERALS) tablet Take 1 tablet by mouth daily.    . Vitamin D, Ergocalciferol, (DRISDOL) 1.25 MG (50000 UT) CAPS capsule Take 50,000 Units by mouth 2 (two) times a week.     No current facility-administered medications on file prior to visit.    No Known Allergies  No family history on file.  BP 132/76   Pulse 79   Ht 5\' 10"  (1.778 m)   Wt (!) 308 lb 6.4 oz (139.9 kg)   SpO2 94%   BMI 44.25 kg/m    Review of  Systems     Objective:   Physical Exam VITAL SIGNS:  See vs page GENERAL: no distress Pulses: dorsalis pedis intact bilat.   MSK: no deformity of the feet CV: 1+ bilat leg edema Skin:  no ulcer on the feet.  normal color and temp on the feet.   Neuro: sensation is intact to touch on the feet.   EXT: left great toenail is absent.      Lab Results  Component Value Date   HGBA1C 6.5 (A) 03/20/2020   outside test results are reviewed: BMET=normal.    Assessment & Plan:  Insulin-requiring type 2 DM, with DN Hypoglycemia, due to insulin: this limits aggressiveness of glycemic control  Patient Instructions  check your blood sugar twice a day.  vary the time of day when you check, between before the 3 meals, and at bedtime.  also check if you have symptoms of your blood sugar being too high or too low.  please keep a record of the readings and bring it to your next appointment here (or you can bring the meter itself).  You can write it on any piece of paper.  please call 05/20/2020 sooner if your blood sugar goes below 70, or if you have a lot of readings over 200. I have sent a prescription to your pharmacy, to increase the Farxiga, and: Decrease the Lantus to 40 units at bedtime, and: Please continue the same other diabetes medications.   Please come back for a follow-up appointment in 2 months.

## 2020-03-30 DIAGNOSIS — E119 Type 2 diabetes mellitus without complications: Secondary | ICD-10-CM | POA: Diagnosis not present

## 2020-03-30 DIAGNOSIS — H35053 Retinal neovascularization, unspecified, bilateral: Secondary | ICD-10-CM | POA: Diagnosis not present

## 2020-03-30 DIAGNOSIS — H43811 Vitreous degeneration, right eye: Secondary | ICD-10-CM | POA: Diagnosis not present

## 2020-03-30 DIAGNOSIS — Z961 Presence of intraocular lens: Secondary | ICD-10-CM | POA: Diagnosis not present

## 2020-03-30 DIAGNOSIS — H35713 Central serous chorioretinopathy, bilateral: Secondary | ICD-10-CM | POA: Diagnosis not present

## 2020-03-30 DIAGNOSIS — H269 Unspecified cataract: Secondary | ICD-10-CM | POA: Diagnosis not present

## 2020-04-02 DIAGNOSIS — E119 Type 2 diabetes mellitus without complications: Secondary | ICD-10-CM | POA: Diagnosis not present

## 2020-04-18 DIAGNOSIS — N401 Enlarged prostate with lower urinary tract symptoms: Secondary | ICD-10-CM | POA: Diagnosis not present

## 2020-04-26 DIAGNOSIS — R3912 Poor urinary stream: Secondary | ICD-10-CM | POA: Diagnosis not present

## 2020-04-26 DIAGNOSIS — N5201 Erectile dysfunction due to arterial insufficiency: Secondary | ICD-10-CM | POA: Diagnosis not present

## 2020-04-26 DIAGNOSIS — N401 Enlarged prostate with lower urinary tract symptoms: Secondary | ICD-10-CM | POA: Diagnosis not present

## 2020-04-26 DIAGNOSIS — R351 Nocturia: Secondary | ICD-10-CM | POA: Diagnosis not present

## 2020-05-03 DIAGNOSIS — E119 Type 2 diabetes mellitus without complications: Secondary | ICD-10-CM | POA: Diagnosis not present

## 2020-05-19 ENCOUNTER — Ambulatory Visit (INDEPENDENT_AMBULATORY_CARE_PROVIDER_SITE_OTHER): Payer: Medicare HMO | Admitting: Endocrinology

## 2020-05-19 ENCOUNTER — Encounter: Payer: Self-pay | Admitting: Endocrinology

## 2020-05-19 ENCOUNTER — Other Ambulatory Visit: Payer: Self-pay

## 2020-05-19 VITALS — BP 128/74 | HR 73 | Ht 70.0 in | Wt 296.0 lb

## 2020-05-19 DIAGNOSIS — Z794 Long term (current) use of insulin: Secondary | ICD-10-CM

## 2020-05-19 DIAGNOSIS — E1142 Type 2 diabetes mellitus with diabetic polyneuropathy: Secondary | ICD-10-CM

## 2020-05-19 LAB — POCT GLYCOSYLATED HEMOGLOBIN (HGB A1C): Hemoglobin A1C: 6.4 % — AB (ref 4.0–5.6)

## 2020-05-19 MED ORDER — INSULIN GLARGINE 100 UNIT/ML ~~LOC~~ SOLN: 35.0000 [IU] | Freq: Every day | SUBCUTANEOUS | 11 refills | Status: AC

## 2020-05-19 NOTE — Patient Instructions (Addendum)
check your blood sugar twice a day.  vary the time of day when you check, between before the 3 meals, and at bedtime.  also check if you have symptoms of your blood sugar being too high or too low.  please keep a record of the readings and bring it to your next appointment here (or you can bring the meter itself).  You can write it on any piece of paper.  please call us sooner if your blood sugar goes below 70, or if you have a lot of readings over 200. Please decrease the Lantus to 35 units at bedtime, and: continue the same other diabetes medications.   Please come back for a follow-up appointment in 3 months.

## 2020-05-19 NOTE — Progress Notes (Signed)
Subjective:    Patient ID: Connor [name], male    DOB: 07-09-1945, 74 y.o.   MRN: [id number]  HPI Pt returns for f/u of diabetes mellitus: DM type: Insulin-requiring type 2 Dx'ed: 2003 Complications: DR and PN Therapy: insulin since 2008, and 2 oral meds DKA: never Severe hypoglycemia: never Pancreatitis: 2005 (cause was not found) Pancreatic imaging: none available SDOH: none Other: he takes multiple daily injections.   Interval history: continuous glucose monitor is downloaded, and scanned into epic. Glucose varies from 80-240.  It is in general highest at 1-3PM.  He has mild hypoglycemia approx QOD.  This usually happens in the afternoon.  Past Medical History:  Diagnosis Date  . Arthritis    knees  . Diabetes mellitus without complication (HCC)   . Hypertension     History reviewed. No pertinent surgical history.  Social History   Socioeconomic History  . Marital status: Unknown    Spouse name: Not on file  . Number of children: Not on file  . Years of education: Not on file  . Highest education level: Not on file  Occupational History  . Not on file  Tobacco Use  . Smoking status: Former Smoker    Packs/day: 0.50    Years: 12.00    Pack years: 6.00    Types: Cigarettes    Quit date: 1995    Years since quitting: 26.9  . Smokeless tobacco: Never Used  Substance and Sexual Activity  . Alcohol use: No  . Drug use: No  . Sexual activity: Not on file  Other Topics Concern  . Not on file  Social History Narrative  . Not on file   Social Determinants of Health   Financial Resource Strain:   . Difficulty of Paying Living Expenses: Not on file  Food Insecurity:   . Worried About Programme researcher, broadcasting/film/video in the Last Year: Not on file  . Ran Out of Food in the Last Year: Not on file  Transportation Needs:   . Lack of Transportation (Medical): Not on file  . Lack of Transportation (Non-Medical): Not on file  Physical Activity:   . Days of Exercise per Week:  Not on file  . Minutes of Exercise per Session: Not on file  Stress:   . Feeling of Stress : Not on file  Social Connections:   . Frequency of Communication with Friends and Family: Not on file  . Frequency of Social Gatherings with Friends and Family: Not on file  . Attends Religious Services: Not on file  . Active Member of Clubs or Organizations: Not on file  . Attends Banker Meetings: Not on file  . Marital Status: Not on file  Intimate Partner Violence:   . Fear of Current or Ex-Partner: Not on file  . Emotionally Abused: Not on file  . Physically Abused: Not on file  . Sexually Abused: Not on file    Current Outpatient Medications on File Prior to Visit  Medication Sig Dispense Refill  . aspirin EC 81 MG tablet Take 81 mg by mouth daily.    Marland Kitchen atorvastatin (LIPITOR) 80 MG tablet Take 80 mg by mouth daily.    . brimonidine (ALPHAGAN) 0.15 % ophthalmic solution Place 1 drop into both eyes 2 (two) times daily.     . dapagliflozin propanediol (FARXIGA) 10 MG TABS tablet Take 1 tablet (10 mg total) by mouth daily before breakfast. 90 tablet 3  . glucose 4 GM chewable tablet Chew 1  tablet by mouth as needed for low blood sugar.    . hydrALAZINE (APRESOLINE) 100 MG tablet Take 100 mg by mouth 3 (three) times daily.    . insulin aspart (NOVOLOG) 100 UNIT/ML injection Inject 12 Units into the skin 3 (three) times daily before meals.     Marland Kitchen losartan (COZAAR) 100 MG tablet Take 100 mg by mouth daily.    . metFORMIN (GLUCOPHAGE) 1000 MG tablet Take 1,000 mg by mouth 2 (two) times daily with a meal.    . METOPROLOL TARTRATE PO Take 50 mg by mouth 2 (two) times daily.    . Multiple Vitamins-Minerals (MULTIVITAMIN WITH MINERALS) tablet Take 1 tablet by mouth daily.    . Vitamin D, Ergocalciferol, (DRISDOL) 1.25 MG (50000 UT) CAPS capsule Take 50,000 Units by mouth 2 (two) times a week.     No current facility-administered medications on file prior to visit.    No Known  Allergies  History reviewed. No pertinent family history.  BP 128/74 (BP Location: Left Arm, Patient Position: Sitting, Cuff Size: Large)   Pulse 73   Ht 5\' 10"  (1.778 m)   Wt 296 lb (134.3 kg)   SpO2 98%   BMI 42.47 kg/m   Review of Systems He has lost a few lbs    Objective:   Physical Exam VITAL SIGNS:  See vs page GENERAL: no distress Pulses: dorsalis pedis intact bilat.   MSK: no deformity of the feet CV: 1+ bilat leg edema Skin:  no ulcer on the feet.  normal color and temp on the feet.   Neuro: sensation is intact to touch on the feet.   EXT: left great toenail is absent.    Lab Results  Component Value Date   HGBA1C 6.4 (A) 05/19/2020       Assessment & Plan:  Insulin-requiring type 2 DM, with DR Hypoglycemia, due to insulin: this limits aggressiveness of glycemic control.  Patient Instructions  check your blood sugar twice a day.  vary the time of day when you check, between before the 3 meals, and at bedtime.  also check if you have symptoms of your blood sugar being too high or too low.  please keep a record of the readings and bring it to your next appointment here (or you can bring the meter itself).  You can write it on any piece of paper.  please call 14/08/2019 sooner if your blood sugar goes below 70, or if you have a lot of readings over 200. Please decrease the Lantus to 35 units at bedtime, and: continue the same other diabetes medications.   Please come back for a follow-up appointment in 3 months.

## 2020-05-25 DIAGNOSIS — H43811 Vitreous degeneration, right eye: Secondary | ICD-10-CM | POA: Diagnosis not present

## 2020-05-25 DIAGNOSIS — Z79899 Other long term (current) drug therapy: Secondary | ICD-10-CM | POA: Diagnosis not present

## 2020-05-25 DIAGNOSIS — H35053 Retinal neovascularization, unspecified, bilateral: Secondary | ICD-10-CM | POA: Diagnosis not present

## 2020-05-25 DIAGNOSIS — Z20822 Contact with and (suspected) exposure to covid-19: Secondary | ICD-10-CM | POA: Diagnosis not present

## 2020-05-25 DIAGNOSIS — H35713 Central serous chorioretinopathy, bilateral: Secondary | ICD-10-CM | POA: Diagnosis not present

## 2020-05-25 DIAGNOSIS — E119 Type 2 diabetes mellitus without complications: Secondary | ICD-10-CM | POA: Diagnosis not present

## 2020-05-25 DIAGNOSIS — H53143 Visual discomfort, bilateral: Secondary | ICD-10-CM | POA: Diagnosis not present

## 2020-06-02 DIAGNOSIS — E119 Type 2 diabetes mellitus without complications: Secondary | ICD-10-CM | POA: Diagnosis not present

## 2020-06-29 DIAGNOSIS — H35713 Central serous chorioretinopathy, bilateral: Secondary | ICD-10-CM | POA: Diagnosis not present

## 2020-06-29 DIAGNOSIS — H43811 Vitreous degeneration, right eye: Secondary | ICD-10-CM | POA: Diagnosis not present

## 2020-06-29 DIAGNOSIS — E119 Type 2 diabetes mellitus without complications: Secondary | ICD-10-CM | POA: Diagnosis not present

## 2020-06-29 DIAGNOSIS — H35053 Retinal neovascularization, unspecified, bilateral: Secondary | ICD-10-CM | POA: Diagnosis not present

## 2020-07-03 DIAGNOSIS — E119 Type 2 diabetes mellitus without complications: Secondary | ICD-10-CM | POA: Diagnosis not present

## 2020-07-11 DIAGNOSIS — M1711 Unilateral primary osteoarthritis, right knee: Secondary | ICD-10-CM | POA: Diagnosis not present

## 2020-07-11 DIAGNOSIS — E291 Testicular hypofunction: Secondary | ICD-10-CM | POA: Diagnosis not present

## 2020-07-11 DIAGNOSIS — E559 Vitamin D deficiency, unspecified: Secondary | ICD-10-CM | POA: Diagnosis not present

## 2020-07-11 DIAGNOSIS — E785 Hyperlipidemia, unspecified: Secondary | ICD-10-CM | POA: Diagnosis not present

## 2020-07-12 DIAGNOSIS — E785 Hyperlipidemia, unspecified: Secondary | ICD-10-CM | POA: Diagnosis not present

## 2020-07-12 DIAGNOSIS — E291 Testicular hypofunction: Secondary | ICD-10-CM | POA: Diagnosis not present

## 2020-07-12 DIAGNOSIS — Z20822 Contact with and (suspected) exposure to covid-19: Secondary | ICD-10-CM | POA: Diagnosis not present

## 2020-07-20 DIAGNOSIS — H43811 Vitreous degeneration, right eye: Secondary | ICD-10-CM | POA: Diagnosis not present

## 2020-07-20 DIAGNOSIS — E119 Type 2 diabetes mellitus without complications: Secondary | ICD-10-CM | POA: Diagnosis not present

## 2020-07-20 DIAGNOSIS — H35053 Retinal neovascularization, unspecified, bilateral: Secondary | ICD-10-CM | POA: Diagnosis not present

## 2020-07-20 DIAGNOSIS — H35713 Central serous chorioretinopathy, bilateral: Secondary | ICD-10-CM | POA: Diagnosis not present

## 2020-07-25 DIAGNOSIS — Z20822 Contact with and (suspected) exposure to covid-19: Secondary | ICD-10-CM | POA: Diagnosis not present

## 2020-08-03 DIAGNOSIS — E119 Type 2 diabetes mellitus without complications: Secondary | ICD-10-CM | POA: Diagnosis not present

## 2020-08-22 ENCOUNTER — Other Ambulatory Visit: Payer: Self-pay

## 2020-08-24 ENCOUNTER — Ambulatory Visit (INDEPENDENT_AMBULATORY_CARE_PROVIDER_SITE_OTHER): Payer: Medicare HMO | Admitting: Endocrinology

## 2020-08-24 ENCOUNTER — Other Ambulatory Visit: Payer: Self-pay

## 2020-08-24 VITALS — BP 150/70 | HR 67 | Ht 70.0 in | Wt 292.8 lb

## 2020-08-24 DIAGNOSIS — E1142 Type 2 diabetes mellitus with diabetic polyneuropathy: Secondary | ICD-10-CM

## 2020-08-24 DIAGNOSIS — Z794 Long term (current) use of insulin: Secondary | ICD-10-CM | POA: Diagnosis not present

## 2020-08-24 LAB — POCT GLYCOSYLATED HEMOGLOBIN (HGB A1C): Hemoglobin A1C: 6.9 % — AB (ref 4.0–5.6)

## 2020-08-24 MED ORDER — NOVOLOG FLEXPEN 100 UNIT/ML ~~LOC~~ SOPN: PEN_INJECTOR | SUBCUTANEOUS | 3 refills | Status: DC

## 2020-08-24 MED ORDER — METFORMIN HCL ER 500 MG PO TB24: 2000.0000 mg | ORAL_TABLET | Freq: Every day | ORAL | 3 refills | Status: AC

## 2020-08-24 MED ORDER — NOVOLOG FLEXPEN 100 UNIT/ML ~~LOC~~ SOPN: PEN_INJECTOR | SUBCUTANEOUS | 3 refills | Status: AC

## 2020-08-24 NOTE — Progress Notes (Signed)
Subjective:    Patient ID: Connor [name], male    DOB: 12/10/1945, 75 y.o.   MRN: [id number]  HPI Pt returns for f/u of diabetes mellitus: DM type: Insulin-requiring type 2 Dx'ed: 2003 Complications: DR and PN Therapy: insulin since 2008, and 2 oral meds.   DKA: never Severe hypoglycemia: never Pancreatitis: 2005 (cause was not found) Pancreatic imaging: none available SDOH: none Other: he takes multiple daily injections.   Interval history: continuous glucose monitor is downloaded, and scanned into epic. Data go from 2AM-5PM.  Glucose varies from 90-280.  It is in general higher as the day goes on. He has intermitt diarrhea.  He seldom has hypoglycemia, and these episodes are mild.  This usually happens in the afternoon.   Past Medical History:  Diagnosis Date  . Arthritis    knees  . Diabetes mellitus without complication (HCC)   . Hypertension     No past surgical history on file.  Social History   Socioeconomic History  . Marital status: Unknown    Spouse name: Not on file  . Number of children: Not on file  . Years of education: Not on file  . Highest education level: Not on file  Occupational History  . Not on file  Tobacco Use  . Smoking status: Former Smoker    Packs/day: 0.50    Years: 12.00    Pack years: 6.00    Types: Cigarettes    Quit date: 1995    Years since quitting: 27.2  . Smokeless tobacco: Never Used  Substance and Sexual Activity  . Alcohol use: No  . Drug use: No  . Sexual activity: Not on file  Other Topics Concern  . Not on file  Social History Narrative  . Not on file   Social Determinants of Health   Financial Resource Strain: Not on file  Food Insecurity: Not on file  Transportation Needs: Not on file  Physical Activity: Not on file  Stress: Not on file  Social Connections: Not on file  Intimate Partner Violence: Not on file    Current Outpatient Medications on File Prior to Visit  Medication Sig Dispense Refill  .  aspirin EC 81 MG tablet Take 81 mg by mouth daily.    Marland Kitchen atorvastatin (LIPITOR) 80 MG tablet Take 80 mg by mouth daily.    . brimonidine (ALPHAGAN) 0.15 % ophthalmic solution Place 1 drop into both eyes 2 (two) times daily.     . dapagliflozin propanediol (FARXIGA) 10 MG TABS tablet Take 1 tablet (10 mg total) by mouth daily before breakfast. 90 tablet 3  . glucose 4 GM chewable tablet Chew 1 tablet by mouth as needed for low blood sugar.    . hydrALAZINE (APRESOLINE) 100 MG tablet Take 100 mg by mouth 3 (three) times daily.    . insulin glargine (LANTUS) 100 UNIT/ML injection Inject 0.35 mLs (35 Units total) into the skin at bedtime. 10 mL 11  . losartan (COZAAR) 100 MG tablet Take 100 mg by mouth daily.    Marland Kitchen METOPROLOL TARTRATE PO Take 50 mg by mouth 2 (two) times daily.    . Multiple Vitamins-Minerals (MULTIVITAMIN WITH MINERALS) tablet Take 1 tablet by mouth daily.    . Vitamin D, Ergocalciferol, (DRISDOL) 1.25 MG (50000 UT) CAPS capsule Take 50,000 Units by mouth 2 (two) times a week.     No current facility-administered medications on file prior to visit.    No Known Allergies  No family history on file.  BP (!) 150/70 (BP Location: Right Arm, Patient Position: Sitting, Cuff Size: Large)   Pulse 67   Ht 5\' 10"  (1.778 m)   Wt 292 lb 12.8 oz (132.8 kg)   SpO2 96%   BMI 42.01 kg/m    Review of Systems     Objective:   Physical Exam VITAL SIGNS:  See vs page GENERAL: no distress Pulses: dorsalis pedis intact bilat.   MSK: no deformity of the feet CV: 1+ bilat leg edema Skin:  no ulcer on the feet.  normal color and temp on the feet. Neuro: sensation is intact to touch on the feet  Lab Results  Component Value Date   HGBA1C 6.9 (A) 08/24/2020        Assessment & Plan:  HTN: is noted today Insulin-requiring type 2 DM, with DR Hypoglycemia, due to insulin: this limits aggressiveness of glycemic control Diarrhea, prob due to metformin.   Patient Instructions  Your  blood pressure is high today.  Please see your primary care provider soon, to have it rechecked check your blood sugar twice a day.  vary the time of day when you check, between before the 3 meals, and at bedtime.  also check if you have symptoms of your blood sugar being too high or too low.  please keep a record of the readings and bring it to your next appointment here (or you can bring the meter itself).  You can write it on any piece of paper.  please call 10/24/2020 sooner if your blood sugar goes below 70, or if you have a lot of readings over 200. I have sent 2 prescription to your pharmacies: to change the metformin to extended-release, and to reduce the Novolog to 3 times a day (just before each meal) 05-27-11 units continue the same other diabetes medications.   Please come back for a follow-up appointment in 3 months.

## 2020-08-24 NOTE — Patient Instructions (Addendum)
Your blood pressure is high today.  Please see your primary care provider soon, to have it rechecked check your blood sugar twice a day.  vary the time of day when you check, between before the 3 meals, and at bedtime.  also check if you have symptoms of your blood sugar being too high or too low.  please keep a record of the readings and bring it to your next appointment here (or you can bring the meter itself).  You can write it on any piece of paper.  please call us sooner if your blood sugar goes below 70, or if you have a lot of readings over 200. I have sent 2 prescription to your pharmacies: to change the metformin to extended-release, and to reduce the Novolog to 3 times a day (just before each meal) 05-27-11 units continue the same other diabetes medications.   Please come back for a follow-up appointment in 3 months.

## 2020-08-31 DIAGNOSIS — E119 Type 2 diabetes mellitus without complications: Secondary | ICD-10-CM | POA: Diagnosis not present

## 2020-09-06 DIAGNOSIS — G8929 Other chronic pain: Secondary | ICD-10-CM | POA: Diagnosis not present

## 2020-09-06 DIAGNOSIS — M1990 Unspecified osteoarthritis, unspecified site: Secondary | ICD-10-CM | POA: Diagnosis not present

## 2020-09-06 DIAGNOSIS — E11319 Type 2 diabetes mellitus with unspecified diabetic retinopathy without macular edema: Secondary | ICD-10-CM | POA: Diagnosis not present

## 2020-09-06 DIAGNOSIS — E291 Testicular hypofunction: Secondary | ICD-10-CM | POA: Diagnosis not present

## 2020-09-06 DIAGNOSIS — I10 Essential (primary) hypertension: Secondary | ICD-10-CM | POA: Diagnosis not present

## 2020-09-06 DIAGNOSIS — Z794 Long term (current) use of insulin: Secondary | ICD-10-CM | POA: Diagnosis not present

## 2020-09-06 DIAGNOSIS — Z6841 Body Mass Index (BMI) 40.0 and over, adult: Secondary | ICD-10-CM | POA: Diagnosis not present

## 2020-09-06 DIAGNOSIS — E785 Hyperlipidemia, unspecified: Secondary | ICD-10-CM | POA: Diagnosis not present

## 2020-09-06 DIAGNOSIS — J309 Allergic rhinitis, unspecified: Secondary | ICD-10-CM | POA: Diagnosis not present

## 2020-10-01 DIAGNOSIS — E119 Type 2 diabetes mellitus without complications: Secondary | ICD-10-CM | POA: Diagnosis not present

## 2020-10-18 ENCOUNTER — Telehealth: Payer: Self-pay | Admitting: Endocrinology

## 2020-10-18 ENCOUNTER — Encounter (HOSPITAL_COMMUNITY): Payer: Self-pay

## 2020-10-18 ENCOUNTER — Ambulatory Visit (HOSPITAL_COMMUNITY)
Admission: EM | Admit: 2020-10-18 | Discharge: 2020-10-18 | Disposition: A | Discharge status: Home / Self Care | Payer: Medicare HMO | Attending: Emergency Medicine | Admitting: Emergency Medicine

## 2020-10-18 ENCOUNTER — Other Ambulatory Visit: Payer: Self-pay

## 2020-10-18 DIAGNOSIS — E119 Type 2 diabetes mellitus without complications: Secondary | ICD-10-CM

## 2020-10-18 LAB — CBG MONITORING, ED: Glucose-Capillary: 125 mg/dL — ABNORMAL HIGH (ref 70–99)

## 2020-10-18 NOTE — Telephone Encounter (Signed)
Pt is in ER now.  They will reduce insulin.  Please move up next appt to next week.

## 2020-10-18 NOTE — Discharge Instructions (Addendum)
If you have any low blood sugars or any other concerns, return or go to the Emergency Department.    Follow up with your primary care/endocrinologist.

## 2020-10-18 NOTE — Telephone Encounter (Signed)
Patient icalling - he has a BS reading of 48 at 2pm and now has BS reading that just says low. This is from [facility]. Requested that PT do a finger stink but does not know where his regular meter is currently.   Call back # [phone]

## 2020-10-18 NOTE — Telephone Encounter (Signed)
Patient has taken 2 glucose tablets and [facility] is still reading low - since patient is unable to check any other way advised to go to Urgent Care or ED

## 2020-10-18 NOTE — ED Provider Notes (Signed)
[facility]    CSN: [id number] Arrival date & time: 10/18/20  1618      History   Chief Complaint Chief Complaint  Patient presents with  . Hypoglycemia    HPI Connor [name] is a 75 y.o. male.   Patient here for blood sugar check.  Reports using a continuous blood glucose monitor but hit it against the wall and believes that it is not reading correctly.  Reports checking blood sugar around 1 and glucose read low.  Patient repeated blood sugars several times and every time blood sugar read low.  Patient denies any shaking or symptoms of hypoglycemia.  Patient reports that he can normally tell when he is hypoglycemic. Denies any trauma, injury, or other precipitating event.  Denies any specific alleviating or aggravating factors.  Denies any fevers, chest pain, shortness of breath, N/V/D, numbness, tingling, weakness, abdominal pain, or headaches.   ROS: As per HPI, all other pertinent ROS negative   The history is provided by the patient.    Past Medical History:  Diagnosis Date  . Arthritis    knees  . Diabetes mellitus without complication (HCC)   . Hypertension     Patient Active Problem List   Diagnosis Date Noted  . Diabetes (HCC) 02/05/2020    History reviewed. No pertinent surgical history.     Home Medications    Prior to Admission medications   Medication Sig Start Date End Date Taking? Authorizing Provider  aspirin EC 81 MG tablet Take 81 mg by mouth daily.    [provider]  atorvastatin (LIPITOR) 80 MG tablet Take 80 mg by mouth daily.    [provider]  brimonidine (ALPHAGAN) 0.15 % ophthalmic solution Place 1 drop into both eyes 2 (two) times daily.  11/18/19 11/17/20  [provider]  dapagliflozin propanediol (FARXIGA) 10 MG TABS tablet Take 1 tablet (10 mg total) by mouth daily before breakfast. 03/20/20   Romero Belling, MD  glucose 4 GM chewable tablet Chew 1 tablet by mouth as needed for low blood sugar.     [provider]  hydrALAZINE (APRESOLINE) 100 MG tablet Take 100 mg by mouth 3 (three) times daily.    [provider]  insulin aspart (NOVOLOG FLEXPEN) 100 UNIT/ML FlexPen 3 times a day (just before each meal) 05-27-11 units 08/24/20   Romero Belling, MD  insulin glargine (LANTUS) 100 UNIT/ML injection Inject 0.35 mLs (35 Units total) into the skin at bedtime. 05/19/20   Romero Belling, MD  losartan (COZAAR) 100 MG tablet Take 100 mg by mouth daily.    [provider]  metFORMIN (GLUCOPHAGE-XR) 500 MG 24 hr tablet Take 4 tablets (2,000 mg total) by mouth daily with breakfast. 08/24/20   Romero Belling, MD  METOPROLOL TARTRATE PO Take 50 mg by mouth 2 (two) times daily.    [provider]  Multiple Vitamins-Minerals (MULTIVITAMIN WITH MINERALS) tablet Take 1 tablet by mouth daily.    [provider]  Vitamin D, Ergocalciferol, (DRISDOL) 1.25 MG (50000 UT) CAPS capsule Take 50,000 Units by mouth 2 (two) times a week. 04/23/19   [provider]    Family History History reviewed. No pertinent family history.  Social History Social History   Tobacco Use  . Smoking status: Former Smoker    Packs/day: 0.50    Years: 12.00    Pack years: 6.00    Types: Cigarettes    Quit date: 1995    Years since quitting: 27.3  .  Smokeless tobacco: Never Used  Substance Use Topics  . Alcohol use: No  . Drug use: No     Allergies   Patient has no known allergies.   Review of Systems Review of Systems  All other systems reviewed and are negative.    Physical Exam Triage Vital Signs ED Triage Vitals  Enc Vitals Group     BP 10/18/20 1641 (!) 173/78     Pulse Rate 10/18/20 1638 74     Resp 10/18/20 1638 20     Temp 10/18/20 1638 98.3 F (36.8 C)     Temp src --      SpO2 10/18/20 1638 97 %     Weight --      Height --      Head Circumference --      Peak Flow --      Pain Score 10/18/20 1638 0     Pain Loc --      Pain Edu? --       Excl. in GC? --    No data found.  Updated Vital Signs BP (!) 173/78   Pulse 74   Temp 98.3 F (36.8 C)   Resp 20   SpO2 97%   Visual Acuity Right Eye Distance:   Left Eye Distance:   Bilateral Distance:    Right Eye Near:   Left Eye Near:    Bilateral Near:     Physical Exam Vitals and nursing note reviewed.  Constitutional:      General: He is not in acute distress.    Appearance: Normal appearance. He is not ill-appearing, toxic-appearing or diaphoretic.  HENT:     Head: Normocephalic and atraumatic.  Eyes:     Conjunctiva/sclera: Conjunctivae normal.  Cardiovascular:     Rate and Rhythm: Normal rate.     Pulses: Normal pulses.  Pulmonary:     Effort: Pulmonary effort is normal.  Abdominal:     General: Abdomen is flat.  Musculoskeletal:        General: Normal range of motion.     Cervical back: Normal range of motion.  Skin:    General: Skin is warm and dry.  Neurological:     General: No focal deficit present.     Mental Status: He is alert and oriented to person, place, and time.  Psychiatric:        Mood and Affect: Mood normal.      UC Treatments / Results  Labs (all labs ordered are listed, but only abnormal results are displayed) Labs Reviewed  CBG MONITORING, ED - Abnormal; Notable for the following components:      Result Value   Glucose-Capillary 125 (*)    All other components within normal limits    EKG   Radiology No results found.  Procedures Procedures (including critical care time)  Medications Ordered in UC Medications - No data to display  Initial Impression / Assessment and Plan / UC Course  I have reviewed the triage vital signs and the nursing notes.  Pertinent labs & imaging results that were available during my care of the patient were reviewed by me and considered in my medical decision making (see chart for details).     Assessment negative for red flags or concerns.  CBG 125 in office.  Low blood sugar  readings at home are likely a result of machine malfunction.  Patient told to come in for reevaluation if he develops symptomatic low blood sugars.  Otherwise patient  to follow-up with endocrinology.  Final Clinical Impressions(s) / UC Diagnoses   Final diagnoses:  Type 2 diabetes mellitus without complication, unspecified whether long term insulin use (HCC)     Discharge Instructions     If you have any low blood sugars or any other concerns, return or go to the Emergency Department.    Follow up with your primary care/endocrinologist.    ED Prescriptions    None     PDMP not reviewed this encounter.   Ivette Loyal, NP 10/18/20 1724

## 2020-10-18 NOTE — ED Triage Notes (Signed)
Pt states when he checked his blood sugar today it read 48 around 1 pm, pt then rechecked his blood sugar and the glucometer read Low   Pt denies any weakness, shakiness, or AMS

## 2020-10-19 DIAGNOSIS — Z125 Encounter for screening for malignant neoplasm of prostate: Secondary | ICD-10-CM | POA: Diagnosis not present

## 2020-10-19 NOTE — Telephone Encounter (Signed)
Attempted to call patient, no answer, LMTCB to move his appointment up to next May 9-15

## 2020-10-26 DIAGNOSIS — R3915 Urgency of urination: Secondary | ICD-10-CM | POA: Diagnosis not present

## 2020-10-26 DIAGNOSIS — R3912 Poor urinary stream: Secondary | ICD-10-CM | POA: Diagnosis not present

## 2020-10-26 DIAGNOSIS — N401 Enlarged prostate with lower urinary tract symptoms: Secondary | ICD-10-CM | POA: Diagnosis not present

## 2020-10-31 DIAGNOSIS — E119 Type 2 diabetes mellitus without complications: Secondary | ICD-10-CM | POA: Diagnosis not present

## 2020-11-27 DIAGNOSIS — Z20822 Contact with and (suspected) exposure to covid-19: Secondary | ICD-10-CM | POA: Diagnosis not present

## 2020-11-28 DIAGNOSIS — E785 Hyperlipidemia, unspecified: Secondary | ICD-10-CM | POA: Diagnosis not present

## 2020-11-28 DIAGNOSIS — E113299 Type 2 diabetes mellitus with mild nonproliferative diabetic retinopathy without macular edema, unspecified eye: Secondary | ICD-10-CM | POA: Diagnosis not present

## 2020-11-28 DIAGNOSIS — E291 Testicular hypofunction: Secondary | ICD-10-CM | POA: Diagnosis not present

## 2020-11-28 DIAGNOSIS — E113293 Type 2 diabetes mellitus with mild nonproliferative diabetic retinopathy without macular edema, bilateral: Secondary | ICD-10-CM | POA: Diagnosis not present

## 2020-11-28 DIAGNOSIS — J302 Other seasonal allergic rhinitis: Secondary | ICD-10-CM | POA: Diagnosis not present

## 2020-11-28 DIAGNOSIS — Z136 Encounter for screening for cardiovascular disorders: Secondary | ICD-10-CM | POA: Diagnosis not present

## 2020-11-28 DIAGNOSIS — Z6841 Body Mass Index (BMI) 40.0 and over, adult: Secondary | ICD-10-CM | POA: Diagnosis not present

## 2020-11-28 DIAGNOSIS — N400 Enlarged prostate without lower urinary tract symptoms: Secondary | ICD-10-CM | POA: Diagnosis not present

## 2020-11-28 DIAGNOSIS — I10 Essential (primary) hypertension: Secondary | ICD-10-CM | POA: Diagnosis not present

## 2020-11-28 DIAGNOSIS — Z0000 Encounter for general adult medical examination without abnormal findings: Secondary | ICD-10-CM | POA: Diagnosis not present

## 2020-12-01 DIAGNOSIS — E119 Type 2 diabetes mellitus without complications: Secondary | ICD-10-CM | POA: Diagnosis not present

## 2020-12-05 ENCOUNTER — Ambulatory Visit (INDEPENDENT_AMBULATORY_CARE_PROVIDER_SITE_OTHER): Payer: Medicare HMO | Admitting: Endocrinology

## 2020-12-05 ENCOUNTER — Encounter: Payer: Self-pay | Admitting: Endocrinology

## 2020-12-05 ENCOUNTER — Other Ambulatory Visit: Payer: Self-pay

## 2020-12-05 VITALS — BP 150/78 | HR 78 | Ht 70.0 in | Wt 292.0 lb

## 2020-12-05 DIAGNOSIS — E1142 Type 2 diabetes mellitus with diabetic polyneuropathy: Secondary | ICD-10-CM

## 2020-12-05 DIAGNOSIS — Z20822 Contact with and (suspected) exposure to covid-19: Secondary | ICD-10-CM | POA: Diagnosis not present

## 2020-12-05 DIAGNOSIS — Z794 Long term (current) use of insulin: Secondary | ICD-10-CM

## 2020-12-05 NOTE — Patient Instructions (Addendum)
check your blood sugar twice a day.  vary the time of day when you check, between before the 3 meals, and at bedtime.  also check if you have symptoms of your blood sugar being too high or too low.  please keep a record of the readings and bring it to your next appointment here (or you can bring the meter itself).  You can write it on any piece of paper.  please call us sooner if your blood sugar goes below 70, or if you have a lot of readings over 200. I have sent 2 prescription to your pharmacies: to change the metformin to extended-release, and to reduce the Novolog to 3 times a day (just before each meal) 12-(6-12, depending on activity)-12 units.  This range at lunch will help you smooth out the blood sugar in the afternoon.   continue the same other diabetes medications.   Please come back for a follow-up appointment in 3 months.

## 2020-12-05 NOTE — Progress Notes (Signed)
Subjective:    Patient ID: Connor [name], male    DOB: 05/04/1946, 75 y.o.   MRN: [id number]  HPI Pt returns for f/u of diabetes mellitus:  DM type: Insulin-requiring type 2 Dx'ed: 2003 Complications: DR and PN Therapy: insulin since 2008, and 2 oral meds.   DKA: never Severe hypoglycemia: never Pancreatitis: 2005 (no cause found) Pancreatic imaging: none available.  SDOH: none Other: he takes multiple daily injections.   Interval history: we are unable to access continuous glucose monitor data today.  Pt says glucose is both highest and lowest in the afternoon, depending on activity.  He has hypoglycemia almost qd, but these episodes are mild.  This still usually happens in the afternoon.   Past Medical History:  Diagnosis Date   Arthritis    knees   Diabetes mellitus without complication (HCC)    Hypertension     No past surgical history on file.  Social History   Socioeconomic History   Marital status: Unknown    Spouse name: Not on file   Number of children: Not on file   Years of education: Not on file   Highest education level: Not on file  Occupational History   Not on file  Tobacco Use   Smoking status: Former    Packs/day: 0.50    Years: 12.00    Pack years: 6.00    Types: Cigarettes    Quit date: 57    Years since quitting: 27.4   Smokeless tobacco: Never  Substance and Sexual Activity   Alcohol use: No   Drug use: No   Sexual activity: Not on file  Other Topics Concern   Not on file  Social History Narrative   Not on file   Social Determinants of Health   Financial Resource Strain: Not on file  Food Insecurity: Not on file  Transportation Needs: Not on file  Physical Activity: Not on file  Stress: Not on file  Social Connections: Not on file  Intimate Partner Violence: Not on file    Current Outpatient Medications on File Prior to Visit  Medication Sig Dispense Refill   aspirin EC 81 MG tablet Take 81 mg by mouth daily.      atorvastatin (LIPITOR) 80 MG tablet Take 80 mg by mouth daily.     dapagliflozin propanediol (FARXIGA) 10 MG TABS tablet Take 1 tablet (10 mg total) by mouth daily before breakfast. 90 tablet 3   glucose 4 GM chewable tablet Chew 1 tablet by mouth as needed for low blood sugar.     hydrALAZINE (APRESOLINE) 100 MG tablet Take 100 mg by mouth 3 (three) times daily.     insulin aspart (NOVOLOG FLEXPEN) 100 UNIT/ML FlexPen 3 times a day (just before each meal) 05-27-11 units 45 mL 3   insulin glargine (LANTUS) 100 UNIT/ML injection Inject 0.35 mLs (35 Units total) into the skin at bedtime. 10 mL 11   losartan (COZAAR) 100 MG tablet Take 100 mg by mouth daily.     metFORMIN (GLUCOPHAGE-XR) 500 MG 24 hr tablet Take 4 tablets (2,000 mg total) by mouth daily with breakfast. 360 tablet 3   METOPROLOL TARTRATE PO Take 50 mg by mouth 2 (two) times daily.     Multiple Vitamins-Minerals (MULTIVITAMIN WITH MINERALS) tablet Take 1 tablet by mouth daily.     Vitamin D, Ergocalciferol, (DRISDOL) 1.25 MG (50000 UT) CAPS capsule Take 50,000 Units by mouth 2 (two) times a week.     No current facility-administered medications  on file prior to visit.    No Known Allergies  No family history on file.  BP (!) 150/78   Pulse 78   Ht 5\' 10"  (1.778 m)   Wt 292 lb (132.5 kg)   SpO2 94%   BMI 41.90 kg/m    Review of Systems     Objective:   Physical Exam Pulses: dorsalis pedis intact bilat.   MSK: no deformity of the feet CV: trace bilat leg edema.  Skin:  no ulcer on the feet.  normal color and temp on the feet.  Neuro: sensation is intact to touch on the feet.    A1c=7.4%    Assessment & Plan:  Insulin-requiring type 2 DM Hypoglycemia, due to insulin.  Patient Instructions  check your blood sugar twice a day.  vary the time of day when you check, between before the 3 meals, and at bedtime.  also check if you have symptoms of your blood sugar being too high or too low.  please keep a record of the  readings and bring it to your next appointment here (or you can bring the meter itself).  You can write it on any piece of paper.  please call sooner if your blood sugar goes below 70, or if you have a lot of readings over 200. I have sent 2 prescription to your pharmacies: to change the metformin to extended-release, and to reduce the Novolog to 3 times a day (just before each meal) 12-(6-12, depending on activity)-12 units.  This range at lunch will help you smooth out the blood sugar in the afternoon.   continue the same other diabetes medications.   Please come back for a follow-up appointment in 3 months.

## 2020-12-07 ENCOUNTER — Ambulatory Visit: Payer: Medicare HMO | Admitting: Endocrinology

## 2020-12-12 DIAGNOSIS — Z20822 Contact with and (suspected) exposure to covid-19: Secondary | ICD-10-CM | POA: Diagnosis not present

## 2020-12-15 DIAGNOSIS — R3914 Feeling of incomplete bladder emptying: Secondary | ICD-10-CM | POA: Diagnosis not present

## 2020-12-15 DIAGNOSIS — R3912 Poor urinary stream: Secondary | ICD-10-CM | POA: Diagnosis not present

## 2020-12-15 DIAGNOSIS — N401 Enlarged prostate with lower urinary tract symptoms: Secondary | ICD-10-CM | POA: Diagnosis not present

## 2020-12-21 DIAGNOSIS — Z20822 Contact with and (suspected) exposure to covid-19: Secondary | ICD-10-CM | POA: Diagnosis not present

## 2020-12-26 DIAGNOSIS — Z20822 Contact with and (suspected) exposure to covid-19: Secondary | ICD-10-CM | POA: Diagnosis not present

## 2020-12-31 DIAGNOSIS — E119 Type 2 diabetes mellitus without complications: Secondary | ICD-10-CM | POA: Diagnosis not present

## 2021-01-02 DIAGNOSIS — Z20822 Contact with and (suspected) exposure to covid-19: Secondary | ICD-10-CM | POA: Diagnosis not present

## 2021-01-16 DIAGNOSIS — Z20822 Contact with and (suspected) exposure to covid-19: Secondary | ICD-10-CM | POA: Diagnosis not present

## 2021-01-16 DIAGNOSIS — E291 Testicular hypofunction: Secondary | ICD-10-CM | POA: Diagnosis not present

## 2021-01-16 DIAGNOSIS — Z6841 Body Mass Index (BMI) 40.0 and over, adult: Secondary | ICD-10-CM | POA: Diagnosis not present

## 2021-01-16 DIAGNOSIS — E559 Vitamin D deficiency, unspecified: Secondary | ICD-10-CM | POA: Diagnosis not present

## 2021-01-24 DIAGNOSIS — Z20822 Contact with and (suspected) exposure to covid-19: Secondary | ICD-10-CM | POA: Diagnosis not present

## 2021-01-31 DIAGNOSIS — E119 Type 2 diabetes mellitus without complications: Secondary | ICD-10-CM | POA: Diagnosis not present

## 2021-03-03 DIAGNOSIS — E119 Type 2 diabetes mellitus without complications: Secondary | ICD-10-CM | POA: Diagnosis not present

## 2021-03-05 ENCOUNTER — Emergency Department (HOSPITAL_COMMUNITY): Payer: Medicare HMO

## 2021-03-05 ENCOUNTER — Inpatient Hospital Stay (HOSPITAL_COMMUNITY)
Admission: EM | Admit: 2021-03-05 | Discharge: 2021-03-17 | DRG: 064 | Disposition: E | Payer: Medicare HMO | Attending: Critical Care Medicine | Admitting: Critical Care Medicine

## 2021-03-05 ENCOUNTER — Inpatient Hospital Stay (HOSPITAL_COMMUNITY): Payer: Medicare HMO

## 2021-03-05 ENCOUNTER — Other Ambulatory Visit: Payer: Self-pay

## 2021-03-05 DIAGNOSIS — I959 Hypotension, unspecified: Secondary | ICD-10-CM | POA: Diagnosis not present

## 2021-03-05 DIAGNOSIS — N1831 Chronic kidney disease, stage 3a: Secondary | ICD-10-CM | POA: Diagnosis not present

## 2021-03-05 DIAGNOSIS — I6523 Occlusion and stenosis of bilateral carotid arteries: Secondary | ICD-10-CM | POA: Diagnosis not present

## 2021-03-05 DIAGNOSIS — I6389 Other cerebral infarction: Secondary | ICD-10-CM | POA: Diagnosis not present

## 2021-03-05 DIAGNOSIS — Z6841 Body Mass Index (BMI) 40.0 and over, adult: Secondary | ICD-10-CM | POA: Diagnosis not present

## 2021-03-05 DIAGNOSIS — Z4659 Encounter for fitting and adjustment of other gastrointestinal appliance and device: Secondary | ICD-10-CM

## 2021-03-05 DIAGNOSIS — R4182 Altered mental status, unspecified: Secondary | ICD-10-CM | POA: Diagnosis not present

## 2021-03-05 DIAGNOSIS — K6389 Other specified diseases of intestine: Secondary | ICD-10-CM | POA: Diagnosis not present

## 2021-03-05 DIAGNOSIS — E1165 Type 2 diabetes mellitus with hyperglycemia: Secondary | ICD-10-CM | POA: Diagnosis not present

## 2021-03-05 DIAGNOSIS — I674 Hypertensive encephalopathy: Secondary | ICD-10-CM | POA: Diagnosis present

## 2021-03-05 DIAGNOSIS — Z20822 Contact with and (suspected) exposure to covid-19: Secondary | ICD-10-CM | POA: Diagnosis present

## 2021-03-05 DIAGNOSIS — I129 Hypertensive chronic kidney disease with stage 1 through stage 4 chronic kidney disease, or unspecified chronic kidney disease: Secondary | ICD-10-CM | POA: Diagnosis not present

## 2021-03-05 DIAGNOSIS — J9602 Acute respiratory failure with hypercapnia: Secondary | ICD-10-CM | POA: Diagnosis not present

## 2021-03-05 DIAGNOSIS — G4733 Obstructive sleep apnea (adult) (pediatric): Secondary | ICD-10-CM | POA: Diagnosis present

## 2021-03-05 DIAGNOSIS — E870 Hyperosmolality and hypernatremia: Secondary | ICD-10-CM | POA: Diagnosis not present

## 2021-03-05 DIAGNOSIS — I604 Nontraumatic subarachnoid hemorrhage from basilar artery: Principal | ICD-10-CM | POA: Diagnosis present

## 2021-03-05 DIAGNOSIS — J811 Chronic pulmonary edema: Secondary | ICD-10-CM | POA: Diagnosis not present

## 2021-03-05 DIAGNOSIS — I609 Nontraumatic subarachnoid hemorrhage, unspecified: Secondary | ICD-10-CM

## 2021-03-05 DIAGNOSIS — G9340 Encephalopathy, unspecified: Secondary | ICD-10-CM | POA: Diagnosis not present

## 2021-03-05 DIAGNOSIS — E785 Hyperlipidemia, unspecified: Secondary | ICD-10-CM | POA: Diagnosis not present

## 2021-03-05 DIAGNOSIS — E876 Hypokalemia: Secondary | ICD-10-CM | POA: Diagnosis present

## 2021-03-05 DIAGNOSIS — R140 Abdominal distension (gaseous): Secondary | ICD-10-CM

## 2021-03-05 DIAGNOSIS — J9601 Acute respiratory failure with hypoxia: Secondary | ICD-10-CM | POA: Diagnosis not present

## 2021-03-05 DIAGNOSIS — Z743 Need for continuous supervision: Secondary | ICD-10-CM | POA: Diagnosis not present

## 2021-03-05 DIAGNOSIS — E860 Dehydration: Secondary | ICD-10-CM | POA: Diagnosis present

## 2021-03-05 DIAGNOSIS — N170 Acute kidney failure with tubular necrosis: Secondary | ICD-10-CM | POA: Diagnosis not present

## 2021-03-05 DIAGNOSIS — I469 Cardiac arrest, cause unspecified: Secondary | ICD-10-CM | POA: Diagnosis not present

## 2021-03-05 DIAGNOSIS — Z9119 Patient's noncompliance with other medical treatment and regimen: Secondary | ICD-10-CM

## 2021-03-05 DIAGNOSIS — E875 Hyperkalemia: Secondary | ICD-10-CM | POA: Diagnosis not present

## 2021-03-05 DIAGNOSIS — K567 Ileus, unspecified: Secondary | ICD-10-CM | POA: Diagnosis not present

## 2021-03-05 DIAGNOSIS — E1122 Type 2 diabetes mellitus with diabetic chronic kidney disease: Secondary | ICD-10-CM | POA: Diagnosis not present

## 2021-03-05 DIAGNOSIS — E872 Acidosis: Secondary | ICD-10-CM | POA: Diagnosis not present

## 2021-03-05 DIAGNOSIS — R519 Headache, unspecified: Secondary | ICD-10-CM | POA: Diagnosis not present

## 2021-03-05 DIAGNOSIS — I468 Cardiac arrest due to other underlying condition: Secondary | ICD-10-CM | POA: Diagnosis not present

## 2021-03-05 DIAGNOSIS — Z7984 Long term (current) use of oral hypoglycemic drugs: Secondary | ICD-10-CM

## 2021-03-05 DIAGNOSIS — R404 Transient alteration of awareness: Secondary | ICD-10-CM | POA: Diagnosis not present

## 2021-03-05 DIAGNOSIS — G4489 Other headache syndrome: Secondary | ICD-10-CM | POA: Diagnosis not present

## 2021-03-05 DIAGNOSIS — Z7982 Long term (current) use of aspirin: Secondary | ICD-10-CM

## 2021-03-05 DIAGNOSIS — Z978 Presence of other specified devices: Secondary | ICD-10-CM

## 2021-03-05 DIAGNOSIS — R58 Hemorrhage, not elsewhere classified: Secondary | ICD-10-CM | POA: Diagnosis not present

## 2021-03-05 DIAGNOSIS — J13 Pneumonia due to Streptococcus pneumoniae: Secondary | ICD-10-CM | POA: Diagnosis present

## 2021-03-05 DIAGNOSIS — Z794 Long term (current) use of insulin: Secondary | ICD-10-CM

## 2021-03-05 DIAGNOSIS — N179 Acute kidney failure, unspecified: Secondary | ICD-10-CM | POA: Diagnosis not present

## 2021-03-05 DIAGNOSIS — Z0189 Encounter for other specified special examinations: Secondary | ICD-10-CM

## 2021-03-05 DIAGNOSIS — Z4682 Encounter for fitting and adjustment of non-vascular catheter: Secondary | ICD-10-CM | POA: Diagnosis not present

## 2021-03-05 DIAGNOSIS — N281 Cyst of kidney, acquired: Secondary | ICD-10-CM | POA: Diagnosis not present

## 2021-03-05 DIAGNOSIS — Z79899 Other long term (current) drug therapy: Secondary | ICD-10-CM

## 2021-03-05 DIAGNOSIS — I615 Nontraumatic intracerebral hemorrhage, intraventricular: Secondary | ICD-10-CM | POA: Diagnosis not present

## 2021-03-05 DIAGNOSIS — I161 Hypertensive emergency: Secondary | ICD-10-CM | POA: Diagnosis not present

## 2021-03-05 DIAGNOSIS — R0902 Hypoxemia: Secondary | ICD-10-CM | POA: Diagnosis not present

## 2021-03-05 DIAGNOSIS — J9811 Atelectasis: Secondary | ICD-10-CM | POA: Diagnosis not present

## 2021-03-05 DIAGNOSIS — R29810 Facial weakness: Secondary | ICD-10-CM | POA: Diagnosis not present

## 2021-03-05 DIAGNOSIS — I619 Nontraumatic intracerebral hemorrhage, unspecified: Secondary | ICD-10-CM | POA: Diagnosis present

## 2021-03-05 DIAGNOSIS — Z87891 Personal history of nicotine dependence: Secondary | ICD-10-CM

## 2021-03-05 LAB — DIFFERENTIAL
Abs Immature Granulocytes: 0.06 10*3/uL (ref 0.00–0.07)
Basophils Absolute: 0.1 10*3/uL (ref 0.0–0.1)
Basophils Relative: 1 %
Eosinophils Absolute: 0.3 10*3/uL (ref 0.0–0.5)
Eosinophils Relative: 3 %
Immature Granulocytes: 1 %
Lymphocytes Relative: 39 %
Lymphs Abs: 3.8 10*3/uL (ref 0.7–4.0)
Monocytes Absolute: 1.1 10*3/uL — ABNORMAL HIGH (ref 0.1–1.0)
Monocytes Relative: 11 %
Neutro Abs: 4.5 10*3/uL (ref 1.7–7.7)
Neutrophils Relative %: 45 %

## 2021-03-05 LAB — I-STAT ARTERIAL BLOOD GAS, ED
Acid-Base Excess: 2 mmol/L (ref 0.0–2.0)
Bicarbonate: 27.8 mmol/L (ref 20.0–28.0)
Calcium, Ion: 1.28 mmol/L (ref 1.15–1.40)
HCT: 51 % (ref 39.0–52.0)
Hemoglobin: 17.3 g/dL — ABNORMAL HIGH (ref 13.0–17.0)
O2 Saturation: 100 %
Patient temperature: 98.2
Potassium: 3.5 mmol/L (ref 3.5–5.1)
Sodium: 143 mmol/L (ref 135–145)
TCO2: 29 mmol/L (ref 22–32)
pCO2 arterial: 47.8 mmHg (ref 32.0–48.0)
pH, Arterial: 7.373 (ref 7.350–7.450)
pO2, Arterial: 353 mmHg — ABNORMAL HIGH (ref 83.0–108.0)

## 2021-03-05 LAB — COMPREHENSIVE METABOLIC PANEL
ALT: 9 U/L (ref 0–44)
AST: 30 U/L (ref 15–41)
Albumin: 3.6 g/dL (ref 3.5–5.0)
Alkaline Phosphatase: 52 U/L (ref 38–126)
Anion gap: 13 (ref 5–15)
BUN: 11 mg/dL (ref 8–23)
CO2: 21 mmol/L — ABNORMAL LOW (ref 22–32)
Calcium: 9.3 mg/dL (ref 8.9–10.3)
Chloride: 103 mmol/L (ref 98–111)
Creatinine, Ser: 1.39 mg/dL — ABNORMAL HIGH (ref 0.61–1.24)
GFR, Estimated: 54 mL/min — ABNORMAL LOW (ref >60)
Glucose, Bld: 239 mg/dL — ABNORMAL HIGH (ref 70–99)
Potassium: 2.8 mmol/L — ABNORMAL LOW (ref 3.5–5.1)
Sodium: 137 mmol/L (ref 135–145)
Total Bilirubin: 1 mg/dL (ref 0.3–1.2)
Total Protein: 6.5 g/dL (ref 6.5–8.1)

## 2021-03-05 LAB — SODIUM
Sodium: 141 mmol/L (ref 135–145)
Sodium: 143 mmol/L (ref 135–145)

## 2021-03-05 LAB — RAPID URINE DRUG SCREEN, HOSP PERFORMED
Amphetamines: NOT DETECTED
Barbiturates: NOT DETECTED
Benzodiazepines: NOT DETECTED
Cocaine: NOT DETECTED
Opiates: NOT DETECTED
Tetrahydrocannabinol: NOT DETECTED

## 2021-03-05 LAB — I-STAT CHEM 8, ED
BUN: 12 mg/dL (ref 8–23)
Calcium, Ion: 1.26 mmol/L (ref 1.15–1.40)
Chloride: 104 mmol/L (ref 98–111)
Creatinine, Ser: 1.2 mg/dL (ref 0.61–1.24)
Glucose, Bld: 235 mg/dL — ABNORMAL HIGH (ref 70–99)
HCT: 54 % — ABNORMAL HIGH (ref 39.0–52.0)
Hemoglobin: 18.4 g/dL — ABNORMAL HIGH (ref 13.0–17.0)
Potassium: 2.7 mmol/L — CL (ref 3.5–5.1)
Sodium: 143 mmol/L (ref 135–145)
TCO2: 24 mmol/L (ref 22–32)

## 2021-03-05 LAB — MAGNESIUM: Magnesium: 1.9 mg/dL (ref 1.7–2.4)

## 2021-03-05 LAB — RESP PANEL BY RT-PCR (FLU A&B, COVID) ARPGX2
Influenza A by PCR: NEGATIVE
Influenza B by PCR: NEGATIVE
SARS Coronavirus 2 by RT PCR: NEGATIVE

## 2021-03-05 LAB — BASIC METABOLIC PANEL
Anion gap: 12 (ref 5–15)
BUN: 11 mg/dL (ref 8–23)
CO2: 23 mmol/L (ref 22–32)
Calcium: 9.4 mg/dL (ref 8.9–10.3)
Chloride: 105 mmol/L (ref 98–111)
Creatinine, Ser: 1.34 mg/dL — ABNORMAL HIGH (ref 0.61–1.24)
GFR, Estimated: 56 mL/min — ABNORMAL LOW (ref >60)
Glucose, Bld: 247 mg/dL — ABNORMAL HIGH (ref 70–99)
Potassium: 3.6 mmol/L (ref 3.5–5.1)
Sodium: 140 mmol/L (ref 135–145)

## 2021-03-05 LAB — GLUCOSE, CAPILLARY
Glucose-Capillary: 242 mg/dL — ABNORMAL HIGH (ref 70–99)
Glucose-Capillary: 251 mg/dL — ABNORMAL HIGH (ref 70–99)
Glucose-Capillary: 214 mg/dL — ABNORMAL HIGH (ref 70–99)
Glucose-Capillary: 163 mg/dL — ABNORMAL HIGH (ref 70–99)
Glucose-Capillary: 176 mg/dL — ABNORMAL HIGH (ref 70–99)

## 2021-03-05 LAB — CBC
HCT: 54.7 % — ABNORMAL HIGH (ref 39.0–52.0)
Hemoglobin: 17.3 g/dL — ABNORMAL HIGH (ref 13.0–17.0)
MCH: 31.7 pg (ref 26.0–34.0)
MCHC: 31.6 g/dL (ref 30.0–36.0)
MCV: 100.2 fL — ABNORMAL HIGH (ref 80.0–100.0)
Platelets: 195 10*3/uL (ref 150–400)
RBC: 5.46 MIL/uL (ref 4.22–5.81)
RDW: 13.5 % (ref 11.5–15.5)
WBC: 9.8 10*3/uL (ref 4.0–10.5)
nRBC: 0 % (ref 0.0–0.2)

## 2021-03-05 LAB — MRSA NEXT GEN BY PCR, NASAL: MRSA by PCR Next Gen: NOT DETECTED

## 2021-03-05 LAB — HEMOGLOBIN A1C
Hgb A1c MFr Bld: 7.2 % — ABNORMAL HIGH (ref 4.8–5.6)
Mean Plasma Glucose: 159.94 mg/dL

## 2021-03-05 LAB — APTT: aPTT: 26 s (ref 24–36)

## 2021-03-05 LAB — PROTIME-INR
INR: 1 (ref 0.8–1.2)
Prothrombin Time: 13.4 s (ref 11.4–15.2)

## 2021-03-05 LAB — ETHANOL: Alcohol, Ethyl (B): <10 mg/dL (ref <10)

## 2021-03-05 LAB — PHOSPHORUS: Phosphorus: 1.9 mg/dL — ABNORMAL LOW (ref 2.5–4.6)

## 2021-03-05 LAB — C-REACTIVE PROTEIN: CRP: 0.6 mg/dL (ref <1.0)

## 2021-03-05 LAB — CBG MONITORING, ED: Glucose-Capillary: 225 mg/dL — ABNORMAL HIGH (ref 70–99)

## 2021-03-05 IMAGING — DX DG ABD PORTABLE 1V
1 series · 1 of 1 positions shown · non-contrast
Comparison: None.

CLINICAL DATA: Orogastric tube placement.

EXAM:
PORTABLE ABDOMEN - 1 VIEW

[abdomen]
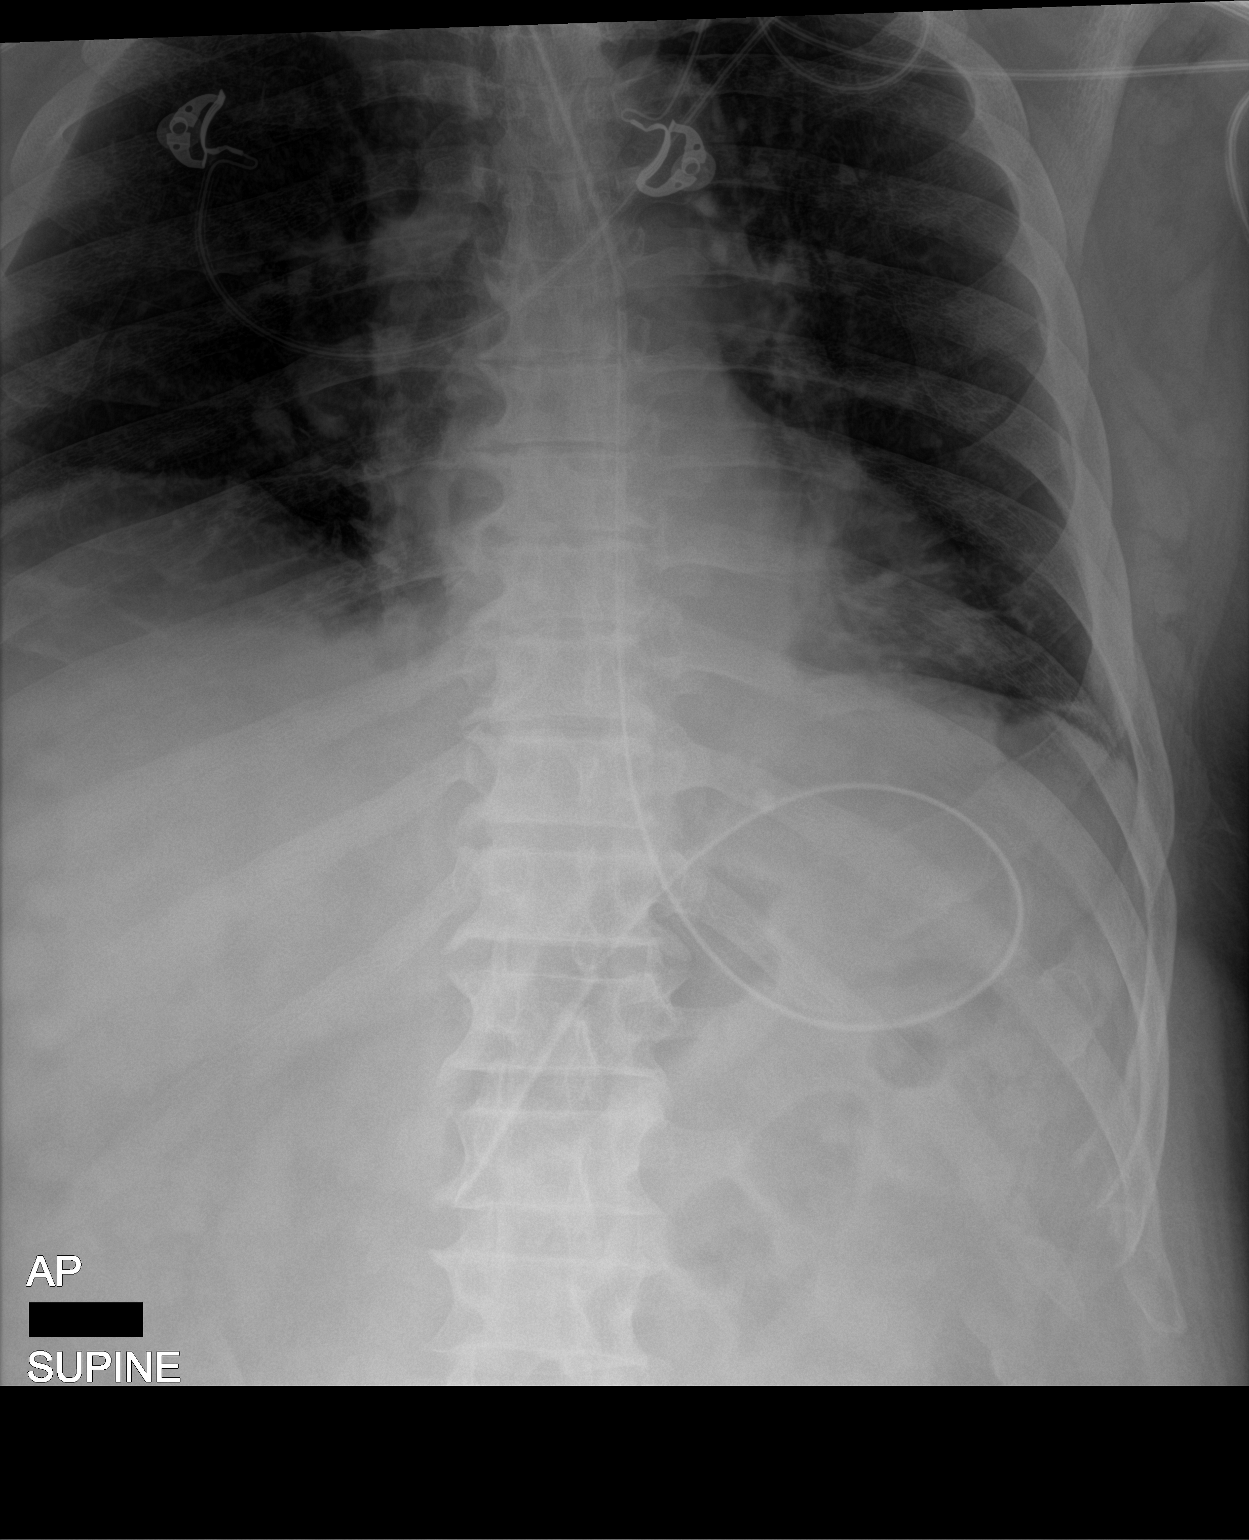

[1 of 1 positions shown; findings below may reference images not displayed]

FINDINGS: The bowel gas pattern is normal. Distal tip of nasogastric tube is
seen in expected position of distal stomach. No radio-opaque calculi
or other significant radiographic abnormality are seen.
IMPRESSION: Distal tip of nasogastric tube is seen in expected position of
distal stomach.

## 2021-03-05 IMAGING — DX DG CHEST 1V PORT
1 series · 1 of 1 positions shown · non-contrast
Comparison: None

CLINICAL DATA: Intubated

EXAM:
PORTABLE CHEST 1 VIEW

[chest ap]
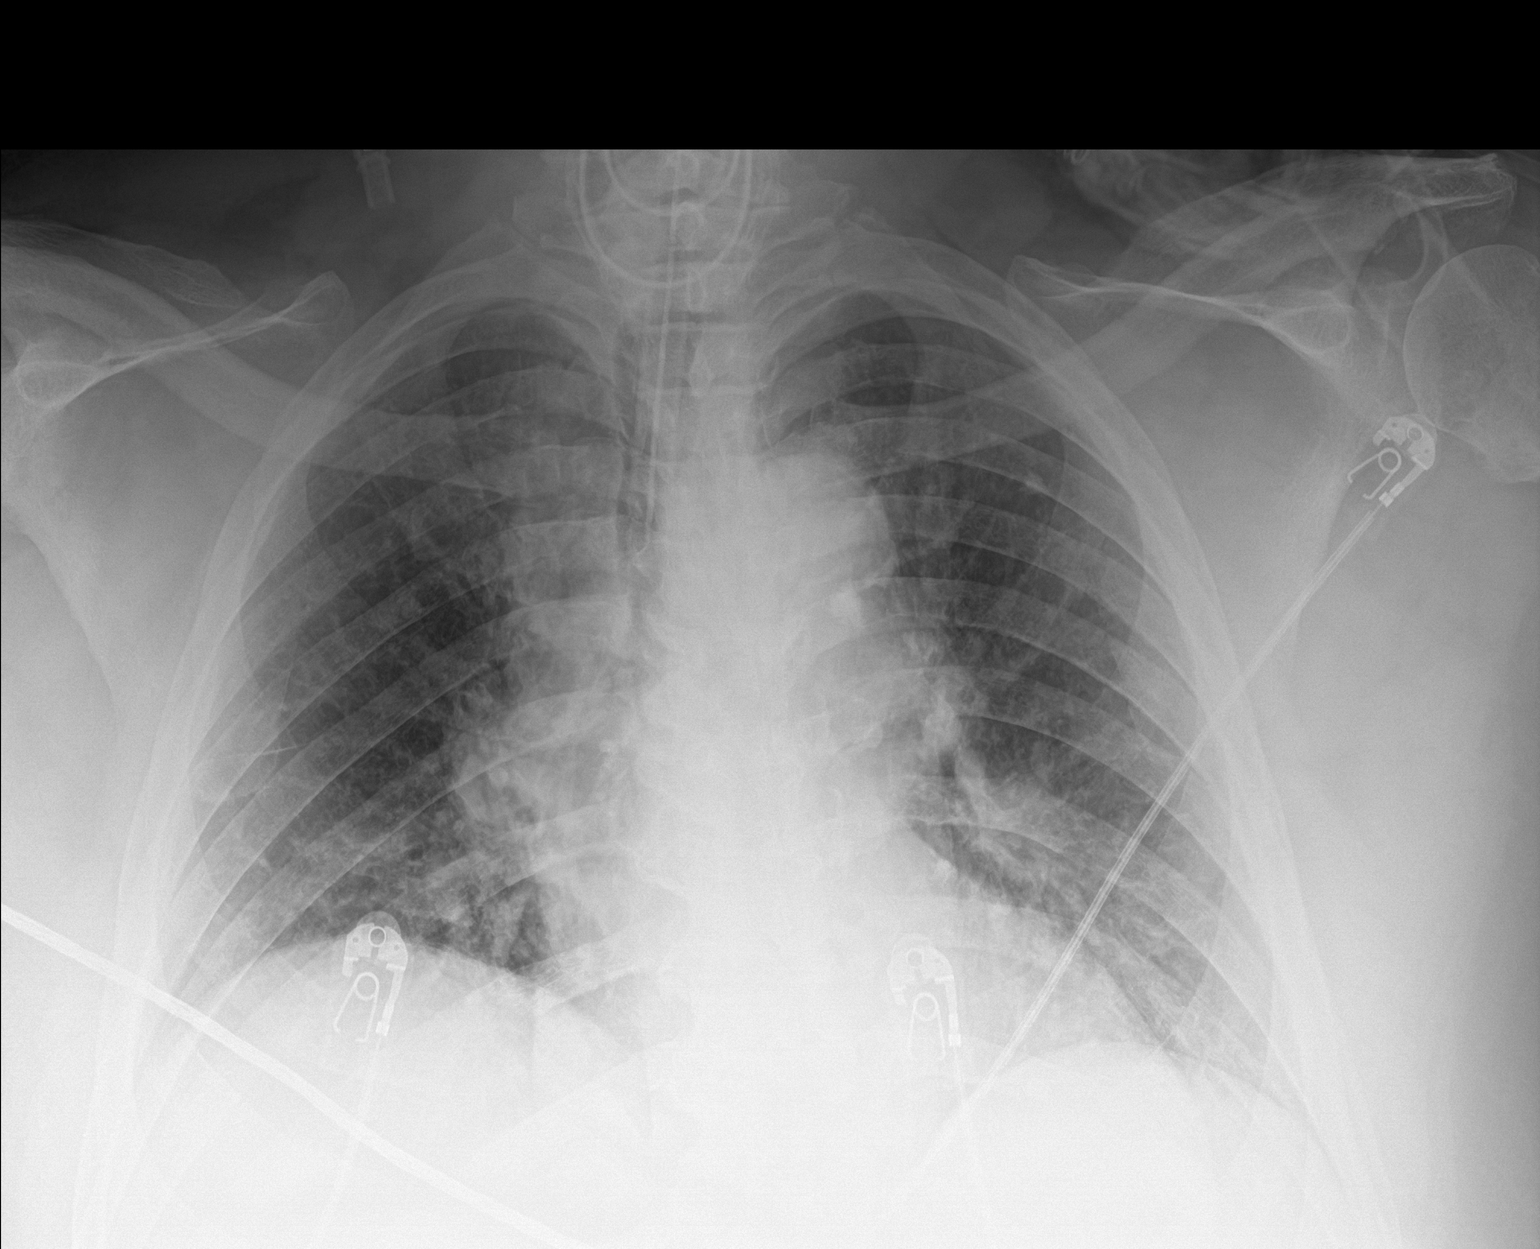

[1 of 1 positions shown; findings below may reference images not displayed]

FINDINGS: Endotracheal tube is 3 cm above the carina. Possible enteric tube
coiled in the pharynx. Low lung volumes with crowding of markings.
No consolidation or edema. No pleural effusion. Normal heart size.
IMPRESSION: Endotracheal tube in place. Possible enteric tube coiled in the
pharynx. No acute abnormality.

## 2021-03-05 IMAGING — CT CT ANGIO HEAD-NECK (W OR W/O PERF)
1 of 8 series · 14 of 47 positions shown · IV contrast (omnipaque)
Comparison: None.

CLINICAL DATA: Neuro deficit, acute, stroke suspected

EXAM:
CT ANGIOGRAPHY HEAD AND NECK
TECHNIQUE: Multidetector CT imaging of the head and neck was performed using
the standard protocol during bolus administration of intravenous
contrast. Multiplanar CT image reconstructions and MIPs were
obtained to evaluate the vascular anatomy. Carotid stenosis
measurements (when applicable) are obtained utilizing NASCET
criteria, using the distal internal carotid diameter as the
denominator.
CONTRAST:  75 mL Omnipaque 350

[Series 6: thin · axial · 0.49mm/px · z∈[-224,+55]mm · 14 of 643 slices shown]
[im 43/643  brain]
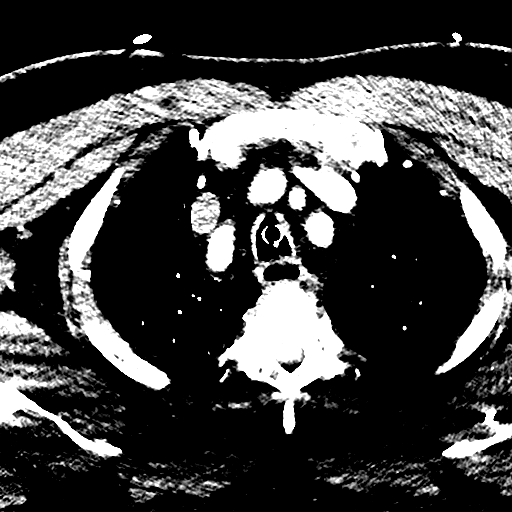
[im 86/643  bone]
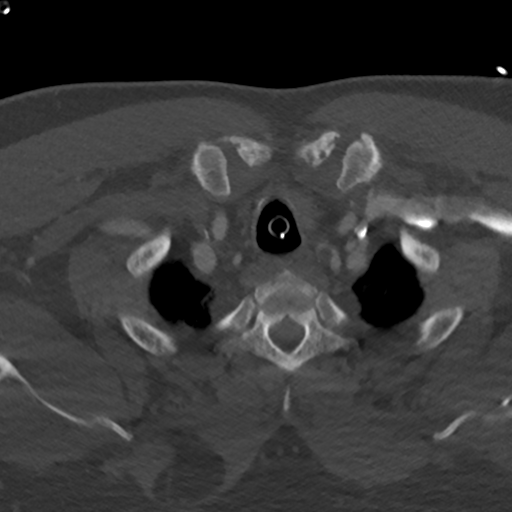
[im 129/643  brain]
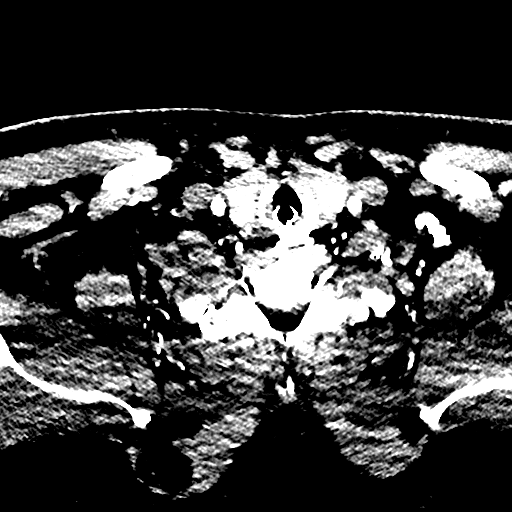
[im 172/643  bone]
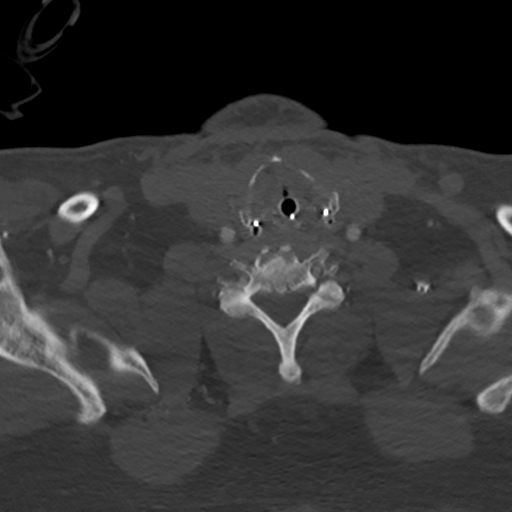
[im 215/643  brain]
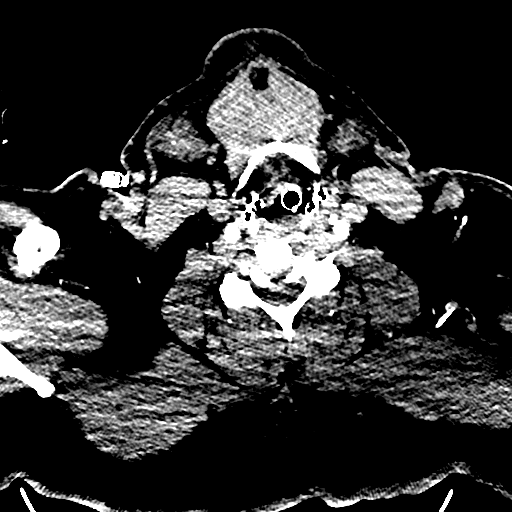
[im 257/643  bone]
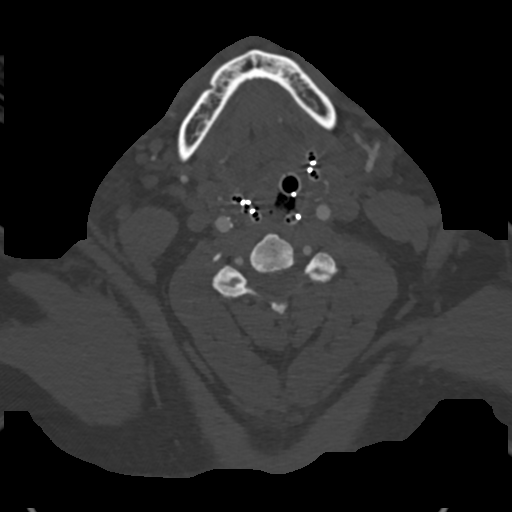
[im 300/643  brain]
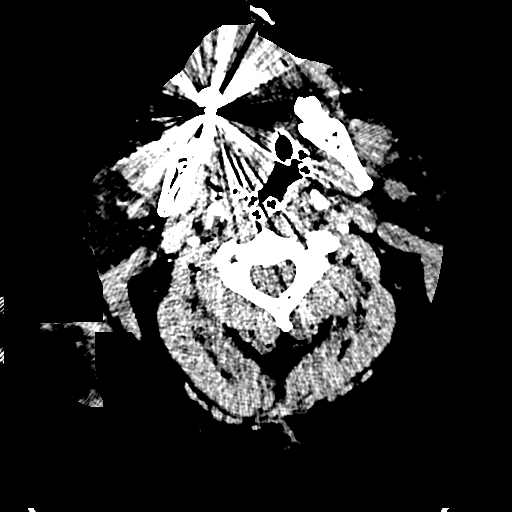
[im 343/643  bone]
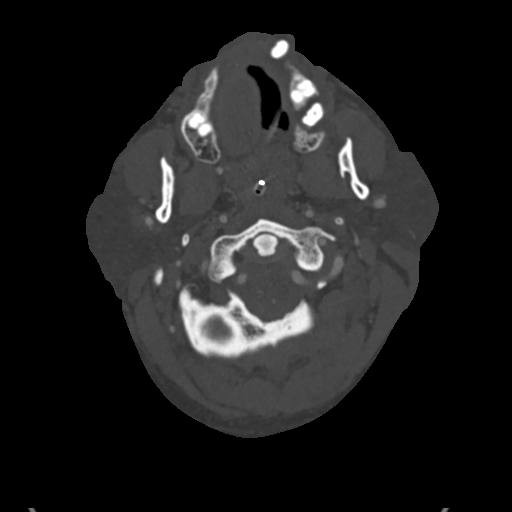
[im 386/643  brain]
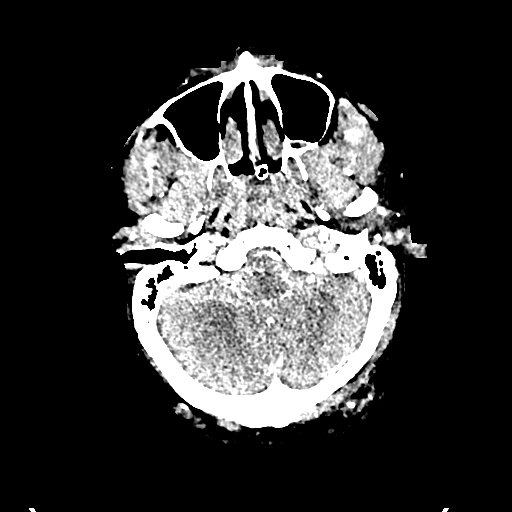
[im 429/643  bone]
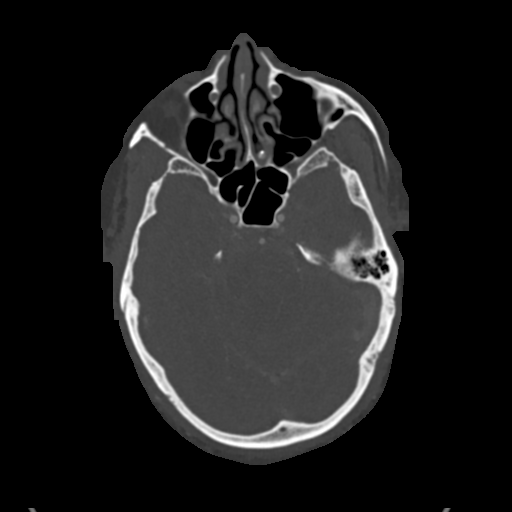
[im 471/643  brain]
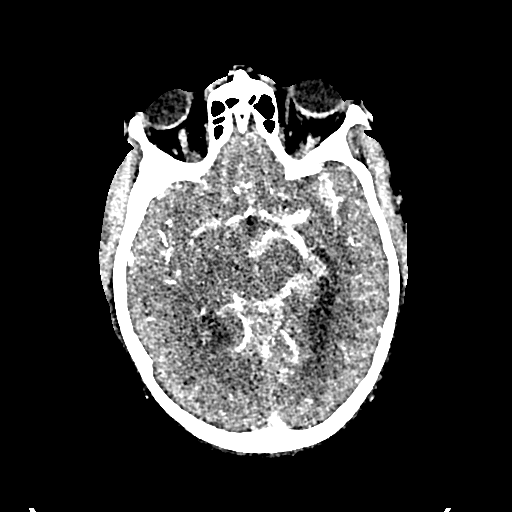
[im 514/643  bone]
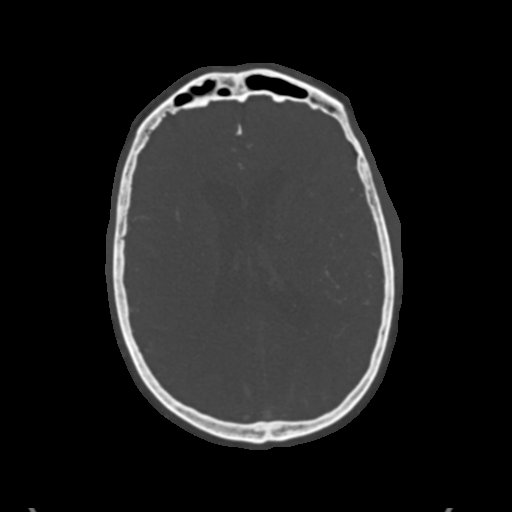
[im 557/643  brain]
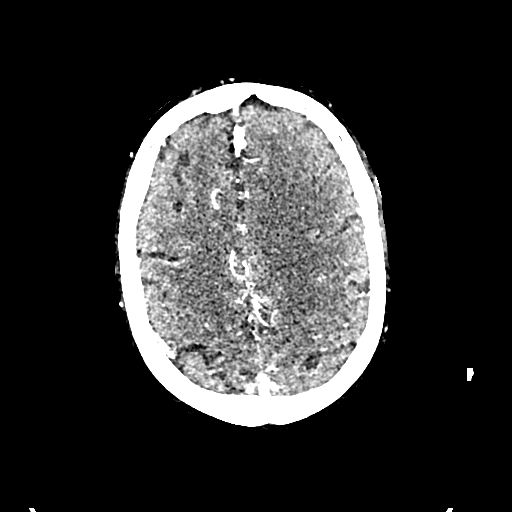
[im 600/643  bone]
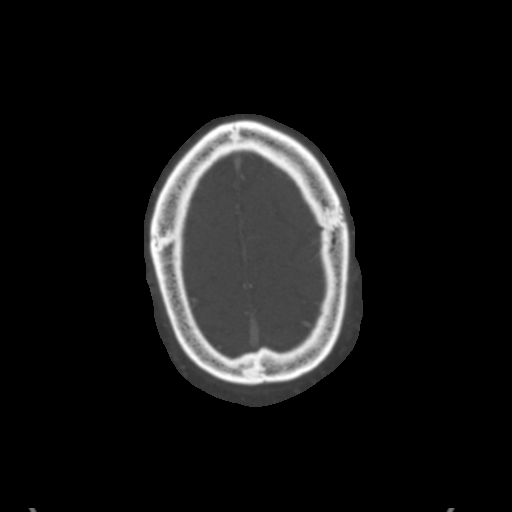

[14 of 47 positions shown; findings below may reference images not displayed]

FINDINGS: CT HEAD FINDINGS

Brain: Large volume subarachnoid hemorrhage is present within the
basal cisterns. This extends inferiorly along the brainstem and
laterally into the left greater than right sylvian fissures.
Scattered parasagittal sulcal subarachnoid hemorrhage.

Mild prominence of the ventricles may reflect volume loss or mild
hydrocephalus. Gray-white differentiation is preserved. No
extra-axial collection.

Vascular: Better evaluated on CTA portion.

Skull: Unremarkable.

Sinuses: Minor mucosal thickening.

Orbits: Left lens replacement.

Review of the MIP images confirms the above findings

CTA NECK FINDINGS

Aortic arch: Minor plaque along the arch. Great vessel origins are
patent.

Right carotid system: Patent. Mild calcified plaque along the distal
common carotid and proximal internal carotid. No stenosis.

Left carotid system: Patent.  No stenosis.

Vertebral arteries: Patent.  No stenosis.

Skeleton: No acute osseous abnormality.

Other neck: Enteric tube is coiled in the pharynx and proximal
esophagus.

Upper chest: Included upper lungs are clear.

Review of the MIP images confirms the above findings

CTA HEAD FINDINGS

Anterior circulation: Intracranial internal carotid arteries are
patent with mild calcified plaque along the cavernous segments.
Additional mild calcified plaque along the paraclinoid portions. No
high-grade stenosis. Anterior and middle cerebral arteries are
patent. No aneurysm identified.

Posterior circulation: Intracranial vertebral arteries are patent.
Basilar artery is patent. Major cerebellar artery origins are
patent. Posterior cerebral arteries are patent.

Venous sinuses: As permitted by contrast timing, patent.

Review of the MIP images confirms the above findings
IMPRESSION: Large volume subarachnoid hemorrhage centered within the basal
cisterns. Mild prominence of the ventricles may reflect volume loss
or mild hydrocephalus. No evidence of acute infarction.

No aneurysm or AVM.

These results were communicated to Dr. OBASI at [DATE] on
[DATE] by text page via the [employer] messaging system.

## 2021-03-05 IMAGING — CT CT HEAD CODE STROKE
3 of 4 series · 14 of 47 positions shown, 16 images · IV contrast (omnipaque)
Comparison: None.

CLINICAL DATA: Neuro deficit, acute, stroke suspected

EXAM:
CT ANGIOGRAPHY HEAD AND NECK
TECHNIQUE: Multidetector CT imaging of the head and neck was performed using
the standard protocol during bolus administration of intravenous
contrast. Multiplanar CT image reconstructions and MIPs were
obtained to evaluate the vascular anatomy. Carotid stenosis
measurements (when applicable) are obtained utilizing NASCET
criteria, using the distal internal carotid diameter as the
denominator.
CONTRAST:  75 mL Omnipaque 350

[Series 4: head 2.0 h70h · axial · 0.51mm/px · z∈[-45,+91]mm · 8 of 84 slices shown, 10 images]
[im 8/84  brain]
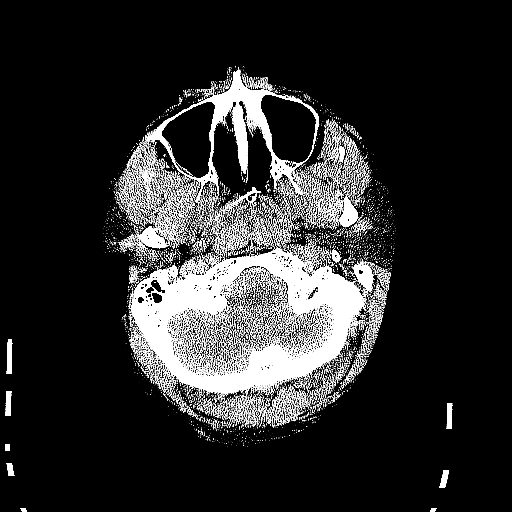
[im 8/84  bone]
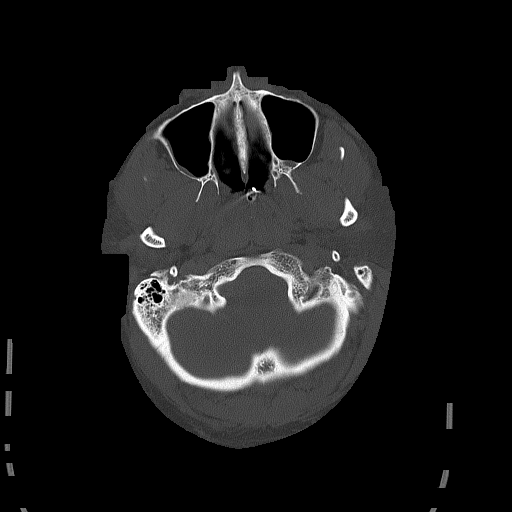
[im 16/84  brain]
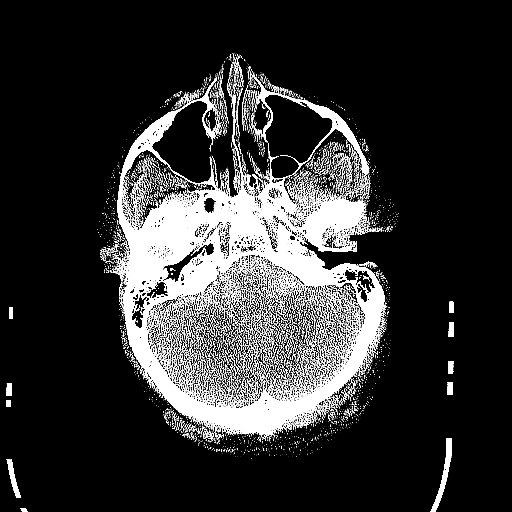
[im 28/84  brain]
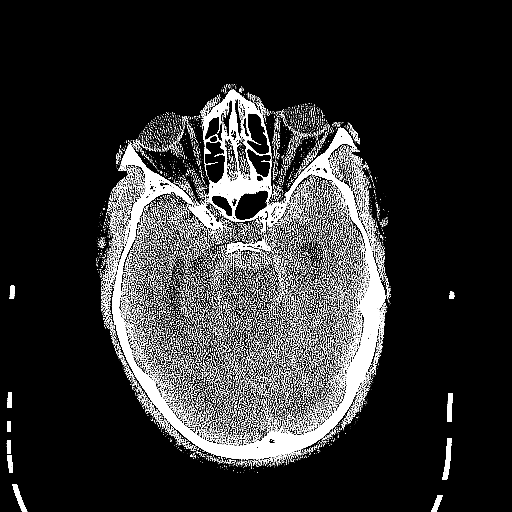
[im 36/84  brain]
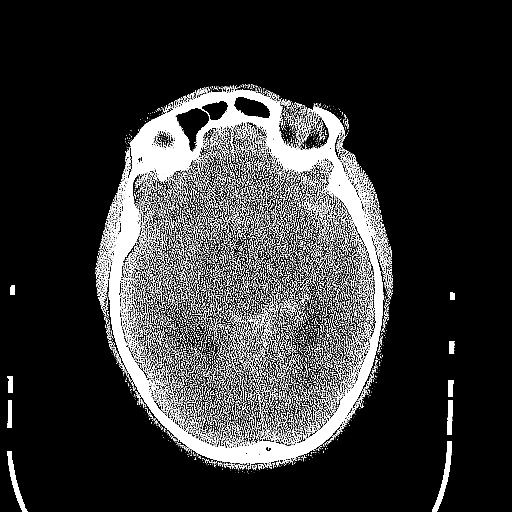
[im 48/84  brain]
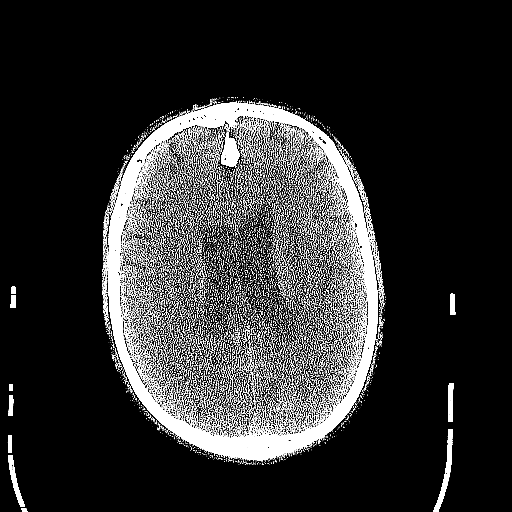
[im 48/84  bone]
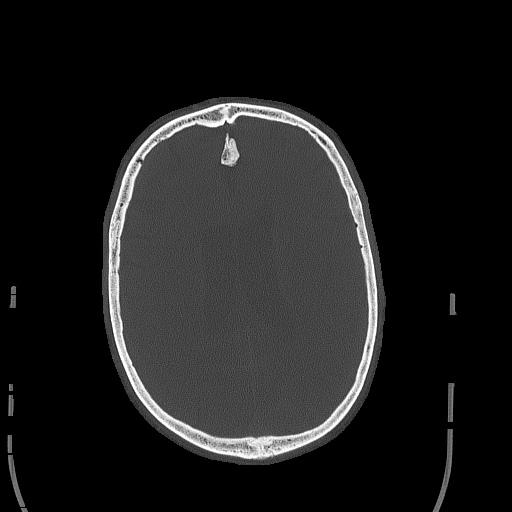
[im 56/84  brain]
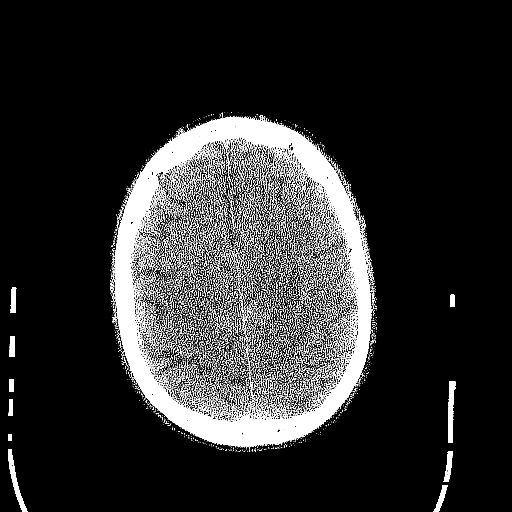
[im 68/84  brain]
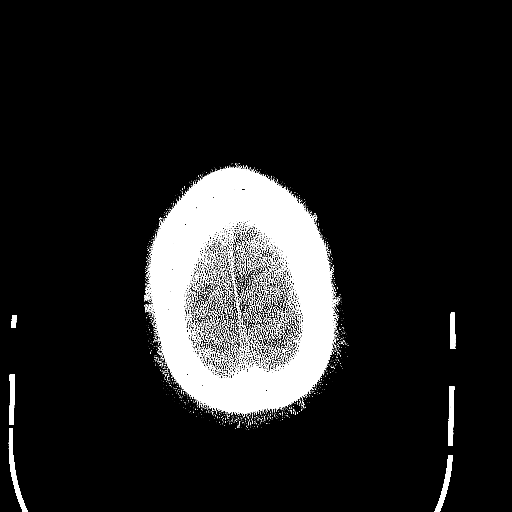
[im 76/84  brain]
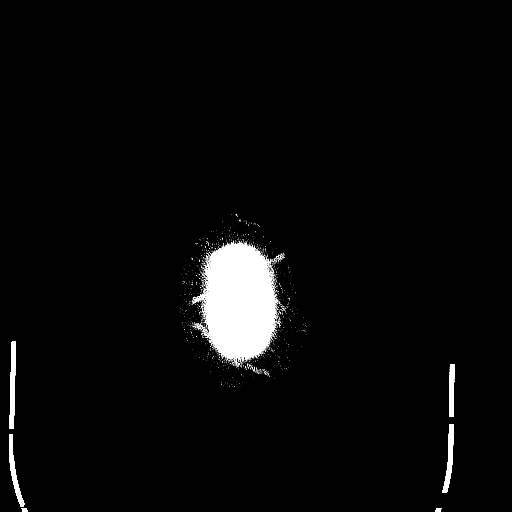

[Series 5: head 3.0 mpr cor · coronal · 0.37mm/px · 3 of 80 slices shown]
[im 27/80  brain]
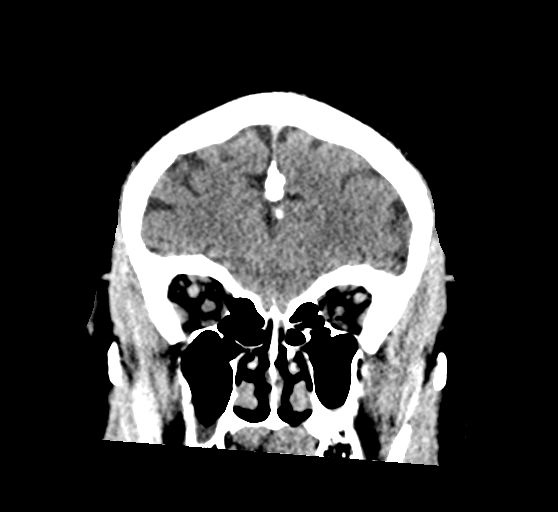
[im 36/80  brain]
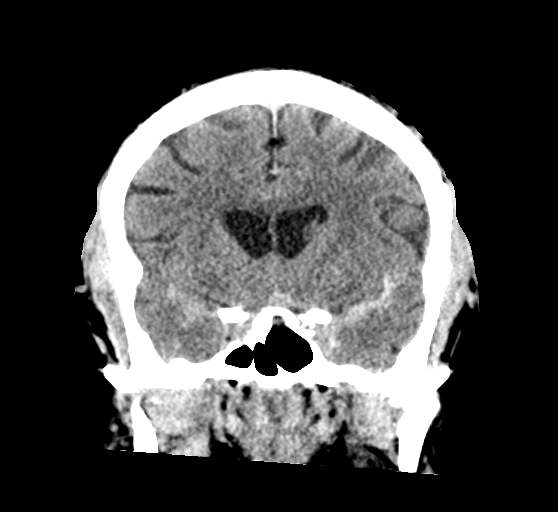
[im 44/80  brain]
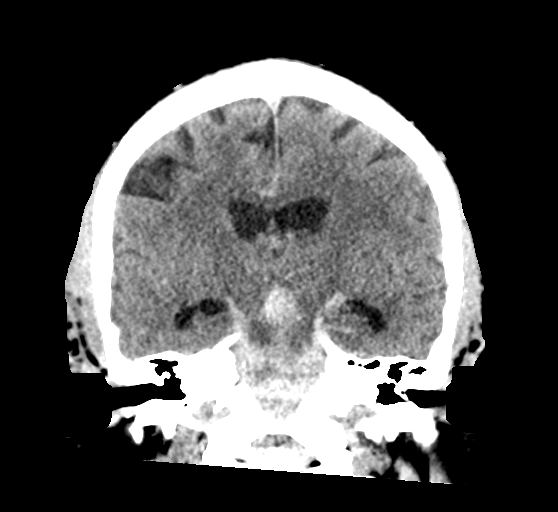

[Series 6: head 3.0 mpr sag · sagittal · 0.33mm/px · 3 of 67 slices shown]
[im 23/67  brain]
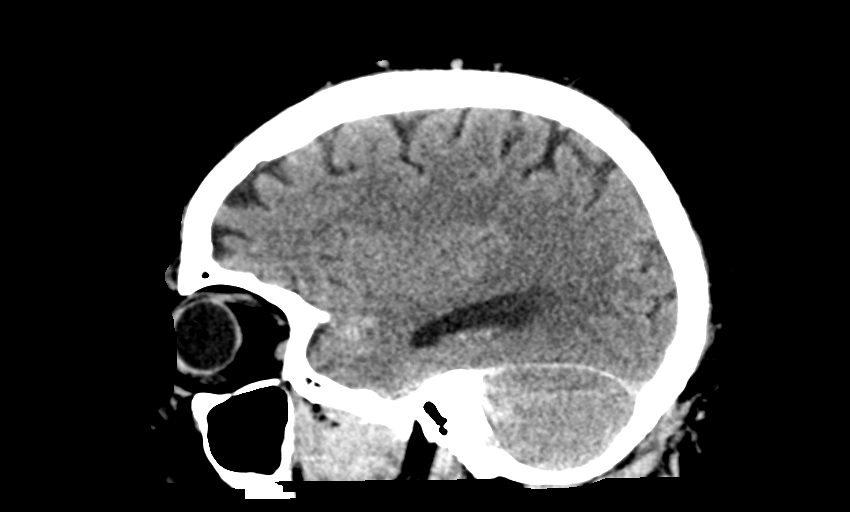
[im 34/67  brain]
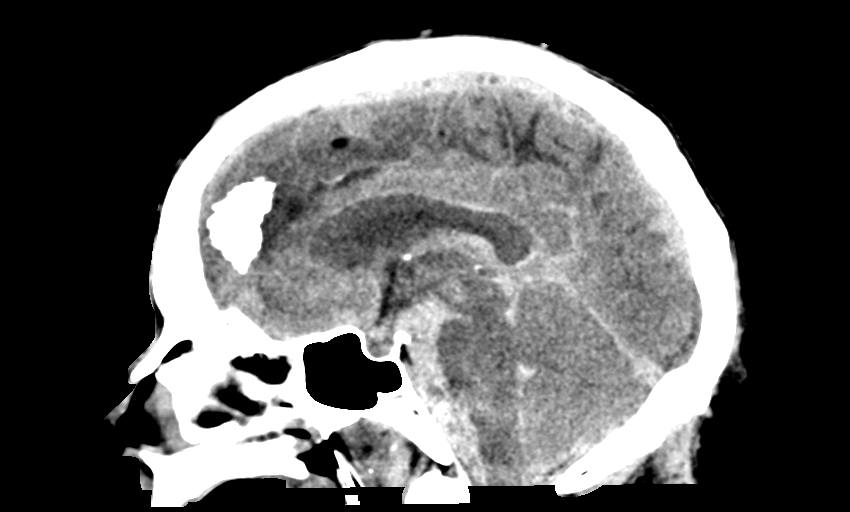
[im 45/67  brain]
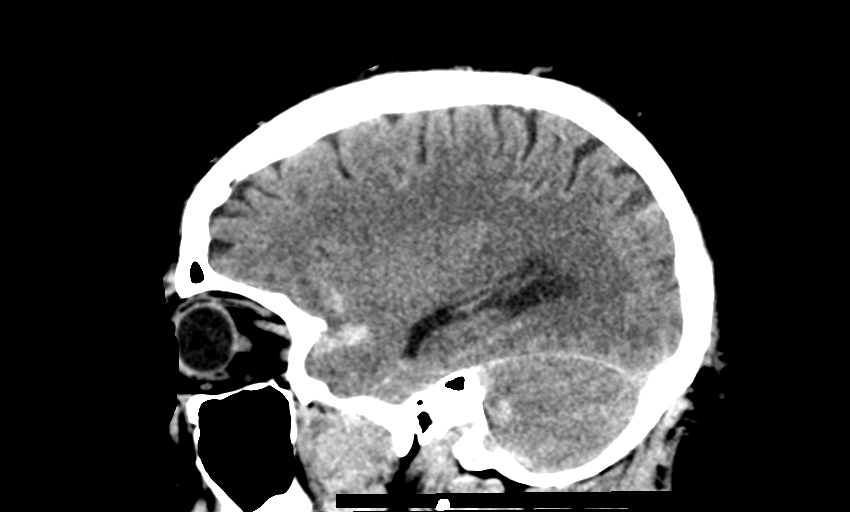

[14 of 47 positions shown; findings below may reference images not displayed]

FINDINGS: CT HEAD FINDINGS

Brain: Large volume subarachnoid hemorrhage is present within the
basal cisterns. This extends inferiorly along the brainstem and
laterally into the left greater than right sylvian fissures.
Scattered parasagittal sulcal subarachnoid hemorrhage.

Mild prominence of the ventricles may reflect volume loss or mild
hydrocephalus. Gray-white differentiation is preserved. No
extra-axial collection.

Vascular: Better evaluated on CTA portion.

Skull: Unremarkable.

Sinuses: Minor mucosal thickening.

Orbits: Left lens replacement.

Review of the MIP images confirms the above findings

CTA NECK FINDINGS

Aortic arch: Minor plaque along the arch. Great vessel origins are
patent.

Right carotid system: Patent. Mild calcified plaque along the distal
common carotid and proximal internal carotid. No stenosis.

Left carotid system: Patent.  No stenosis.

Vertebral arteries: Patent.  No stenosis.

Skeleton: No acute osseous abnormality.

Other neck: Enteric tube is coiled in the pharynx and proximal
esophagus.

Upper chest: Included upper lungs are clear.

Review of the MIP images confirms the above findings

CTA HEAD FINDINGS

Anterior circulation: Intracranial internal carotid arteries are
patent with mild calcified plaque along the cavernous segments.
Additional mild calcified plaque along the paraclinoid portions. No
high-grade stenosis. Anterior and middle cerebral arteries are
patent. No aneurysm identified.

Posterior circulation: Intracranial vertebral arteries are patent.
Basilar artery is patent. Major cerebellar artery origins are
patent. Posterior cerebral arteries are patent.

Venous sinuses: As permitted by contrast timing, patent.

Review of the MIP images confirms the above findings
IMPRESSION: Large volume subarachnoid hemorrhage centered within the basal
cisterns. Mild prominence of the ventricles may reflect volume loss
or mild hydrocephalus. No evidence of acute infarction.

No aneurysm or AVM.

These results were communicated to Dr. OBASI at [DATE] on
[DATE] by text page via the [employer] messaging system.

## 2021-03-05 MED ORDER — SODIUM CHLORIDE 0.9 % IV SOLN: 20.0000 mg/kg | Freq: Once | INTRAVENOUS | Status: DC

## 2021-03-05 MED ORDER — SODIUM CHLORIDE 0.9% FLUSH: 3.0000 mL | Freq: Once | INTRAVENOUS | Status: DC

## 2021-03-05 MED ORDER — CHLORHEXIDINE GLUCONATE 0.12% ORAL RINSE (MEDLINE KIT)
15.0000 mL | Freq: Two times a day (BID) | OROMUCOSAL | Status: DC
  Administered 2021-03-05 – 2021-03-07 (×6): 15 mL via OROMUCOSAL

## 2021-03-05 MED ORDER — ACETAMINOPHEN 650 MG RE SUPP: 650.0000 mg | RECTAL | Status: DC | PRN

## 2021-03-05 MED ORDER — CLEVIDIPINE BUTYRATE 0.5 MG/ML IV EMUL
0.0000 mg/h | INTRAVENOUS | Status: DC
  Administered 2021-03-05: 18 mg/h via INTRAVENOUS
  Administered 2021-03-05: 21 mg/h via INTRAVENOUS
  Administered 2021-03-05: 14 mg/h via INTRAVENOUS
  Administered 2021-03-05: 1 mg/h via INTRAVENOUS
  Administered 2021-03-05 (×2): 21 mg/h via INTRAVENOUS
  Administered 2021-03-05: 14 mg/h via INTRAVENOUS
  Administered 2021-03-05 – 2021-03-06 (×4): 21 mg/h via INTRAVENOUS
  Administered 2021-03-06: 19 mg/h via INTRAVENOUS
  Administered 2021-03-06: 21 mg/h via INTRAVENOUS
  Administered 2021-03-06: 19 mg/h via INTRAVENOUS
  Administered 2021-03-06: 21 mg/h via INTRAVENOUS
  Administered 2021-03-06: 19 mg/h via INTRAVENOUS
  Administered 2021-03-07 (×3): 21 mg/h via INTRAVENOUS
  Administered 2021-03-07: 15 mg/h via INTRAVENOUS
  Administered 2021-03-07 (×2): 21 mg/h via INTRAVENOUS
  Administered 2021-03-07: 19 mg/h via INTRAVENOUS
  Administered 2021-03-07 – 2021-03-08 (×10): 21 mg/h via INTRAVENOUS
  Administered 2021-03-08: 19 mg/h via INTRAVENOUS
  Administered 2021-03-08 – 2021-03-09 (×4): 21 mg/h via INTRAVENOUS
  Administered 2021-03-09: 19 mg/h via INTRAVENOUS
  Administered 2021-03-09: 21 mg/h via INTRAVENOUS
  Administered 2021-03-09: 17 mg/h via INTRAVENOUS
  Administered 2021-03-09: 19 mg/h via INTRAVENOUS
  Administered 2021-03-09: 21 mg/h via INTRAVENOUS
  Administered 2021-03-09: 17 mg/h via INTRAVENOUS
  Administered 2021-03-10: 18 mg/h via INTRAVENOUS
  Administered 2021-03-10 (×2): 21 mg/h via INTRAVENOUS
  Administered 2021-03-10: 18 mg/h via INTRAVENOUS
  Administered 2021-03-10 (×2): 21 mg/h via INTRAVENOUS
  Administered 2021-03-10: 4 mg/h via INTRAVENOUS
  Filled 2021-03-05 (×2): qty 100
  Filled 2021-03-05: qty 200
  Filled 2021-03-05: qty 100
  Filled 2021-03-05: qty 50
  Filled 2021-03-05 (×2): qty 200
  Filled 2021-03-05: qty 50
  Filled 2021-03-05: qty 100
  Filled 2021-03-05: qty 200
  Filled 2021-03-05 (×11): qty 100
  Filled 2021-03-05: qty 50
  Filled 2021-03-05 (×4): qty 100
  Filled 2021-03-05: qty 200
  Filled 2021-03-05 (×2): qty 100
  Filled 2021-03-05: qty 50
  Filled 2021-03-05 (×12): qty 100
  Filled 2021-03-05: qty 50
  Filled 2021-03-05 (×3): qty 100

## 2021-03-05 MED ORDER — SENNOSIDES-DOCUSATE SODIUM 8.6-50 MG PO TABS: 1.0000 | ORAL_TABLET | Freq: Two times a day (BID) | ORAL | Status: DC

## 2021-03-05 MED ORDER — PANTOPRAZOLE SODIUM 40 MG IV SOLR
40.0000 mg | Freq: Every day | INTRAVENOUS | Status: DC
  Administered 2021-03-05 – 2021-03-08 (×4): 40 mg via INTRAVENOUS
  Filled 2021-03-05 (×4): qty 40

## 2021-03-05 MED ORDER — ACETAMINOPHEN 160 MG/5ML PO SOLN
650.0000 mg | ORAL | Status: DC | PRN
  Administered 2021-03-06 – 2021-03-13 (×24): 650 mg
  Filled 2021-03-05 (×23): qty 20.3

## 2021-03-05 MED ORDER — CHLORHEXIDINE GLUCONATE CLOTH 2 % EX PADS
6.0000 | MEDICATED_PAD | Freq: Every day | CUTANEOUS | Status: DC
  Administered 2021-03-06 – 2021-03-13 (×10): 6 via TOPICAL

## 2021-03-05 MED ORDER — PROPOFOL 1000 MG/100ML IV EMUL
INTRAVENOUS | Status: AC
  Filled 2021-03-05: qty 100

## 2021-03-05 MED ORDER — SODIUM CHLORIDE 3 % IV SOLN
INTRAVENOUS | Status: DC
  Filled 2021-03-05: qty 500
  Filled 2021-03-05: qty 1000
  Filled 2021-03-05 (×2): qty 500

## 2021-03-05 MED ORDER — INSULIN ASPART 100 UNIT/ML IJ SOLN
0.0000 [IU] | INTRAMUSCULAR | Status: DC
  Administered 2021-03-05: 5 [IU] via SUBCUTANEOUS
  Administered 2021-03-05: 3 [IU] via SUBCUTANEOUS
  Administered 2021-03-05: 8 [IU] via SUBCUTANEOUS
  Administered 2021-03-06 (×6): 3 [IU] via SUBCUTANEOUS
  Administered 2021-03-07: 2 [IU] via SUBCUTANEOUS
  Administered 2021-03-07: 3 [IU] via SUBCUTANEOUS
  Administered 2021-03-07: 5 [IU] via SUBCUTANEOUS
  Administered 2021-03-07 (×2): 3 [IU] via SUBCUTANEOUS
  Administered 2021-03-07: 8 [IU] via SUBCUTANEOUS
  Administered 2021-03-07: 11 [IU] via SUBCUTANEOUS
  Administered 2021-03-08: 8 [IU] via SUBCUTANEOUS
  Administered 2021-03-08: 3 [IU] via SUBCUTANEOUS
  Administered 2021-03-08: 11 [IU] via SUBCUTANEOUS
  Administered 2021-03-08: 3 [IU] via SUBCUTANEOUS
  Administered 2021-03-08: 8 [IU] via SUBCUTANEOUS
  Administered 2021-03-08: 3 [IU] via SUBCUTANEOUS
  Administered 2021-03-09 (×4): 11 [IU] via SUBCUTANEOUS
  Administered 2021-03-09: 15 [IU] via SUBCUTANEOUS
  Administered 2021-03-09: 11 [IU] via SUBCUTANEOUS
  Administered 2021-03-10: 15 [IU] via SUBCUTANEOUS

## 2021-03-05 MED ORDER — IOHEXOL 350 MG/ML SOLN
75.0000 mL | Freq: Once | INTRAVENOUS | Status: AC | PRN
  Administered 2021-03-05: 75 mL via INTRAVENOUS

## 2021-03-05 MED ORDER — TRAMADOL HCL 50 MG PO TABS: 50.0000 mg | ORAL_TABLET | Freq: Four times a day (QID) | ORAL | Status: DC | PRN

## 2021-03-05 MED ORDER — LABETALOL HCL 5 MG/ML IV SOLN
10.0000 mg | INTRAVENOUS | Status: DC | PRN
  Administered 2021-03-05 – 2021-03-10 (×20): 10 mg via INTRAVENOUS
  Filled 2021-03-05 (×18): qty 4

## 2021-03-05 MED ORDER — CLEVIDIPINE BUTYRATE 0.5 MG/ML IV EMUL
INTRAVENOUS | Status: AC
  Filled 2021-03-05: qty 50

## 2021-03-05 MED ORDER — NIMODIPINE 30 MG PO CAPS: 30.0000 mg | ORAL_CAPSULE | ORAL | Status: DC

## 2021-03-05 MED ORDER — POTASSIUM CHLORIDE 10 MEQ/100ML IV SOLN
10.0000 meq | INTRAVENOUS | Status: AC
Start: 2021-03-05 — End: 2021-03-05
  Administered 2021-03-05 (×4): 10 meq via INTRAVENOUS
  Filled 2021-03-05 (×4): qty 100

## 2021-03-05 MED ORDER — SODIUM CHLORIDE 3 % IV BOLUS
250.0000 mL | Freq: Once | INTRAVENOUS | Status: AC
  Administered 2021-03-05: 250 mL via INTRAVENOUS

## 2021-03-05 MED ORDER — PROPOFOL BOLUS VIA INFUSION
40.0000 mg | Freq: Once | INTRAVENOUS | Status: AC
  Administered 2021-03-05: 40 mg via INTRAVENOUS
  Filled 2021-03-05: qty 40

## 2021-03-05 MED ORDER — POLYETHYLENE GLYCOL 3350 17 G PO PACK
17.0000 g | PACK | Freq: Every day | ORAL | Status: DC
  Administered 2021-03-06 – 2021-03-07 (×2): 17 g
  Filled 2021-03-05 (×2): qty 1

## 2021-03-05 MED ORDER — SODIUM CHLORIDE 0.9 % IV SOLN: INTRAVENOUS | Status: DC

## 2021-03-05 MED ORDER — PROPOFOL 1000 MG/100ML IV EMUL
5.0000 ug/kg/min | INTRAVENOUS | Status: DC
Start: 2021-03-05 — End: 2021-03-07
  Administered 2021-03-05: 10 ug/kg/min via INTRAVENOUS
  Administered 2021-03-05: 25 ug/kg/min via INTRAVENOUS
  Filled 2021-03-05 (×2): qty 100

## 2021-03-05 MED ORDER — LEVETIRACETAM IN NACL 1000 MG/100ML IV SOLN
1000.0000 mg | Freq: Two times a day (BID) | INTRAVENOUS | Status: DC
  Administered 2021-03-05 – 2021-03-11 (×13): 1000 mg via INTRAVENOUS
  Filled 2021-03-05 (×13): qty 100

## 2021-03-05 MED ORDER — FENTANYL CITRATE PF 50 MCG/ML IJ SOSY
25.0000 ug | PREFILLED_SYRINGE | Freq: Once | INTRAMUSCULAR | Status: AC
Start: 2021-03-05 — End: 2021-03-05
  Administered 2021-03-05: 25 ug via INTRAVENOUS

## 2021-03-05 MED ORDER — ORAL CARE MOUTH RINSE
15.0000 mL | OROMUCOSAL | Status: DC
  Administered 2021-03-05 – 2021-03-13 (×79): 15 mL via OROMUCOSAL

## 2021-03-05 MED ORDER — NIMODIPINE 6 MG/ML PO SOLN
30.0000 mg | ORAL | Status: DC
  Administered 2021-03-05 – 2021-03-06 (×5): 30 mg
  Filled 2021-03-05 (×6): qty 10

## 2021-03-05 MED ORDER — FENTANYL 2500MCG IN NS 250ML (10MCG/ML) PREMIX INFUSION
25.0000 ug/h | INTRAVENOUS | Status: DC
  Administered 2021-03-05: 50 ug/h via INTRAVENOUS
  Administered 2021-03-06: 25 ug/h via INTRAVENOUS
  Administered 2021-03-06: 50 ug/h via INTRAVENOUS
  Administered 2021-03-07: 150 ug/h via INTRAVENOUS
  Filled 2021-03-05 (×4): qty 250

## 2021-03-05 MED ORDER — DOCUSATE SODIUM 50 MG/5ML PO LIQD
100.0000 mg | Freq: Two times a day (BID) | ORAL | Status: DC
  Administered 2021-03-05 – 2021-03-07 (×4): 100 mg
  Filled 2021-03-05 (×4): qty 10

## 2021-03-05 MED ORDER — ACETAMINOPHEN 325 MG PO TABS
650.0000 mg | ORAL_TABLET | ORAL | Status: DC | PRN
Start: 2021-03-05 — End: 2021-03-14

## 2021-03-05 MED ORDER — LABETALOL HCL 5 MG/ML IV SOLN: 20.0000 mg | Freq: Once | INTRAVENOUS | Status: DC

## 2021-03-05 MED ORDER — FENTANYL BOLUS VIA INFUSION
25.0000 ug | INTRAVENOUS | Status: DC | PRN
  Administered 2021-03-06: 50 ug via INTRAVENOUS
  Administered 2021-03-07: 100 ug via INTRAVENOUS
  Administered 2021-03-07: 50 ug via INTRAVENOUS
  Filled 2021-03-05: qty 100

## 2021-03-05 MED ORDER — STROKE: EARLY STAGES OF RECOVERY BOOK
Freq: Once | Status: AC
  Filled 2021-03-05: qty 1

## 2021-03-05 NOTE — Progress Notes (Signed)
Patient was intubated by ED MD at 0814 this AM.  Positive color change noted.  Bilateral breath sounds auscultated.  Chest xray obtained to confirm tube placement.  Once patient intubated, patient was then transported to CT and back to room 031 without complication.

## 2021-03-05 NOTE — Progress Notes (Signed)
Inpatient Diabetes Program Recommendations  AACE/ADA: New Consensus Statement on Inpatient Glycemic Control (2015)  Target Ranges:  Prepandial:   less than 140 mg/dL      Peak postprandial:   less than 180 mg/dL (1-2 hours)      Critically ill patients:  140 - 180 mg/dL   Lab Results  Component Value Date   GLUCAP 242 (H) 03/12/2021   HGBA1C 6.9 (A) 08/24/2020    Review of Glycemic Control Results for ZAEDYN, COVIN (MRN [id number]) as of 02/18/2021 11:46  Ref. Range 03/11/2021 08:13 02/20/2021 10:29  Glucose-Capillary Latest Ref Range: 70 - 99 mg/dL 867 (H) 544 (H)   Diabetes history: Type 2 DM Outpatient Diabetes medications: Farxiga 10 mg QAM, Novolog 05-27-11, Lantus 35 units QHS, Metformin 2000 mg QD Current orders for Inpatient glycemic control: Novolog 0-15 units Q4H  Inpatient Diabetes Program Recommendations:    Consider adding Semglee 22 units Q24H.   Thanks, Lujean Rave, MSN, RNC-OB Diabetes Coordinator [phone] (8a-5p)

## 2021-03-05 NOTE — ED Provider Notes (Signed)
[facility] EMERGENCY DEPARTMENT Provider Note   CSN: [id number] Arrival date & time: 03/09/2021  4098  An emergency department physician performed an initial assessment on this suspected stroke patient at [place] (taken to ED31 for intubation).  History Chief Complaint  Patient presents with   Code Stroke    Connor [name] is a 75 y.o. male.  HPI Patient was home alone.  He called EMS with arrival of 6:50am.  On arrival he reported severe thunderclap headache approximately 1 hour prior.  On arrival, patient was alert but had a left facial droop.  He complained of severe headache and vomited.  His mental status declined at that time and he became lethargic during transport with decreasing level of responsiveness.  No one else was home with patient for additional history.  EMS reports patient was severely hypertensive on arrival with systolic blood pressure greater than 200, CBG within normal limits.    Past Medical History:  Diagnosis Date   Arthritis    knees   Diabetes mellitus without complication ([place])    Hypertension     Patient Active Problem List   Diagnosis Date Noted   ICH (intracerebral hemorrhage) ([place]) 02/27/2021   Diabetes ([place]) 02/05/2020    No past surgical history on file.     No family history on file.  Social History   Tobacco Use   Smoking status: Former    Packs/day: 0.50    Years: 12.00    Pack years: 6.00    Types: Cigarettes    Quit date: 1995    Years since quitting: 27.7   Smokeless tobacco: Never  Substance Use Topics   Alcohol use: No   Drug use: No    Home Medications Prior to Admission medications   Medication Sig Start Date End Date Taking? Authorizing Provider  aspirin EC 81 MG tablet Take 81 mg by mouth daily.    [provider]  atorvastatin (LIPITOR) 80 MG tablet Take 80 mg by mouth daily.    [provider]  dapagliflozin propanediol (FARXIGA) 10 MG TABS tablet Take 1 tablet (10 mg total) by  mouth daily before breakfast. 03/20/20   Renato Shin, MD  glucose 4 GM chewable tablet Chew 1 tablet by mouth as needed for low blood sugar.    [provider]  hydrALAZINE (APRESOLINE) 100 MG tablet Take 100 mg by mouth 3 (three) times daily.    [provider]  insulin aspart (NOVOLOG FLEXPEN) 100 UNIT/ML FlexPen 3 times a day (just before each meal) 05-27-11 units 08/24/20   Renato Shin, MD  insulin glargine (LANTUS) 100 UNIT/ML injection Inject 0.35 mLs (35 Units total) into the skin at bedtime. 05/19/20   Renato Shin, MD  losartan (COZAAR) 100 MG tablet Take 100 mg by mouth daily.    [provider]  metFORMIN (GLUCOPHAGE-XR) 500 MG 24 hr tablet Take 4 tablets (2,000 mg total) by mouth daily with breakfast. 08/24/20   Renato Shin, MD  METOPROLOL TARTRATE PO Take 50 mg by mouth 2 (two) times daily.    [provider]  Multiple Vitamins-Minerals (MULTIVITAMIN WITH MINERALS) tablet Take 1 tablet by mouth daily.    [provider]  Vitamin D, Ergocalciferol, (DRISDOL) 1.25 MG (50000 UT) CAPS capsule Take 50,000 Units by mouth 2 (two) times a week. 04/23/19   [provider]    Allergies    Patient has no known allergies.  Review of Systems   Review of Systems Level 5 caveat cannot  obtain review of systems due to patient condition. Physical Exam Updated Vital Signs BP (!) 131/52   Pulse 85   Temp (!) 96.8 F (36 C) (Axillary)   Resp 16   Ht '5\' 10"'  (1.778 m)   Wt 132.9 kg   SpO2 99%   BMI 42.04 kg/m   Physical Exam Constitutional:      Comments: Patient is met on EMS stretcher.  He is lethargic and answering some questions with slurred voice.  He can follow some commands.  Slightly diaphoretic.  HENT:     Head: Normocephalic and atraumatic.     Mouth/Throat:     Comments: Airway is clear but there are some thick mucousy secretions in the posterior airway. Eyes:     Comments: Pupils are 2 mm and symmetric in appearance.   Patient is not following commands for extraocular motions.  Cardiovascular:     Rate and Rhythm: Normal rate and regular rhythm.  Pulmonary:     Comments: Patient is breathing spontaneously.  Breathing is slightly labored and some scattered rhonchi. Abdominal:     Comments: Abdomen obese soft and distended.  Musculoskeletal:     Cervical back: Neck supple.     Comments: No obvious extremity deformities.  Skin:    Comments: Skin is warm and slightly diaphoretic.  Neurological:     Comments: Patient is very lethargic.  He can attempted answering questions with slow and slurred voice.  Responses do seem situationally appropriate.  However he is very limited in ability to speak.  He does follow commands to grip with the left and the right hand independently.  He does follow commands to move each foot independently.  With limited ability to perform exam, there is no obvious localizing in the motor exam.    ED Results / Procedures / Treatments   Labs (all labs ordered are listed, but only abnormal results are displayed) Labs Reviewed  CBC - Abnormal; Notable for the following components:      Result Value   Hemoglobin 17.3 (*)    HCT 54.7 (*)    MCV 100.2 (*)    All other components within normal limits  DIFFERENTIAL - Abnormal; Notable for the following components:   Monocytes Absolute 1.1 (*)    All other components within normal limits  COMPREHENSIVE METABOLIC PANEL - Abnormal; Notable for the following components:   Potassium 2.8 (*)    CO2 21 (*)    Glucose, Bld 239 (*)    Creatinine, Ser 1.39 (*)    GFR, Estimated 54 (*)    All other components within normal limits  GLUCOSE, CAPILLARY - Abnormal; Notable for the following components:   Glucose-Capillary 242 (*)    All other components within normal limits  GLUCOSE, CAPILLARY - Abnormal; Notable for the following components:   Glucose-Capillary 251 (*)    All other components within normal limits  I-STAT CHEM 8, ED -  Abnormal; Notable for the following components:   Potassium 2.7 (*)    Glucose, Bld 235 (*)    Hemoglobin 18.4 (*)    HCT 54.0 (*)    All other components within normal limits  CBG MONITORING, ED - Abnormal; Notable for the following components:   Glucose-Capillary 225 (*)    All other components within normal limits  I-STAT ARTERIAL BLOOD GAS, ED - Abnormal; Notable for the following components:   pO2, Arterial 353 (*)    Hemoglobin 17.3 (*)    All other components within normal limits  RESP PANEL BY RT-PCR (FLU A&B, COVID) ARPGX2  MRSA NEXT GEN BY PCR, NASAL  PROTIME-INR  APTT  HEMOGLOBIN A1C  ETHANOL  RAPID URINE DRUG SCREEN, HOSP PERFORMED  SODIUM  SODIUM  PHOSPHORUS  MAGNESIUM  C-REACTIVE PROTEIN  BLOOD GAS, ARTERIAL  BASIC METABOLIC PANEL    EKG EKG Interpretation  Date/Time:  Monday March 05 2021 08:59:09 EDT Ventricular Rate:  75 PR Interval:  215 QRS Duration: 89 QT Interval:  398 QTC Calculation: 445 R Axis:   -17 Text Interpretation: Sinus rhythm Borderline prolonged PR interval Borderline left axis deviation Abnormal R-wave progression, early transition Minimal ST elevation, anterior leads agree, no old comparison Confirmed by Charlesetta Shanks [phone]) on 02/21/2021 12:05:33 PM  Radiology DG Chest Portable 1 View  Result Date: 02/21/2021 CLINICAL DATA:  Intubated EXAM: PORTABLE CHEST 1 VIEW COMPARISON:  None FINDINGS: Endotracheal tube is 3 cm above the carina. Possible enteric tube coiled in the pharynx. Low lung volumes with crowding of markings. No consolidation or edema. No pleural effusion. Normal heart size. IMPRESSION: Endotracheal tube in place. Possible enteric tube coiled in the pharynx. No acute abnormality. Electronically Signed   By: Macy Mis M.D.   On: 02/22/2021 08:51   CT HEAD CODE STROKE WO CONTRAST  Result Date: 03/07/2021 CLINICAL DATA:  Neuro deficit, acute, stroke suspected EXAM: CT ANGIOGRAPHY HEAD AND NECK TECHNIQUE:  Multidetector CT imaging of the head and neck was performed using the standard protocol during bolus administration of intravenous contrast. Multiplanar CT image reconstructions and MIPs were obtained to evaluate the vascular anatomy. Carotid stenosis measurements (when applicable) are obtained utilizing NASCET criteria, using the distal internal carotid diameter as the denominator. CONTRAST:  75 mL Omnipaque 350 COMPARISON:  None. FINDINGS: CT HEAD FINDINGS Brain: Large volume subarachnoid hemorrhage is present within the basal cisterns. This extends inferiorly along the brainstem and laterally into the left greater than right sylvian fissures. Scattered parasagittal sulcal subarachnoid hemorrhage. Mild prominence of the ventricles may reflect volume loss or mild hydrocephalus. Gray-white differentiation is preserved. No extra-axial collection. Vascular: Better evaluated on CTA portion. Skull: Unremarkable. Sinuses: Minor mucosal thickening. Orbits: Left lens replacement. Review of the MIP images confirms the above findings CTA NECK FINDINGS Aortic arch: Minor plaque along the arch. Great vessel origins are patent. Right carotid system: Patent. Mild calcified plaque along the distal common carotid and proximal internal carotid. No stenosis. Left carotid system: Patent.  No stenosis. Vertebral arteries: Patent.  No stenosis. Skeleton: No acute osseous abnormality. Other neck: Enteric tube is coiled in the pharynx and proximal esophagus. Upper chest: Included upper lungs are clear. Review of the MIP images confirms the above findings CTA HEAD FINDINGS Anterior circulation: Intracranial internal carotid arteries are patent with mild calcified plaque along the cavernous segments. Additional mild calcified plaque along the paraclinoid portions. No high-grade stenosis. Anterior and middle cerebral arteries are patent. No aneurysm identified. Posterior circulation: Intracranial vertebral arteries are patent. Basilar  artery is patent. Major cerebellar artery origins are patent. Posterior cerebral arteries are patent. Venous sinuses: As permitted by contrast timing, patent. Review of the MIP images confirms the above findings IMPRESSION: Large volume subarachnoid hemorrhage centered within the basal cisterns. Mild prominence of the ventricles may reflect volume loss or mild hydrocephalus. No evidence of acute infarction. No aneurysm or AVM. These results were communicated to Dr. Theda Sers at 8:56 am on 02/15/2021 by text page via the [facility] messaging system. Electronically Signed   By: Macy Mis M.D.   On: 02/15/2021  09:06   CT ANGIO HEAD NECK W WO CM (CODE STROKE)  Result Date: 03/11/2021 CLINICAL DATA:  Neuro deficit, acute, stroke suspected EXAM: CT ANGIOGRAPHY HEAD AND NECK TECHNIQUE: Multidetector CT imaging of the head and neck was performed using the standard protocol during bolus administration of intravenous contrast. Multiplanar CT image reconstructions and MIPs were obtained to evaluate the vascular anatomy. Carotid stenosis measurements (when applicable) are obtained utilizing NASCET criteria, using the distal internal carotid diameter as the denominator. CONTRAST:  75 mL Omnipaque 350 COMPARISON:  None. FINDINGS: CT HEAD FINDINGS Brain: Large volume subarachnoid hemorrhage is present within the basal cisterns. This extends inferiorly along the brainstem and laterally into the left greater than right sylvian fissures. Scattered parasagittal sulcal subarachnoid hemorrhage. Mild prominence of the ventricles may reflect volume loss or mild hydrocephalus. Gray-white differentiation is preserved. No extra-axial collection. Vascular: Better evaluated on CTA portion. Skull: Unremarkable. Sinuses: Minor mucosal thickening. Orbits: Left lens replacement. Review of the MIP images confirms the above findings CTA NECK FINDINGS Aortic arch: Minor plaque along the arch. Great vessel origins are patent. Right carotid system:  Patent. Mild calcified plaque along the distal common carotid and proximal internal carotid. No stenosis. Left carotid system: Patent.  No stenosis. Vertebral arteries: Patent.  No stenosis. Skeleton: No acute osseous abnormality. Other neck: Enteric tube is coiled in the pharynx and proximal esophagus. Upper chest: Included upper lungs are clear. Review of the MIP images confirms the above findings CTA HEAD FINDINGS Anterior circulation: Intracranial internal carotid arteries are patent with mild calcified plaque along the cavernous segments. Additional mild calcified plaque along the paraclinoid portions. No high-grade stenosis. Anterior and middle cerebral arteries are patent. No aneurysm identified. Posterior circulation: Intracranial vertebral arteries are patent. Basilar artery is patent. Major cerebellar artery origins are patent. Posterior cerebral arteries are patent. Venous sinuses: As permitted by contrast timing, patent. Review of the MIP images confirms the above findings IMPRESSION: Large volume subarachnoid hemorrhage centered within the basal cisterns. Mild prominence of the ventricles may reflect volume loss or mild hydrocephalus. No evidence of acute infarction. No aneurysm or AVM. These results were communicated to Dr. Theda Sers at 8:56 am on 03/03/2021 by text page via the [facility] messaging system. Electronically Signed   By: Macy Mis M.D.   On: 03/04/2021 09:06    Procedures Procedure Name: Intubation Date/Time: 02/15/2021 8:34 AM Performed by: Charlesetta Shanks, MD Pre-anesthesia Checklist: Patient identified, Patient being monitored, Emergency Drugs available, Timeout performed and Suction available Oxygen Delivery Method: Ambu bag Preoxygenation: Pre-oxygenation with 100% oxygen Induction Type: Rapid sequence Ventilation: Mask ventilation without difficulty Laryngoscope Size: Glidescope and 4 Tube size: 7.5 mm Number of attempts: 1 Placement Confirmation: ETT inserted through  vocal cords under direct vision, CO2 detector and Breath sounds checked- equal and bilateral Dental Injury: Teeth and Oropharynx as per pre-operative assessment     CRITICAL CARE Performed by: Charlesetta Shanks   Total critical care time: 30 minutes  Critical care time was exclusive of separately billable procedures and treating other patients.  Critical care was necessary to treat or prevent imminent or life-threatening deterioration.  Critical care was time spent personally by me on the following activities: development of treatment plan with patient and/or surrogate as well as nursing, discussions with consultants, evaluation of patient's response to treatment, examination of patient, obtaining history from patient or surrogate, ordering and performing treatments and interventions, ordering and review of laboratory studies, ordering and review of radiographic studies, pulse oximetry and re-evaluation of patient's  condition.   Medications Ordered in ED Medications  sodium chloride flush (NS) 0.9 % injection 3 mL (3 mLs Intravenous Not Given 03/16/2021 1130)  propofol (DIPRIVAN) 1000 MG/100ML infusion (10 mcg/kg/min  132 kg Intravenous Infusion Verify 03/02/2021 1100)  sodium chloride (hypertonic) 3 % solution ( Intravenous Infusion Verify 03/04/2021 1100)  acetaminophen (TYLENOL) tablet 650 mg (has no administration in time range)    Or  acetaminophen (TYLENOL) 160 MG/5ML solution 650 mg (has no administration in time range)    Or  acetaminophen (TYLENOL) suppository 650 mg (has no administration in time range)  pantoprazole (PROTONIX) injection 40 mg (has no administration in time range)  clevidipine (CLEVIPREX) infusion 0.5 mg/mL (14 mg/hr Intravenous Infusion Verify 03/07/2021 1100)  docusate (COLACE) 50 MG/5ML liquid 100 mg (has no administration in time range)  polyethylene glycol (MIRALAX / GLYCOLAX) packet 17 g (has no administration in time range)  fentaNYL 255mg in NS 2557m(1078mml)  infusion-PREMIX (75 mcg/hr Intravenous Infusion Verify 02/23/2021 1100)  fentaNYL (SUBLIMAZE) bolus via infusion 25-100 mcg (has no administration in time range)  levETIRAcetam (KEPPRA) IVPB 1000 mg/100 mL premix (0 mg Intravenous Stopped 03/06/2021 0932)  potassium chloride 10 mEq in 100 mL IVPB (10 mEq Intravenous New Bag/Given 03/03/2021 1142)  insulin aspart (novoLOG) injection 0-15 Units (8 Units Subcutaneous Given 02/21/2021 1155)  niMODipine (NIMOTOP) capsule 30 mg (has no administration in time range)  clevidipine (CLEVIPREX) 0.5 MG/ML infusion (  Duplicate 02/27/41/8756811propofol (DIPRIVAN) 1000 MG/XB/262MBfusion (  Duplicate 03/01/58/7451638propofol (DIPRIVAN) bolus via infusion 40 mg (40 mg Intravenous Bolus from Bag 02/15/2021 0825)  sodium chloride 3% (hypertonic) IV bolus 250 mL ( Intravenous Stopped 02/27/2021 0932)   stroke: mapping our early stages of recovery book ( Does not apply Given 03/10/2021 1154)  fentaNYL (SUBLIMAZE) injection 25 mcg (25 mcg Intravenous Given 02/28/2021 0900)  iohexol (OMNIPAQUE) 350 MG/ML injection 75 mL (75 mLs Intravenous Contrast Given 03/08/2021 0859)    ED Course  I have reviewed the triage vital signs and the nursing notes.  Pertinent labs & imaging results that were available during my care of the patient were reviewed by me and considered in my medical decision making (see chart for details).    MDM Rules/Calculators/A&P                           Patient arrived as a code stroke.  On arrival patient was very lethargic.  He was progressively becoming obtunded during EMS transport with report of a thunderclap headache and severe hypertension.  At this time high suspicion is for intracerebral hemorrhage.  Patient was intubated on arrival for airway protection with prior vomiting and rapidly decreasing mental status.  Patient then taken directly to CT scan results pending at this time.  Neurology at bedside.  CT positive for subarachnoid hemorrhage.  Neurology will  admit for ongoing management. Final Clinical Impression(s) / ED Diagnoses Final diagnoses:  SAH (subarachnoid hemorrhage) ([facility]ensHypertensive emergency    Rx / DC Orders ED Discharge Orders     None        PfeCharlesetta ShanksD 02/27/2021 1208

## 2021-03-05 NOTE — Progress Notes (Signed)
Ok for pt to have MRI per sons(Michael and Homero Fellers)

## 2021-03-05 NOTE — Progress Notes (Signed)
Medication, clothes, and cell phone sent home with Connor [name]. Patient's brother [name] taking dentures to patient's house.

## 2021-03-05 NOTE — Progress Notes (Signed)
Patient was transported to CT via ventilator RT x 1, RN x 1, Transporter x 1 with no complications. Pt is now @ 4N22C. RT will continue to monitor.

## 2021-03-05 NOTE — Progress Notes (Signed)
Patient transported from ED to room 4N22. Decreased FIO2 to 40% based off of post intubation results.  Sample was obtained on ventilator settings of VT: 560, RR: 18, FIO2: 100%, and PEEP: 5.0.  No complications noted.    Ref. Range Apr 01, 2021 10:02  Sample type Unknown ARTERIAL  pH, Arterial Latest Ref Range: 7.350 - 7.450  7.373  pCO2 arterial Latest Ref Range: 32.0 - 48.0 mmHg 47.8  pO2, Arterial Latest Ref Range: 83.0 - 108.0 mmHg 353 (H)  TCO2 Latest Ref Range: 22 - 32 mmol/L 29  Acid-Base Excess Latest Ref Range: 0.0 - 2.0 mmol/L 2.0  Bicarbonate Latest Ref Range: 20.0 - 28.0 mmol/L 27.8  O2 Saturation Latest Units: % 100.0  Patient temperature Unknown 98.2 F  Collection site Unknown Radial

## 2021-03-05 NOTE — H&P (Signed)
Neurology H&P  Connor [name] MR# [id number] 03-20-2021  CC: acute onset severe HA at 0650, n/v, altered mental status.   History is obtained from: EMS.   HPI: Connor [name] is a 75 y.o. male with a PMHx of morbid obesity, IDDM II, HTN, HLD, pulmonary nodules, and OA who presents as a code stroke. Per EMS, patient woke up his normal self, but at 0650 had acute onset of severe HA. He was alone. When EMS arrived HA remained and patient's mental status was normal for him. Afterwards, patient began to have n/v and EMS noted a left facial droop. BP per fire was 270s and BP per EMS 190s. CBG 227.   After brief exam on the ED bridge, patient was taken emergently for intubation in ED room. BP 170s. Propofol bolus'd with maintenance infusion started. CT head was + for [facility]. Cleviprex started with goal BP 120-140. Started patient on seizure prophylaxis at 1 gm IV q12 hours.   In review of chart, patient had UC visit in May 2022 for hypoglycemia. Last endocrinology visit note in 3/22 states patient's DM medicine doses were lowered.  Patient's BP was high on that day, but can not find exact reading.   LKW: 0650 hours.  tNK given: No, SAH.  IR Thrombectomy No, not a candidate.  MRS 1  NIHSS:  LOC Responsiveness 1 LOC Questions 0 LOC Commands 0 Horizontal eye movement 0 Visual field 0 Facial palsy 0 Motor arm - Right arm 3 Motor arm - Left arm 0 Motor leg - Right leg 3 Motor leg - Left leg 0 Limb ataxia 0 Sensory test 0 Language 0 Speech 0 Extinction and inattention 0  Result  NIH Stroke Scale/Score (NIHSS) 7  ROS: A complete ROS was unable to be performed due to emergent nature of event.   Past Medical History:  Diagnosis Date   Arthritis    knees   Diabetes mellitus without complication (HCC)    Hypertension   Morbid obesity. Pulmonary nodule surveillance. HLD.   No family history on file.  Social History:  reports that he quit smoking about 27 years ago. His smoking use  included cigarettes. He has a 6.00 pack-year smoking history. He has never used smokeless tobacco. He reports that he does not drink alcohol and does not use drugs.   Prior to Admission medications   Medication Sig Start Date End Date Taking? Authorizing Provider  aspirin EC 81 MG tablet Take 81 mg by mouth daily.    [provider]  atorvastatin (LIPITOR) 80 MG tablet Take 80 mg by mouth daily.    [provider]  dapagliflozin propanediol (FARXIGA) 10 MG TABS tablet Take 1 tablet (10 mg total) by mouth daily before breakfast. 03/20/20   Romero Belling, MD  glucose 4 GM chewable tablet Chew 1 tablet by mouth as needed for low blood sugar.    [provider]  hydrALAZINE (APRESOLINE) 100 MG tablet Take 100 mg by mouth 3 (three) times daily.    [provider]  insulin aspart (NOVOLOG FLEXPEN) 100 UNIT/ML FlexPen 3 times a day (just before each meal) 05-27-11 units 08/24/20   Romero Belling, MD  insulin glargine (LANTUS) 100 UNIT/ML injection Inject 0.35 mLs (35 Units total) into the skin at bedtime. 05/19/20   Romero Belling, MD  losartan (COZAAR) 100 MG tablet Take 100 mg by mouth daily.    [provider]  metFORMIN (GLUCOPHAGE-XR) 500 MG 24 hr tablet Take 4 tablets (2,000 mg total) by mouth  daily with breakfast. 08/24/20   Romero Belling, MD  METOPROLOL TARTRATE PO Take 50 mg by mouth 2 (two) times daily.    [provider]  Multiple Vitamins-Minerals (MULTIVITAMIN WITH MINERALS) tablet Take 1 tablet by mouth daily.    [provider]  Vitamin D, Ergocalciferol, (DRISDOL) 1.25 MG (50000 UT) CAPS capsule Take 50,000 Units by mouth 2 (two) times a week. 04/23/19   [provider]    Exam: Current vital signs: Pulse 60   Resp (!) 27   SpO2 94%   Physical Exam  Constitutional: Acutely ill appearing morbidly obese male.  Psych: Affect appropriate to situation. Eyes: No scleral injection. HENT: No OP obstruction. Head:  Normocephalic.  Cardiovascular: Normal rate and regular rhythm.  Respiratory: intubated.  GI: Soft.  No distension. Obese. There is no tenderness.  Skin: WDI.  Neuro: Mental Status: Patient is awake. Arousable by loud name calling or tactile stimulation. Able to state his name and age and follow simple commands.  Speech without dysarthria or aphasia.  No neglect.   Visual Fields are full. Pupils are equal, round, and reactive to light. EOMI without ptosis or diploplia.  Facial sensation is symmetric to light touch.  Facial movement is symmetric. No droop.  Hearing is intact to voice. Uvula midline and palate elevates symmetrically. Shoulder shrug is symmetric. Tongue is midline without atrophy or fasciculations.  Tone is normal. Bulk is increased. Moves RUE, LUE, LLE spontaneously. RLE has toe wiggles to noxious stimuli. Later, he moves his RLE spontaneously.  Sensation-no gross evidence of loss of sensation.  Toes are downgoing bilaterally. No twitching, jerking, shaking, or tremors noted.  Gait - Bedrest.   I have reviewed labs in epic and the pertinent results are: K 2.7, creat 1.26, Na 143, Hgb 18.4, glucose 235, INR 1, aPTT 26  I have reviewed the images obtained: CTH/CTA head and neck:  Large volume subarachnoid hemorrhage centered within the basal cisterns. Mild prominence of the ventricles may reflect volume loss or mild hydrocephalus. No evidence of acute infarction. No aneurysm or AVM.   Assessment: 75 yo male with stroke risk factors of uncontrolled HTN, HLD, and IDDM who presented as a code stroke after severe HA with elevated BP. The patient had emesis and required intubation prior to scan. CTH with large volume SAH.  Impression:  [facility], likely hypertensive bleed.  -Uncontrolled HTN, Hypertensive urgency.   Plan: Admit to neuro ICU. -bedrest.  Elevate head of bed keep head midline. Continue clevidipine drip for goal SBP<140. Start 3% Na infusion at 75cc/hr.  Goal serum Na 145-155.  Serum Na every 6 hours. Consult PCCM for vent management.  Consult neurosurgery. Analgosedation with fentanyl. Start prophylactic LEV 1,000mg  bid x 7-10 days. Hold antiplatelets and anticoagulation for now. IV fluids gentle hydration. Repeat CT head in 6 hours (or sooner if clinical worsening). Echocardiogram. MRI brain without contrast. Keep platelets >100k, INR<1.4 Replete electrolytes as needed. Labs: Coags, CBC, type and cross, CMP, Mg, Phos, fasting lipids, hA1c, hCRP, troponins, urinalysis. Normothermia - Acetaminophen for temperature >37.5C Euglycemia (~ <180) Euvolemia - Strict I/Os Precautions: Airway and herniation, seizure, aspiration. PPx: SCDs for now, Senna/docusate, PPI.  This patient is critically ill and at significant risk of neurological worsening, death and care requires constant monitoring of vital signs, hemodynamics,respiratory and cardiac monitoring, neurological assessment, discussion with family, other specialists and medical decision making of high complexity. I spent 75 minutes of neurocritical care time  in the care of  this patient. This was  time spent independent of any time provided by nurse practitioner or PA.  Electronically signed by:  Marisue Humble, MD Page: [phone] Mar 12, 2021, 8:20 AM

## 2021-03-05 NOTE — ED Triage Notes (Signed)
BIB EMS. Pt called. Per EMS LKW 0650, AMS, L facial droop and headache.

## 2021-03-05 NOTE — Progress Notes (Signed)
PT Cancellation Note  Patient Details Name: Connor [name] MRN: [id number] DOB: 1945/08/09   Cancelled Treatment:    Reason Eval/Treat Not Completed: Patient not medically ready - intubated, sedated. Will check back.   Marye Round, PT DPT Acute Rehabilitation Services Pager [phone]  Office [phone]   Truddie Coco 04-02-21, 2:13 PM

## 2021-03-05 NOTE — ED Notes (Signed)
Getting ready to intubate 

## 2021-03-05 NOTE — Progress Notes (Signed)
Spoke with Kirby-Graham, NP and made her aware that patient is maxxed on cleviprex drip and blood pressure remains elevated with systolic BP in 150s. Sedation increased for trip to CT which pt and nurse just returned to room from. NP stated she would order PRN labetalol.

## 2021-03-05 NOTE — Consult Note (Signed)
NAMEBenney [name], MRN:  [id number], DOB:  12-May-1946, LOS: 0 ADMISSION DATE:  03/07/2021, CONSULTATION DATE:  03/12/2021 REFERRING MD:  Thomasena Edis CHIEF COMPLAINT:  AMS   History of Present Illness:  Connor [name] is a 75 y.o. male who has a PMH as outlined below who presented to [facility] ED 9/19 as code stroke.  He woke up his normal self that morning but then had acute onset of severe headache. He called EMS and upon their arrival, he had AMS and left facial droop.  SBP was noted to be 270.  He was brought to ED where CT head demonstrated SAH.  He was intubated for airway protection and was started on Cleviprex for BP control with goal SBP 120 - 140.  Neurology has admitted him to neuro ICU and PCCM asked to assist with vent management.  He is currently on Propofol and Fentanyl for sedation.  Pertinent  Medical History:  has Diabetes (HCC) and ICH (intracerebral hemorrhage) (HCC) on their problem list.  Significant Hospital Events: Including procedures, antibiotic start and stop dates in addition to other pertinent events   9/19 > admit.  Interim History / Subjective:  Sedated on Propofol and Fentanyl but is moving extremities.  Objective:  Blood pressure (!) 154/77, pulse 66, resp. rate 18, height 5\' 10"  (1.778 m), weight 132.9 kg, SpO2 100 %.    Vent Mode: PRVC FiO2 (%):  [40 %-100 %] (P) 40 % Set Rate:  [18 bmp] 18 bmp Vt Set:  [560 mL] 560 mL PEEP:  [5 cmH20] 5 cmH20 Plateau Pressure:  [17 cmH20] 17 cmH20   Intake/Output Summary (Last 24 hours) at 02/28/2021 1028 Last data filed at 02/17/2021 1005 Gross per 24 hour  Intake 179.59 ml  Output 225 ml  Net -45.41 ml   Filed Weights   03/09/2021 0821 03/04/2021 0856  Weight: 132 kg 132.9 kg    Examination: General: Adult male, resting in bed, in NAD. Neuro: Sedated, not responsive to commands but does MAE's. HEENT: [place]/AT. Sclerae anicteric. ETT in place. Cardiovascular: RRR, no M/R/G.  Lungs: Respirations even and unlabored.   CTA bilaterally, No W/R/R. Abdomen: Obese, BS x 4, soft, NT/ND.  Musculoskeletal: No gross deformities, no edema.  Skin: Intact, warm, no rashes.  Labs/imaging personally reviewed:  CTH / CTAH 9/19 > large SAH, no acute infarct or aneurysm.  Resolved Hospital Problem list:    Assessment & Plan:   Large SAH presumed hypertensive. - Management per neuro. - Strict BP control with Clevidipine PRN for goal SBP 120 - 140. - Continue 3% NS with frequent Na checks for goal Na 145 - 155. - Continue empiric Keppra. - F/u on repeat CTH and MRI brain. - Neurosurgery consult pending.  Hx HTN now with hypertensive emergency. - Strict BP goal as above to avoid worsened or recurrent head bleed. - Hold home ASA, Atorvastatin, Hydralazine, Losartan, Metoprolol.  Respiratory insufficiency - due to inability to protect the airway in the setting of above. - Full vent support. - Assess ABG. - Wean as able. - Daily SBT. - Bronchial hygiene. - Follow CXR.  ? Hypokalemia - initial K 2.8 but on iStat 3.5. - Hold repletion and follow K on a repeat BMP (sending now).  Hx DM. - SSI. - Hold home [place], Novolog, Lantus, Metformin.  Best practice (evaluated daily):  Diet/type: NPO DVT prophylaxis: SCD GI prophylaxis: PPI Lines: N/A Foley:  Yes, and it is still needed Code Status:  full code Last date of multidisciplinary  goals of care discussion: Per primary.  Labs   CBC: Recent Labs  Lab 03/03/2021 0810 02/21/2021 0817 03/02/2021 1002  WBC 9.8  --   --   NEUTROABS 4.5  --   --   HGB 17.3* 18.4* 17.3*  HCT 54.7* 54.0* 51.0  MCV 100.2*  --   --   PLT 195  --   --     Basic Metabolic Panel: Recent Labs  Lab 02/21/2021 0810 03/12/2021 0817 02/24/2021 1002  NA 137 143 143  K 2.8* 2.7* 3.5  CL 103 104  --   CO2 21*  --   --   GLUCOSE 239* 235*  --   BUN 11 12  --   CREATININE 1.39* 1.20  --   CALCIUM 9.3  --   --    GFR: Estimated Creatinine Clearance: 75.2 mL/min (by C-G formula  based on SCr of 1.2 mg/dL). Recent Labs  Lab 02/24/2021 0810  WBC 9.8    Liver Function Tests: Recent Labs  Lab 03/04/2021 0810  AST 30  ALT 9  ALKPHOS 52  BILITOT 1.0  PROT 6.5  ALBUMIN 3.6   No results for input(s): LIPASE, AMYLASE in the last 168 hours. No results for input(s): AMMONIA in the last 168 hours.  ABG    Component Value Date/Time   PHART 7.373 02/19/2021 1002   PCO2ART 47.8 03/02/2021 1002   PO2ART 353 (H) 02/24/2021 1002   HCO3 27.8 03/15/2021 1002   TCO2 29 03/09/2021 1002   O2SAT 100.0 03/06/2021 1002     Coagulation Profile: Recent Labs  Lab 03/04/2021 0810  INR 1.0    Cardiac Enzymes: No results for input(s): CKTOTAL, CKMB, CKMBINDEX, TROPONINI in the last 168 hours.  HbA1C: Hemoglobin A1C  Date/Time Value Ref Range Status  08/24/2020 10:33 AM 6.9 (A) 4.0 - 5.6 % Final  05/19/2020 10:47 AM 6.4 (A) 4.0 - 5.6 % Final  06/18/2019 12:00 AM 7.8  Final    CBG: Recent Labs  Lab 03/11/2021 0813  GLUCAP 225*    Review of Systems:   Unable to obtain as pt is encephalopathic.  Past Medical History:  He,  has a past medical history of Arthritis, Diabetes mellitus without complication (HCC), and Hypertension.   Surgical History:  No past surgical history on file.   Social History:   reports that he quit smoking about 27 years ago. His smoking use included cigarettes. He has a 6.00 pack-year smoking history. He has never used smokeless tobacco. He reports that he does not drink alcohol and does not use drugs.   Family History:  His family history is not on file.   Allergies No Known Allergies   Home Medications  Prior to Admission medications   Medication Sig Start Date End Date Taking? Authorizing Provider  aspirin EC 81 MG tablet Take 81 mg by mouth daily.    [provider]  atorvastatin (LIPITOR) 80 MG tablet Take 80 mg by mouth daily.    [provider]  dapagliflozin propanediol (FARXIGA) 10 MG TABS tablet Take 1  tablet (10 mg total) by mouth daily before breakfast. 03/20/20   Romero Belling, MD  glucose 4 GM chewable tablet Chew 1 tablet by mouth as needed for low blood sugar.    [provider]  hydrALAZINE (APRESOLINE) 100 MG tablet Take 100 mg by mouth 3 (three) times daily.    [provider]  insulin aspart (NOVOLOG FLEXPEN) 100 UNIT/ML FlexPen 3 times a day (just before  each meal) 05-27-11 units 08/24/20   Romero Belling, MD  insulin glargine (LANTUS) 100 UNIT/ML injection Inject 0.35 mLs (35 Units total) into the skin at bedtime. 05/19/20   Romero Belling, MD  losartan (COZAAR) 100 MG tablet Take 100 mg by mouth daily.    [provider]  metFORMIN (GLUCOPHAGE-XR) 500 MG 24 hr tablet Take 4 tablets (2,000 mg total) by mouth daily with breakfast. 08/24/20   Romero Belling, MD  METOPROLOL TARTRATE PO Take 50 mg by mouth 2 (two) times daily.    [provider]  Multiple Vitamins-Minerals (MULTIVITAMIN WITH MINERALS) tablet Take 1 tablet by mouth daily.    [provider]  Vitamin D, Ergocalciferol, (DRISDOL) 1.25 MG (50000 UT) CAPS capsule Take 50,000 Units by mouth 2 (two) times a week. 04/23/19   [provider]     Critical care time: 40 min.   Rutherford Guys, PA - [facility] For pager details, please see AMION or use Epic chat  After 1900, please call [facility] for cross coverage needs 2021/03/31, 10:28 AM

## 2021-03-05 NOTE — Consult Note (Signed)
Reason for Consult: Subarachnoid hemorrhage Referring Physician: Stroke team  Connor [name] is an 75 y.o. male.  HPI: 75 year old male with multiple severe medical problems presents with acute onset of severe headache and declining level of consciousness.  Patient will awaken to voice in the emergency department.  He would not follow commands.  He is minimally verbal.  Work-up demonstrates evidence of significant basilar subarachnoid hemorrhage without ventricular extension.  No evidence of parenchymal hemorrhage or obvious stroke.  Patient severely hypertensive on arrival.  CT angiogram negative for aneurysm or AVM.  MRI scan pending.  Patient has been intubated for airway control and sedation.  Attempts underway at managing blood pressure.  No history of recent trauma.  Past Medical History:  Diagnosis Date   Arthritis    knees   Diabetes mellitus without complication (HCC)    Hypertension     No past surgical history on file.  No family history on file.  Social History:  reports that he quit smoking about 27 years ago. His smoking use included cigarettes. He has a 6.00 pack-year smoking history. He has never used smokeless tobacco. He reports that he does not drink alcohol and does not use drugs.  Allergies: No Known Allergies  Medications: I have reviewed the patient's current medications.  Results for orders placed or performed during the hospital encounter of March 17, 2021 (from the past 48 hour(s))  Protime-INR     Status: None   Collection Time: 03/17/21  8:10 AM  Result Value Ref Range   Prothrombin Time 13.4 11.4 - 15.2 seconds   INR 1.0 0.8 - 1.2    Comment: (NOTE) INR goal varies based on device and disease states. Performed at [facility], [address], [place], [place] [address]   APTT     Status: None   Collection Time: 03-17-21  8:10 AM  Result Value Ref Range   aPTT 26 24 - 36 seconds    Comment: Performed at [facility], [address],  [place], [place] [address]  CBC     Status: Abnormal   Collection Time: 2021-03-17  8:10 AM  Result Value Ref Range   WBC 9.8 4.0 - 10.5 K/uL   RBC 5.46 4.22 - 5.81 MIL/uL   Hemoglobin 17.3 (H) 13.0 - 17.0 g/dL   HCT 56.3 (H) 87.5 - 64.3 %   MCV 100.2 (H) 80.0 - 100.0 fL   MCH 31.7 26.0 - 34.0 pg   MCHC 31.6 30.0 - 36.0 g/dL   RDW 32.9 51.8 - 84.1 %   Platelets 195 150 - 400 K/uL   nRBC 0.0 0.0 - 0.2 %    Comment: Performed at [facility], [address] [place], [place] [address]  Differential     Status: Abnormal   Collection Time: 2021/03/17  8:10 AM  Result Value Ref Range   Neutrophils Relative % 45 %   Neutro Abs 4.5 1.7 - 7.7 K/uL   Lymphocytes Relative 39 %   Lymphs Abs 3.8 0.7 - 4.0 K/uL   Monocytes Relative 11 %   Monocytes Absolute 1.1 (H) 0.1 - 1.0 K/uL   Eosinophils Relative 3 %   Eosinophils Absolute 0.3 0.0 - 0.5 K/uL   Basophils Relative 1 %   Basophils Absolute 0.1 0.0 - 0.1 K/uL   Immature Granulocytes 1 %   Abs Immature Granulocytes 0.06 0.00 - 0.07 K/uL    Comment: Performed at [facility], [address] [place],  [place] [address]  Comprehensive metabolic panel     Status: Abnormal   Collection Time: 03-19-21  8:10 AM  Result Value Ref Range   Sodium 137 135 - 145 mmol/L   Potassium 2.8 (L) 3.5 - 5.1 mmol/L   Chloride 103 98 - 111 mmol/L   CO2 21 (L) 22 - 32 mmol/L   Glucose, Bld 239 (H) 70 - 99 mg/dL    Comment: Glucose reference range applies only to samples taken after fasting for at least 8 hours.   BUN 11 8 - 23 mg/dL   Creatinine, Ser 0.07 (H) 0.61 - 1.24 mg/dL   Calcium 9.3 8.9 - 62.2 mg/dL   Total Protein 6.5 6.5 - 8.1 g/dL   Albumin 3.6 3.5 - 5.0 g/dL   AST 30 15 - 41 U/L   ALT 9 0 - 44 U/L   Alkaline Phosphatase 52 38 - 126 U/L   Total Bilirubin 1.0 0.3 - 1.2 mg/dL   GFR, Estimated 54 (L) >60 mL/min    Comment: (NOTE) Calculated using the CKD-EPI Creatinine Equation (2021)    Anion gap 13 5 - 15    Comment: Performed at [facility], [address], [place], [place] [address]  CBG monitoring, ED     Status: Abnormal   Collection Time: 03-19-2021  8:13 AM  Result Value Ref Range   Glucose-Capillary 225 (H) 70 - 99 mg/dL    Comment: Glucose reference range applies only to samples taken after fasting for at least 8 hours.  I-stat chem 8, ED     Status: Abnormal   Collection Time: 2021-03-19  8:17 AM  Result Value Ref Range   Sodium 143 135 - 145 mmol/L   Potassium 2.7 (LL) 3.5 - 5.1 mmol/L   Chloride 104 98 - 111 mmol/L   BUN 12 8 - 23 mg/dL   Creatinine, Ser 4.56 0.61 - 1.24 mg/dL   Glucose, Bld 256 (H) 70 - 99 mg/dL    Comment: Glucose reference range applies only to samples taken after fasting for at least 8 hours.   Calcium, Ion 1.26 1.15 - 1.40 mmol/L   TCO2 24 22 - 32 mmol/L   Hemoglobin 18.4 (H) 13.0 - 17.0 g/dL   HCT 38.9 (H) 37.3 - 42.8 %   Comment NOTIFIED PHYSICIAN   Resp Panel by RT-PCR (Flu A&B, Covid) Nasopharyngeal Swab     Status: None   Collection Time: 19-Mar-2021  9:44 AM   Specimen: Nasopharyngeal Swab; Nasopharyngeal(NP) swabs in vial transport medium  Result Value Ref Range   SARS Coronavirus 2 by RT PCR NEGATIVE NEGATIVE    Comment: (NOTE) SARS-CoV-2 target nucleic acids are NOT DETECTED.  The SARS-CoV-2 RNA is generally detectable in upper respiratory specimens during the acute phase of infection. The lowest concentration of SARS-CoV-2 viral copies this assay can detect is 138 copies/mL. A negative result does not preclude SARS-Cov-2 infection and should not be used as the sole basis for treatment or other patient management decisions. A negative result may occur with  improper specimen collection/handling, submission of specimen other than nasopharyngeal swab, presence of viral mutation(s) within the areas targeted by this assay, and inadequate number of viral copies(<138 copies/mL). A negative result must be combined with clinical observations, patient history, and  epidemiological information. The expected result is Negative.  Fact Sheet for Patients:  BloggerCourse.com  Fact Sheet for Healthcare Providers:  SeriousBroker.it  This test is no t yet approved or cleared by the [place]  FDA and  has been authorized for detection and/or diagnosis of SARS-CoV-2 by FDA under an Emergency Use Authorization (EUA). This EUA will remain  in effect (meaning this test can be used) for the duration of the COVID-19 declaration under Section 564(b)(1) of the Act, 21 U.S.C.section 360bbb-3(b)(1), unless the authorization is terminated  or revoked sooner.       Influenza A by PCR NEGATIVE NEGATIVE   Influenza B by PCR NEGATIVE NEGATIVE    Comment: (NOTE) The Xpert Xpress SARS-CoV-2/FLU/RSV plus assay is intended as an aid in the diagnosis of influenza from Nasopharyngeal swab specimens and should not be used as a sole basis for treatment. Nasal washings and aspirates are unacceptable for Xpert Xpress SARS-CoV-2/FLU/RSV testing.  Fact Sheet for Patients: BloggerCourse.com  Fact Sheet for Healthcare Providers: SeriousBroker.it  This test is not yet approved or cleared by the [place] FDA and has been authorized for detection and/or diagnosis of SARS-CoV-2 by FDA under an Emergency Use Authorization (EUA). This EUA will remain in effect (meaning this test can be used) for the duration of the COVID-19 declaration under Section 564(b)(1) of the Act, 21 U.S.C. section 360bbb-3(b)(1), unless the authorization is terminated or revoked.  Performed at [facility], [address] [place], [place] [address]   I-Stat arterial blood gas, ED     Status: Abnormal   Collection Time: 03/10/2021 10:02 AM  Result Value Ref Range   pH, Arterial 7.373 7.350 - 7.450   pCO2 arterial 47.8 32.0 - 48.0 mmHg   pO2, Arterial 353 (H) 83.0 - 108.0 mmHg    Bicarbonate 27.8 20.0 - 28.0 mmol/L   TCO2 29 22 - 32 mmol/L   O2 Saturation 100.0 %   Acid-Base Excess 2.0 0.0 - 2.0 mmol/L   Sodium 143 135 - 145 mmol/L   Potassium 3.5 3.5 - 5.1 mmol/L   Calcium, Ion 1.28 1.15 - 1.40 mmol/L   HCT 51.0 39.0 - 52.0 %   Hemoglobin 17.3 (H) 13.0 - 17.0 g/dL   Patient temperature 28.7 F    Collection site Radial    Drawn by RT    Sample type ARTERIAL   Glucose, capillary     Status: Abnormal   Collection Time: 03/03/2021 10:29 AM  Result Value Ref Range   Glucose-Capillary 242 (H) 70 - 99 mg/dL    Comment: Glucose reference range applies only to samples taken after fasting for at least 8 hours.  Hemoglobin A1c     Status: Abnormal   Collection Time: 02/24/2021 11:29 AM  Result Value Ref Range   Hgb A1c MFr Bld 7.2 (H) 4.8 - 5.6 %    Comment: (NOTE) Pre diabetes:          5.7%-6.4%  Diabetes:              >6.4%  Glycemic control for   <7.0% adults with diabetes    Mean Plasma Glucose 159.94 mg/dL    Comment: Performed at [facility], [address], [place], [place] [address]  Glucose, capillary     Status: Abnormal   Collection Time: 02/17/2021 11:52 AM  Result Value Ref Range   Glucose-Capillary 251 (H) 70 - 99 mg/dL    Comment: Glucose reference range applies only to samples taken after fasting for at least 8 hours.    DG Chest Portable 1 View  Result Date: 03/16/2021 CLINICAL DATA:  Intubated EXAM: PORTABLE CHEST 1 VIEW COMPARISON:  None FINDINGS: Endotracheal tube is 3 cm  above the carina. Possible enteric tube coiled in the pharynx. Low lung volumes with crowding of markings. No consolidation or edema. No pleural effusion. Normal heart size. IMPRESSION: Endotracheal tube in place. Possible enteric tube coiled in the pharynx. No acute abnormality. Electronically Signed   By: Guadlupe Spanish M.D.   On: 02/22/2021 08:51   CT HEAD CODE STROKE WO CONTRAST  Result Date: 03/04/2021 CLINICAL DATA:  Neuro deficit, acute, stroke suspected EXAM:  CT ANGIOGRAPHY HEAD AND NECK TECHNIQUE: Multidetector CT imaging of the head and neck was performed using the standard protocol during bolus administration of intravenous contrast. Multiplanar CT image reconstructions and MIPs were obtained to evaluate the vascular anatomy. Carotid stenosis measurements (when applicable) are obtained utilizing NASCET criteria, using the distal internal carotid diameter as the denominator. CONTRAST:  75 mL Omnipaque 350 COMPARISON:  None. FINDINGS: CT HEAD FINDINGS Brain: Large volume subarachnoid hemorrhage is present within the basal cisterns. This extends inferiorly along the brainstem and laterally into the left greater than right sylvian fissures. Scattered parasagittal sulcal subarachnoid hemorrhage. Mild prominence of the ventricles may reflect volume loss or mild hydrocephalus. Gray-white differentiation is preserved. No extra-axial collection. Vascular: Better evaluated on CTA portion. Skull: Unremarkable. Sinuses: Minor mucosal thickening. Orbits: Left lens replacement. Review of the MIP images confirms the above findings CTA NECK FINDINGS Aortic arch: Minor plaque along the arch. Great vessel origins are patent. Right carotid system: Patent. Mild calcified plaque along the distal common carotid and proximal internal carotid. No stenosis. Left carotid system: Patent.  No stenosis. Vertebral arteries: Patent.  No stenosis. Skeleton: No acute osseous abnormality. Other neck: Enteric tube is coiled in the pharynx and proximal esophagus. Upper chest: Included upper lungs are clear. Review of the MIP images confirms the above findings CTA HEAD FINDINGS Anterior circulation: Intracranial internal carotid arteries are patent with mild calcified plaque along the cavernous segments. Additional mild calcified plaque along the paraclinoid portions. No high-grade stenosis. Anterior and middle cerebral arteries are patent. No aneurysm identified. Posterior circulation: Intracranial  vertebral arteries are patent. Basilar artery is patent. Major cerebellar artery origins are patent. Posterior cerebral arteries are patent. Venous sinuses: As permitted by contrast timing, patent. Review of the MIP images confirms the above findings IMPRESSION: Large volume subarachnoid hemorrhage centered within the basal cisterns. Mild prominence of the ventricles may reflect volume loss or mild hydrocephalus. No evidence of acute infarction. No aneurysm or AVM. These results were communicated to Dr. Thomasena Edis at 8:56 am on 03/06/2021 by text page via the [facility] messaging system. Electronically Signed   By: Guadlupe Spanish M.D.   On: 03/12/2021 09:06   CT ANGIO HEAD NECK W WO CM (CODE STROKE)  Result Date: 03/12/2021 CLINICAL DATA:  Neuro deficit, acute, stroke suspected EXAM: CT ANGIOGRAPHY HEAD AND NECK TECHNIQUE: Multidetector CT imaging of the head and neck was performed using the standard protocol during bolus administration of intravenous contrast. Multiplanar CT image reconstructions and MIPs were obtained to evaluate the vascular anatomy. Carotid stenosis measurements (when applicable) are obtained utilizing NASCET criteria, using the distal internal carotid diameter as the denominator. CONTRAST:  75 mL Omnipaque 350 COMPARISON:  None. FINDINGS: CT HEAD FINDINGS Brain: Large volume subarachnoid hemorrhage is present within the basal cisterns. This extends inferiorly along the brainstem and laterally into the left greater than right sylvian fissures. Scattered parasagittal sulcal subarachnoid hemorrhage. Mild prominence of the ventricles may reflect volume loss or mild hydrocephalus. Gray-white differentiation is preserved. No extra-axial collection. Vascular: Better evaluated on CTA  portion. Skull: Unremarkable. Sinuses: Minor mucosal thickening. Orbits: Left lens replacement. Review of the MIP images confirms the above findings CTA NECK FINDINGS Aortic arch: Minor plaque along the arch. Great vessel  origins are patent. Right carotid system: Patent. Mild calcified plaque along the distal common carotid and proximal internal carotid. No stenosis. Left carotid system: Patent.  No stenosis. Vertebral arteries: Patent.  No stenosis. Skeleton: No acute osseous abnormality. Other neck: Enteric tube is coiled in the pharynx and proximal esophagus. Upper chest: Included upper lungs are clear. Review of the MIP images confirms the above findings CTA HEAD FINDINGS Anterior circulation: Intracranial internal carotid arteries are patent with mild calcified plaque along the cavernous segments. Additional mild calcified plaque along the paraclinoid portions. No high-grade stenosis. Anterior and middle cerebral arteries are patent. No aneurysm identified. Posterior circulation: Intracranial vertebral arteries are patent. Basilar artery is patent. Major cerebellar artery origins are patent. Posterior cerebral arteries are patent. Venous sinuses: As permitted by contrast timing, patent. Review of the MIP images confirms the above findings IMPRESSION: Large volume subarachnoid hemorrhage centered within the basal cisterns. Mild prominence of the ventricles may reflect volume loss or mild hydrocephalus. No evidence of acute infarction. No aneurysm or AVM. These results were communicated to Dr. Thomasena Edis at 8:56 am on 02/25/2021 by text page via the [facility] messaging system. Electronically Signed   By: Guadlupe Spanish M.D.   On: 03/11/2021 09:06    Review of systems not obtained due to patient factors. Blood pressure (!) 131/52, pulse 85, temperature (!) 96.8 F (36 C), temperature source Axillary, resp. rate 16, height 5\' 10"  (1.778 m), weight 132.9 kg, SpO2 99 %. Patient is intubated and deeply sedated.  He shows no signs of awakening to noxious stimuli.  Pupils are small and reactive bilaterally.  Gaze is conjugate.  No evidence of downward gaze preference.  No movement of extremities to deep stimuli.  No evidence of any external  trauma to his head ears eyes or throat.  Chest and abdomen obese but otherwise benign.  Extremities free of major deformity.  Assessment/Plan: Grade 4 subarachnoid hemorrhage which does not appear to be secondary to aneurysm.  Patient without evidence of hydrocephalus at this time.  Should hydrocephalus occur it almost certainly would be communicating and not obstructive.  I do not feel there is any need for prophylactic placement of ventriculostomy at this point.  Continue with medical management of his hypertension and supportive care.  A [place] 02/24/2021, 12:07 PM

## 2021-03-05 NOTE — Progress Notes (Signed)
Dr. Merrily Pew gave order via secure chat that okay to use OG tube per xray.

## 2021-03-05 NOTE — Code Documentation (Signed)
Stroke Response Nurse Documentation Code Documentation  Connor [name] is a 76 y.o. male arriving to [facility] ED via [facility] on 03/11/2021 with past medical hx of HTN, DM. On aspirin 81 mg daily. Code stroke was activated by EMS.   Patient from home where he was LKW at [phone] and now complaining of thunderclap headache, AMS, left facial droop, and vomiting.  Stroke team at the bedside on patient arrival. Labs drawn and taken to ED31 to be intubated by Dr. Donnald Garre for airway protection. Patient to CT with team. NIHSS 24, see documentation for details and code stroke times. Patient with decreased LOC, disoriented, not following commands, bilateral hemianopia, bilateral arm weakness, bilateral leg weakness, bilateral decreased sensation, and Global aphasia  on exam. The following imaging was completed:  CT, CTA head and neck. Patient is not a candidate for IV Thrombolytic due to subarachnoid hemorrhage. Patient is not a candidate for IR.   Care/Plan: mNIHSS q24min x2h, q31min x6h, q1h x 16h; SBP <140.   Bedside handoff with ED RN Byrd Hesselbach.    Scarlette Slice K  Stroke Response RN

## 2021-03-05 NOTE — Plan of Care (Signed)
NP called consult to PCCM for vent management and spoke to NP.   NP called consult to [facility] for large volume SAH. Dr. Dutch Quint stated he read the films, no aneurysm, await MRI brain and they will follow along with [place].   NP attempted to call patient's brother listed as a contact in chart, but # no longer in service.   Jimmye Norman, MSN, APN-BC Neurology Nurse Practitioner Pager [phone]

## 2021-03-06 ENCOUNTER — Inpatient Hospital Stay (HOSPITAL_COMMUNITY): Payer: Medicare HMO

## 2021-03-06 DIAGNOSIS — I609 Nontraumatic subarachnoid hemorrhage, unspecified: Secondary | ICD-10-CM | POA: Diagnosis not present

## 2021-03-06 DIAGNOSIS — G9340 Encephalopathy, unspecified: Secondary | ICD-10-CM

## 2021-03-06 DIAGNOSIS — J9601 Acute respiratory failure with hypoxia: Secondary | ICD-10-CM | POA: Diagnosis not present

## 2021-03-06 DIAGNOSIS — I6389 Other cerebral infarction: Secondary | ICD-10-CM | POA: Diagnosis not present

## 2021-03-06 DIAGNOSIS — I161 Hypertensive emergency: Secondary | ICD-10-CM | POA: Diagnosis not present

## 2021-03-06 DIAGNOSIS — I615 Nontraumatic intracerebral hemorrhage, intraventricular: Secondary | ICD-10-CM

## 2021-03-06 LAB — LIPID PANEL
Cholesterol: 85 mg/dL (ref 0–200)
HDL: 28 mg/dL — ABNORMAL LOW (ref >40)
LDL Cholesterol: 24 mg/dL (ref 0–99)
Total CHOL/HDL Ratio: 3 RATIO
Triglycerides: 167 mg/dL — ABNORMAL HIGH (ref <150)
VLDL: 33 mg/dL (ref 0–40)

## 2021-03-06 LAB — COMPREHENSIVE METABOLIC PANEL
ALT: <5 U/L (ref 0–44)
AST: 57 U/L — ABNORMAL HIGH (ref 15–41)
Albumin: 3.1 g/dL — ABNORMAL LOW (ref 3.5–5.0)
Alkaline Phosphatase: 47 U/L (ref 38–126)
Anion gap: 8 (ref 5–15)
BUN: 15 mg/dL (ref 8–23)
CO2: 20 mmol/L — ABNORMAL LOW (ref 22–32)
Calcium: 8.8 mg/dL — ABNORMAL LOW (ref 8.9–10.3)
Chloride: 116 mmol/L — ABNORMAL HIGH (ref 98–111)
Creatinine, Ser: 1.48 mg/dL — ABNORMAL HIGH (ref 0.61–1.24)
GFR, Estimated: 50 mL/min — ABNORMAL LOW (ref >60)
Glucose, Bld: 184 mg/dL — ABNORMAL HIGH (ref 70–99)
Potassium: 5.8 mmol/L — ABNORMAL HIGH (ref 3.5–5.1)
Sodium: 144 mmol/L (ref 135–145)
Total Bilirubin: 1.7 mg/dL — ABNORMAL HIGH (ref 0.3–1.2)
Total Protein: 5.2 g/dL — ABNORMAL LOW (ref 6.5–8.1)

## 2021-03-06 LAB — GLUCOSE, CAPILLARY
Glucose-Capillary: 168 mg/dL — ABNORMAL HIGH (ref 70–99)
Glucose-Capillary: 171 mg/dL — ABNORMAL HIGH (ref 70–99)
Glucose-Capillary: 163 mg/dL — ABNORMAL HIGH (ref 70–99)
Glucose-Capillary: 172 mg/dL — ABNORMAL HIGH (ref 70–99)
Glucose-Capillary: 191 mg/dL — ABNORMAL HIGH (ref 70–99)
Glucose-Capillary: 212 mg/dL — ABNORMAL HIGH (ref 70–99)

## 2021-03-06 LAB — BASIC METABOLIC PANEL
Anion gap: 7 (ref 5–15)
BUN: 15 mg/dL (ref 8–23)
CO2: 22 mmol/L (ref 22–32)
Calcium: 8.9 mg/dL (ref 8.9–10.3)
Chloride: 120 mmol/L — ABNORMAL HIGH (ref 98–111)
Creatinine, Ser: 1.52 mg/dL — ABNORMAL HIGH (ref 0.61–1.24)
GFR, Estimated: 48 mL/min — ABNORMAL LOW (ref >60)
Glucose, Bld: 163 mg/dL — ABNORMAL HIGH (ref 70–99)
Potassium: 3.8 mmol/L (ref 3.5–5.1)
Sodium: 149 mmol/L — ABNORMAL HIGH (ref 135–145)

## 2021-03-06 LAB — ECHOCARDIOGRAM COMPLETE
AR max vel: 3.36 cm2
AV Area VTI: 4.4 cm2
AV Area mean vel: 3.18 cm2
AV Mean grad: 9 mmHg
AV Peak grad: 15.4 mmHg
Ao pk vel: 1.96 m/s
Area-P 1/2: 2.46 cm2
Height: 70 in
S' Lateral: 2.4 cm
Weight: 4599.68 oz

## 2021-03-06 LAB — SODIUM
Sodium: 146 mmol/L — ABNORMAL HIGH (ref 135–145)
Sodium: 150 mmol/L — ABNORMAL HIGH (ref 135–145)
Sodium: 150 mmol/L — ABNORMAL HIGH (ref 135–145)

## 2021-03-06 LAB — CBC
HCT: 50.7 % (ref 39.0–52.0)
Hemoglobin: 16.8 g/dL (ref 13.0–17.0)
MCH: 32.8 pg (ref 26.0–34.0)
MCHC: 33.1 g/dL (ref 30.0–36.0)
MCV: 99 fL (ref 80.0–100.0)
Platelets: 147 10*3/uL — ABNORMAL LOW (ref 150–400)
RBC: 5.12 MIL/uL (ref 4.22–5.81)
RDW: 14.1 % (ref 11.5–15.5)
WBC: 12 10*3/uL — ABNORMAL HIGH (ref 4.0–10.5)
nRBC: 0 % (ref 0.0–0.2)

## 2021-03-06 LAB — MAGNESIUM: Magnesium: 2.1 mg/dL (ref 1.7–2.4)

## 2021-03-06 LAB — PHOSPHORUS: Phosphorus: 2.1 mg/dL — ABNORMAL LOW (ref 2.5–4.6)

## 2021-03-06 LAB — TRIGLYCERIDES: Triglycerides: 156 mg/dL — ABNORMAL HIGH (ref <150)

## 2021-03-06 IMAGING — MR MR HEAD W/O CM
12 of 14 series · 40 of 48 positions shown · non-contrast
Comparison: Prior CT from [DATE] as well as previous studies.

CLINICAL DATA: Follow-up examination for intracranial hemorrhage.

EXAM:
MRI HEAD WITHOUT CONTRAST
MRA HEAD WITHOUT CONTRAST
TECHNIQUE: Multiplanar, multi-echo pulse sequences of the brain and surrounding
structures were acquired without intravenous contrast. Angiographic
images of the Circle of Willis were acquired using MRA technique
without intravenous contrast.

[Series 5: DWI · axial · 3.0mm · 0.96mm/px · z∈[-91,+76]mm · 6 of 116 slices shown (1 of 4)]
[im 1/116]
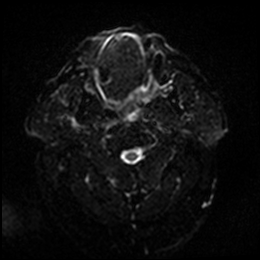
[im 24/116]
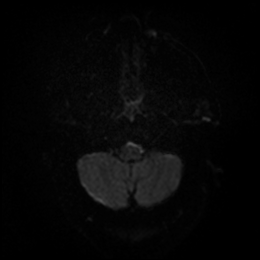
[im 47/116]
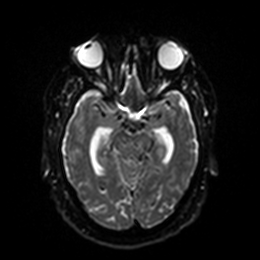
[im 70/116]
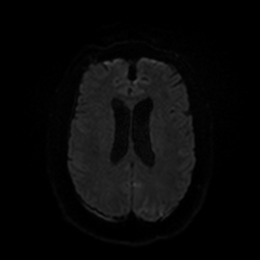
[im 93/116]
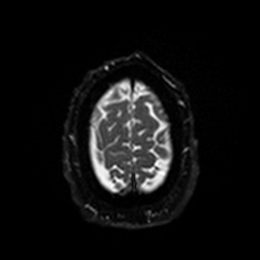
[im 116/116]
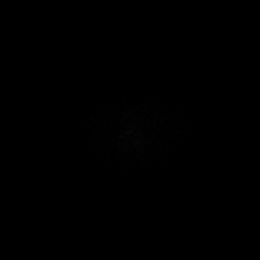

[Series 6: DWI · axial · 3.0mm · 0.96mm/px · z∈[-91,+73]mm · 3 of 57 slices shown (2 of 4)]
[im 1/57]
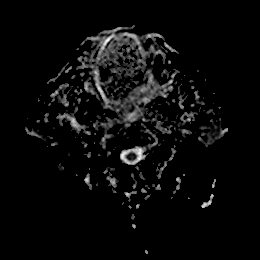
[im 29/57]
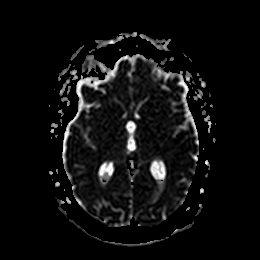
[im 57/57]
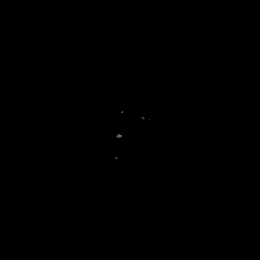

[Series 7: DWI · coronal · 4.0mm · 0.88mm/px · 5 of 84 slices shown (3 of 4)]
[im 1/84]
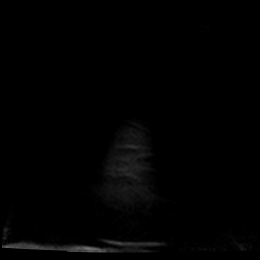
[im 21/84]
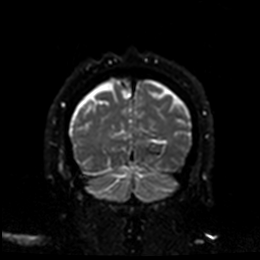
[im 42/84]
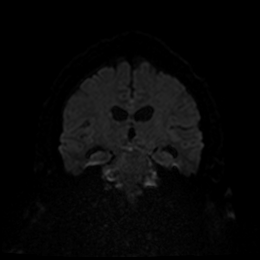
[im 63/84]
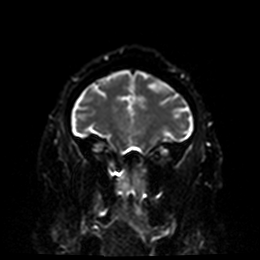
[im 84/84]
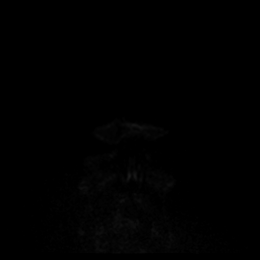

[Series 8: DWI · coronal · 4.0mm · 0.88mm/px · 3 of 42 slices shown (4 of 4)]
[im 1/42]
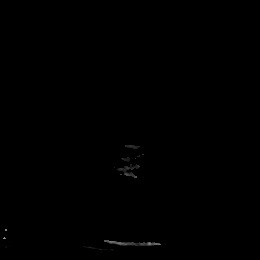
[im 21/42]
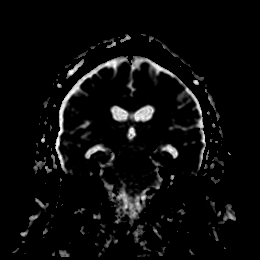
[im 42/42]
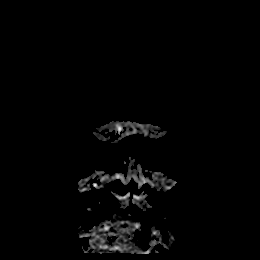

[Series 9: T1 · sagittal · 5.0mm · 0.78mm/px · 2 of 30 slices shown]
[im 1/30]
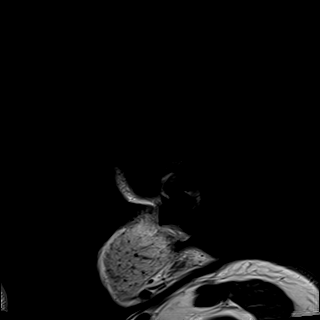
[im 30/30]
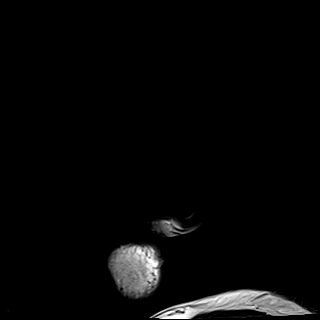

[Series 10: T2 · axial · 5.0mm · 0.78mm/px · z∈[-90,+79]mm · 2 of 30 slices shown (1 of 2)]
[im 1/30]
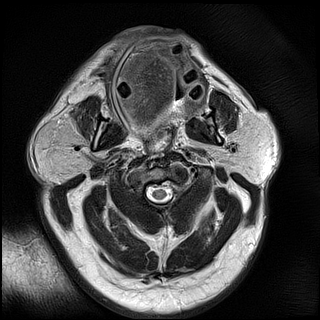
[im 30/30]
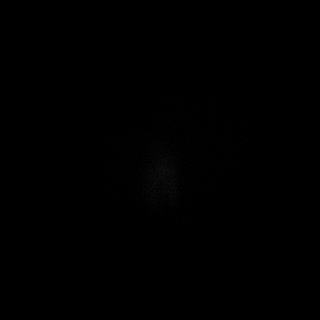

[Series 11: FLAIR · axial · 5.0mm · 0.49mm/px · z∈[-86,+83]mm · 2 of 30 slices shown]
[im 1/30]
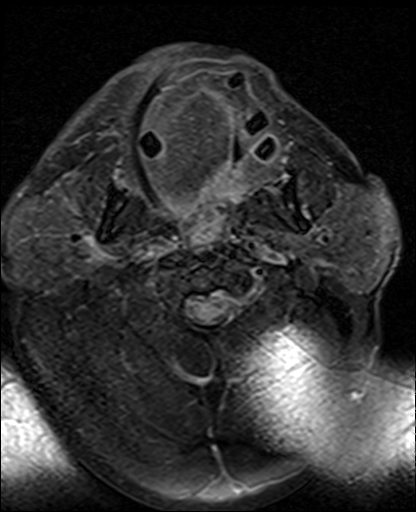
[im 30/30]
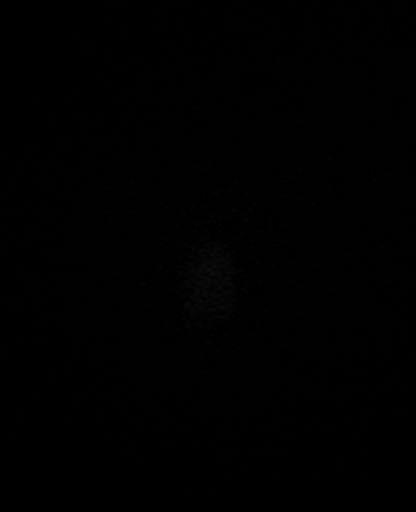

[Series 12: mag_images · axial · 3.0mm · 0.98mm/px · z∈[-90,+83]mm · 4 of 60 slices shown]
[im 1/60]
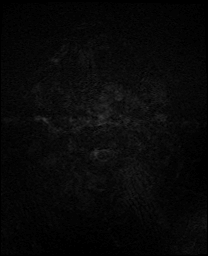
[im 20/60]
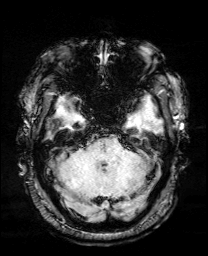
[im 40/60]
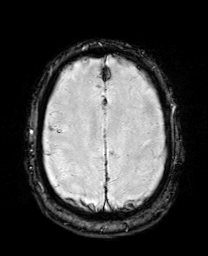
[im 60/60]
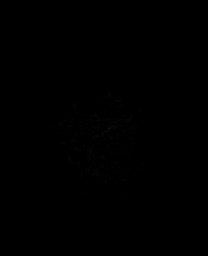

[Series 13: pha_images · axial · 3.0mm · 0.98mm/px · z∈[-90,+80]mm · 4 of 59 slices shown]
[im 1/59]
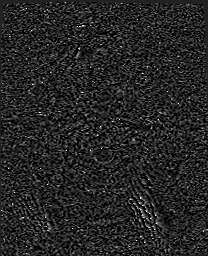
[im 20/59]
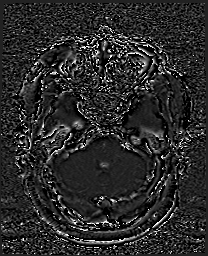
[im 39/59]
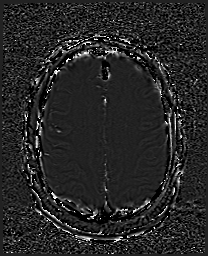
[im 59/59]
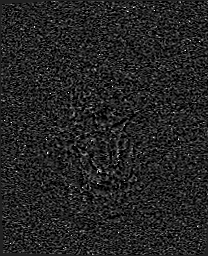

[Series 14: swi_images · axial · 3.0mm · 0.98mm/px · z∈[-90,+83]mm · 4 of 60 slices shown]
[im 1/60]
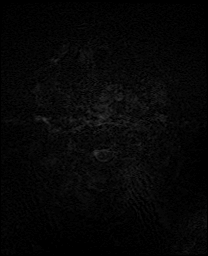
[im 20/60]
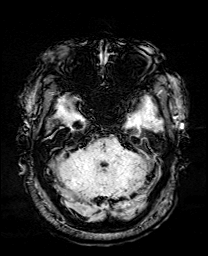
[im 40/60]
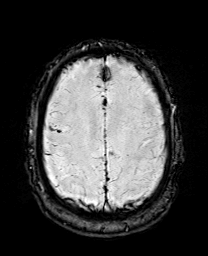
[im 60/60]
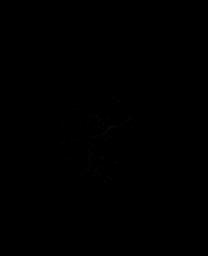

[Series 15: mip_images(sw) · axial · 24.0mm · 0.98mm/px · z∈[-79,+72]mm · 3 of 53 slices shown]
[im 1/53]
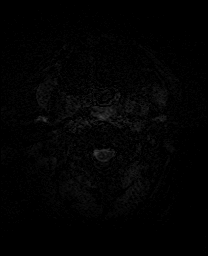
[im 27/53]
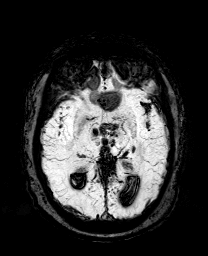
[im 53/53]
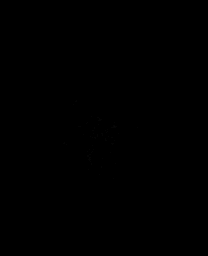

[Series 17: T2 · coronal · 5.0mm · 0.34mm/px · 2 of 36 slices shown (2 of 2)]
[im 1/36]
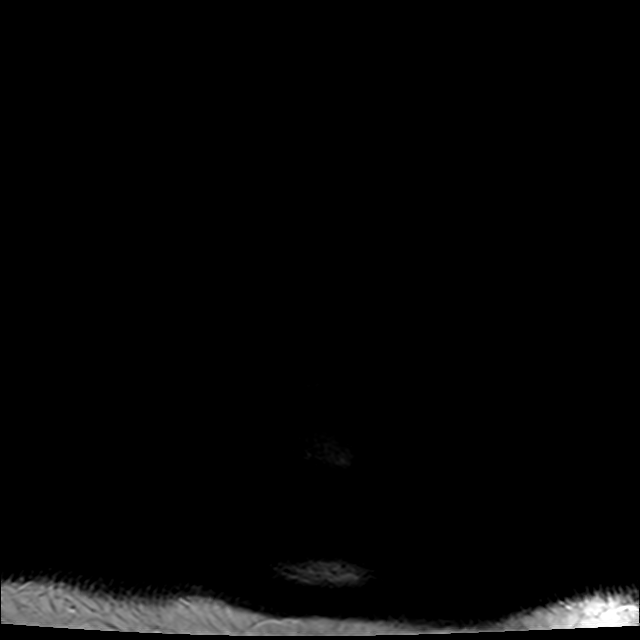
[im 36/36]
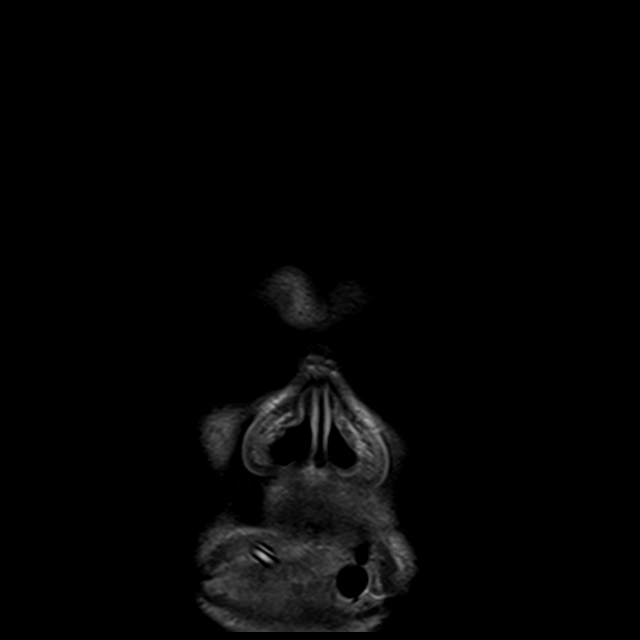

[40 of 48 positions shown; findings below may reference images not displayed]

FINDINGS: MRI HEAD FINDINGS

Brain: Again seen is large volume subarachnoid hemorrhage scattered
throughout the brain, but most pronounced at the basilar cisterns.
Overall appearance and volume is relatively similar to previous CT.
Intraventricular extension with blood layering within the occipital
horns of both lateral ventricles, third ventricle, and fourth
ventricle, consistent with redistribution. Ventricular size is
stable from prior without worsened hydrocephalus. No transependymal
flow of CSF. Extra-axial extension with small subdural collections
overlying the bilateral cerebral convexities, measuring up to 3-4 mm
bilaterally, right slightly larger than left. No significant mass
effect or midline shift. There is a suspected 7 mm acute
intraparenchymal hemorrhage centered at the ventral right thalamus
(series 18, image 34), also seen on prior CT in retrospect. No
significant surrounding edema or other underlying abnormality.
Location is most characteristic of a hypertensive bleed. No other
convincing intraparenchymal hemorrhage is seen, although a few
additional scattered foci of susceptibility artifact noted about the
left thalamus, midbrain, and pons.

No evidence for acute or subacute infarct. Gray-white matter
differentiation maintained. No encephalomalacia to suggest chronic
cortical infarction. No mass lesion. Pituitary gland suprasellar
region grossly normal. Midline structures intact and normal.

Vascular: Major intracranial vascular flow voids are maintained.

Skull and upper cervical spine: Craniocervical junction within
normal limits. Bone marrow signal intensity normal. No scalp soft
tissue abnormality.

Sinuses/Orbits: Prior ocular lens replacement noted on the left.
Globes and orbital soft tissues demonstrate no acute finding.
Scattered mucosal thickening noted throughout the paranasal sinuses.
Patient is intubated. Mastoid air cells remain largely clear.

Other: None.

MRA HEAD FINDINGS

Anterior circulation: Visualized distal cervical segments of the
internal carotid arteries are patent with antegrade flow. Petrous,
cavernous, and supraclinoid segments patent without stenosis or
other abnormality. A1 segments patent bilaterally. Normal anterior
communicating artery complex. Anterior cerebral arteries widely
patent to their distal aspects without stenosis. No M1 stenosis or
occlusion. Normal MCA bifurcations. Distal MCA branches well
perfused and symmetric.

Posterior circulation: Both V4 segments patent to the
vertebrobasilar junction without stenosis. Left vertebral artery
dominant. Both PICA origins patent and normal. Basilar patent to its
distal aspect without stenosis. Superior cerebellar arteries patent
bilaterally. Both PCAs primarily supplied via the basilar well
perfused to their distal aspects.

Anatomic variants: None significant. No intracranial aneurysm, AVM,
or other vascular abnormality.
IMPRESSION: MRI HEAD IMPRESSION:

1. Large volume subarachnoid hemorrhage scattered throughout the
brain, most pronounced at the basilar cisterns. Intraventricular
extension consistent with redistribution. Ventricular size is stable
from previous without worsened hydrocephalus.
2. Extra-axial extension with small volume subdural collections
overlying the bilateral cerebral convexities, right slightly larger
than left. No significant mass effect or midline shift.
3. 7 mm acute intraparenchymal hemorrhage positioned at the ventral
right thalamus, with a few additional suspected punctate
intraparenchymal hemorrhages about the left thalamus, midbrain, and
pons. No associated edema or visible underlying structural
abnormality. Location is most characteristic of hypertensive bleeds.
4. No other acute intracranial abnormality.

MRA HEAD IMPRESSION:

Normal intracranial MRA. No aneurysm, AVM, or other vascular
abnormality.

## 2021-03-06 IMAGING — MR MR MRA HEAD W/O CM
1 series · 17 of 48 positions shown · non-contrast
Comparison: Prior CT from [DATE] as well as previous studies.

CLINICAL DATA: Follow-up examination for intracranial hemorrhage.

EXAM:
MRI HEAD WITHOUT CONTRAST
MRA HEAD WITHOUT CONTRAST
TECHNIQUE: Multiplanar, multi-echo pulse sequences of the brain and surrounding
structures were acquired without intravenous contrast. Angiographic
images of the Circle of Willis were acquired using MRA technique
without intravenous contrast.

[Series 5: 3d cow · axial · 0.5mm · 0.43mm/px · z∈[-75,+12]mm · 17 of 188 slices shown]
[im 1/188]
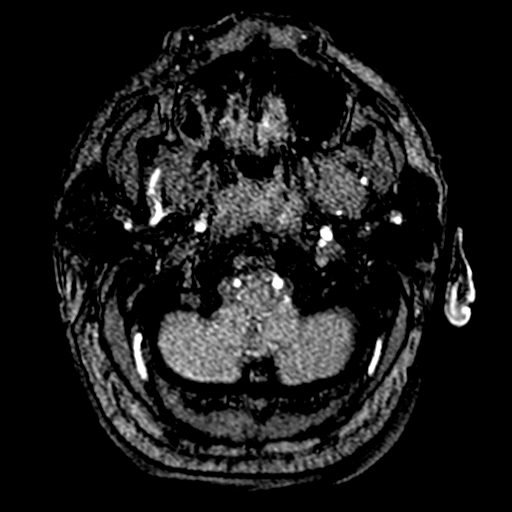
[im 4/188]
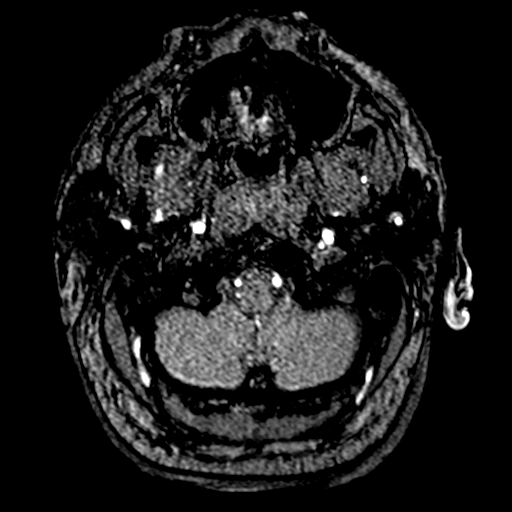
[im 8/188]
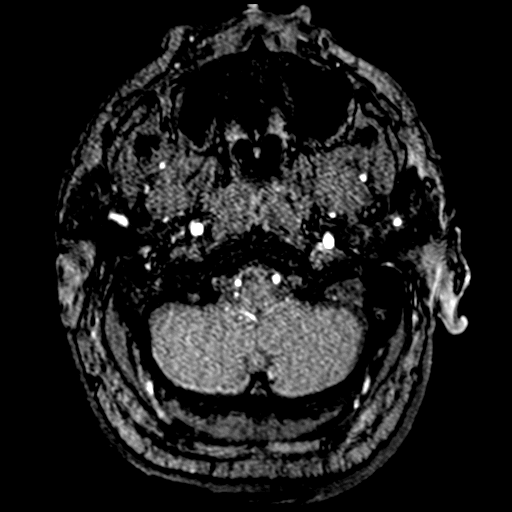
[im 12/188]
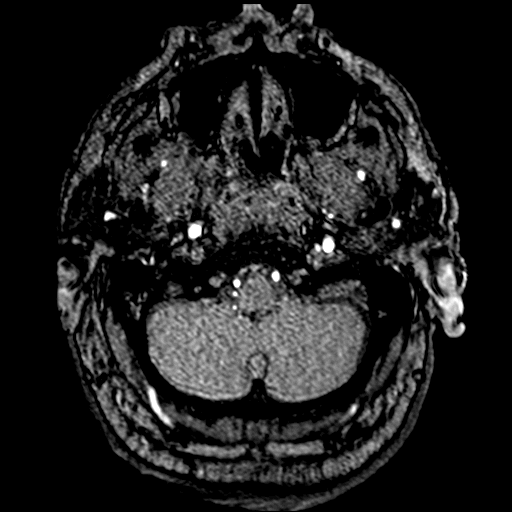
[im 16/188]
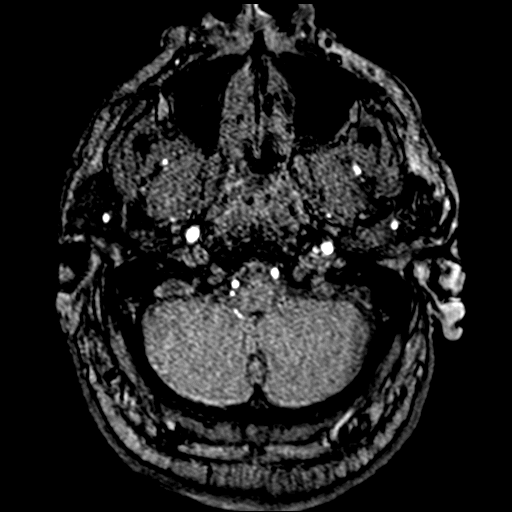
[im 20/188]
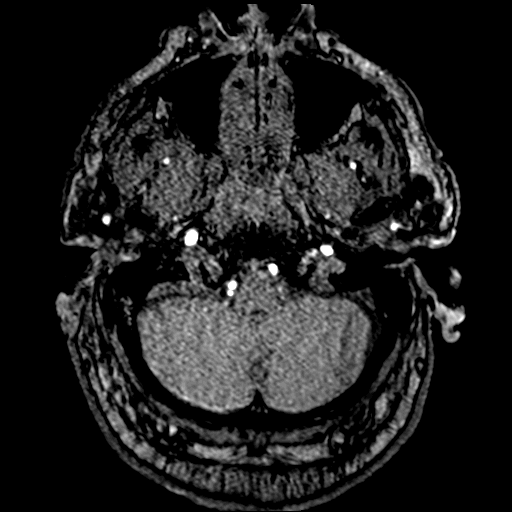
[im 24/188]
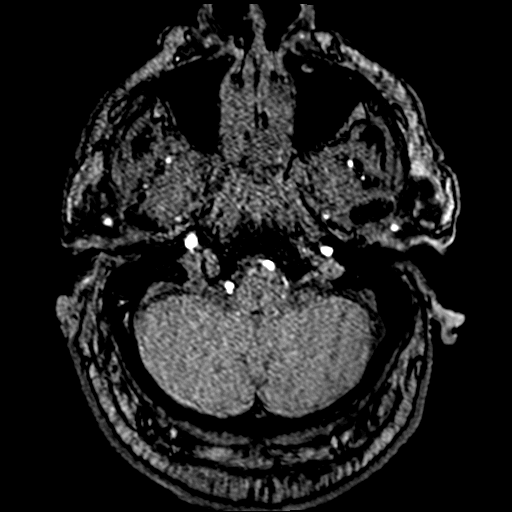
[im 32/188]
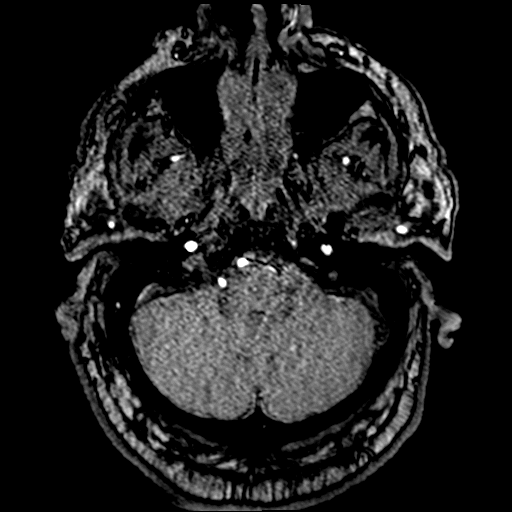
[im 36/188]
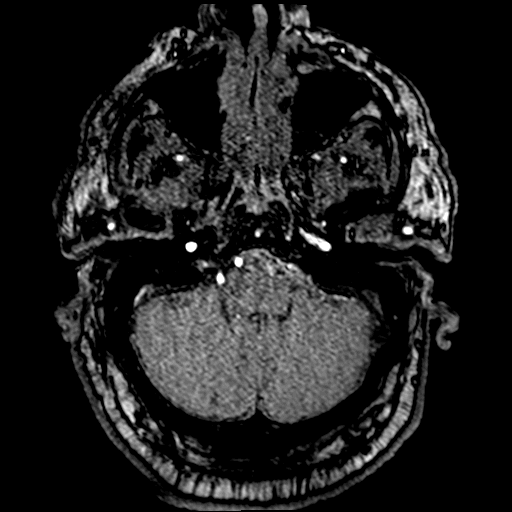
[im 60/188]
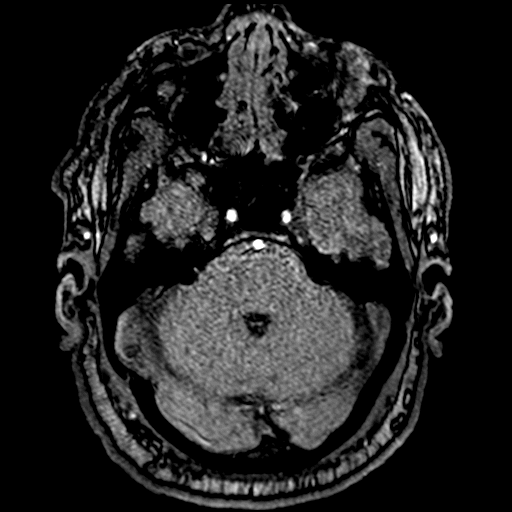
[im 84/188]
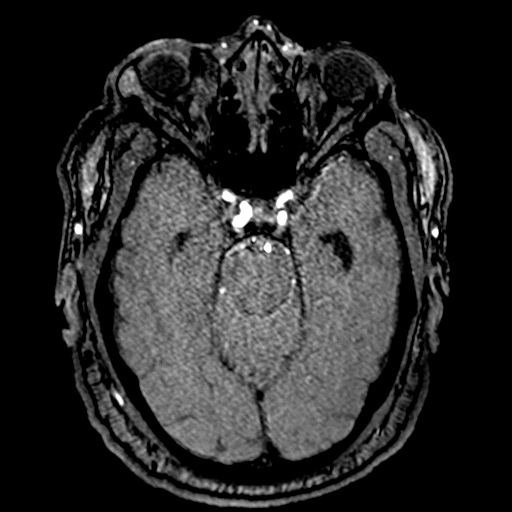
[im 96/188]
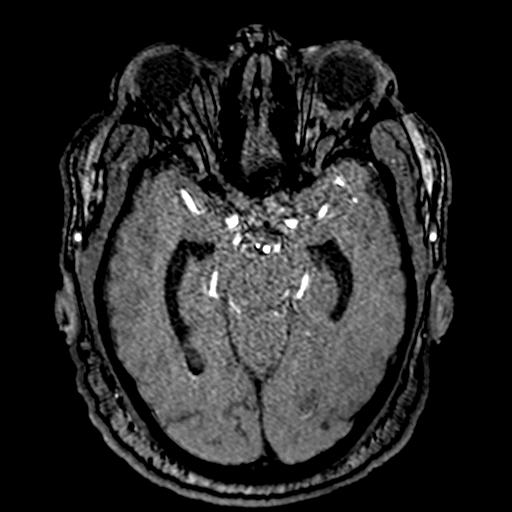
[im 108/188]
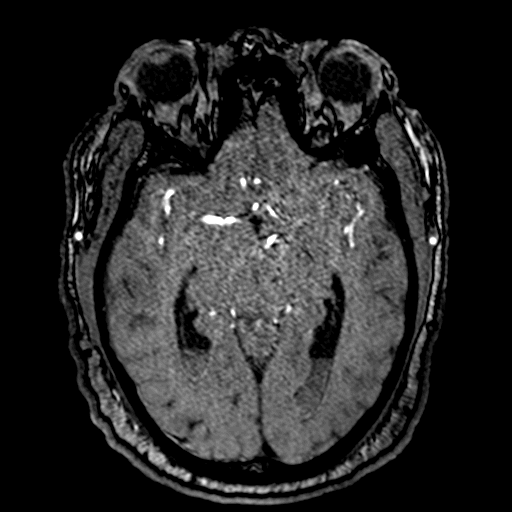
[im 132/188]
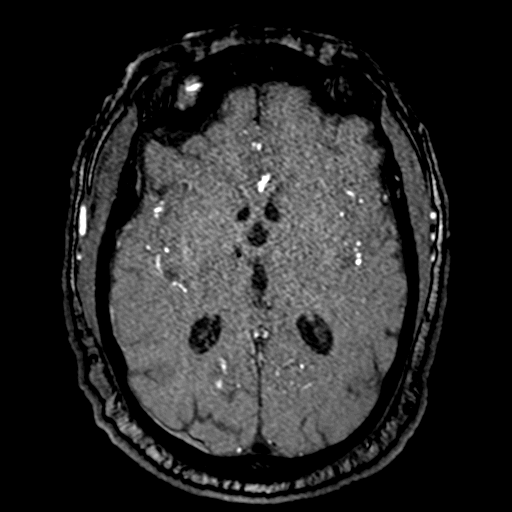
[im 156/188]
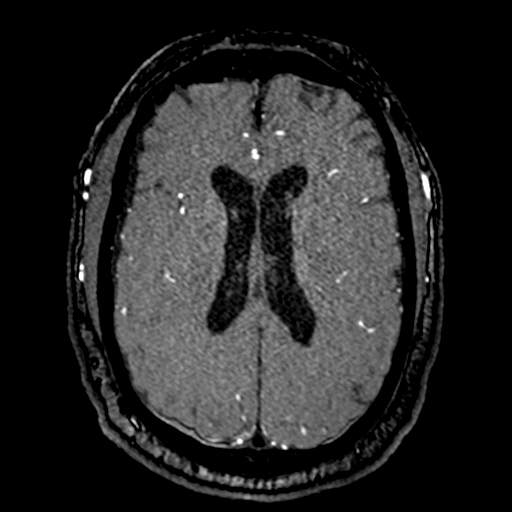
[im 160/188]
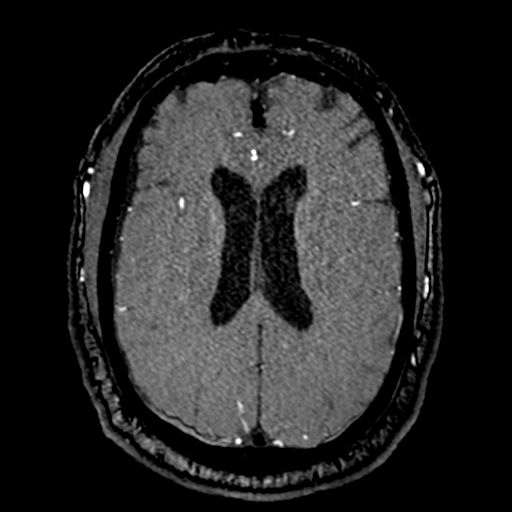
[im 180/188]
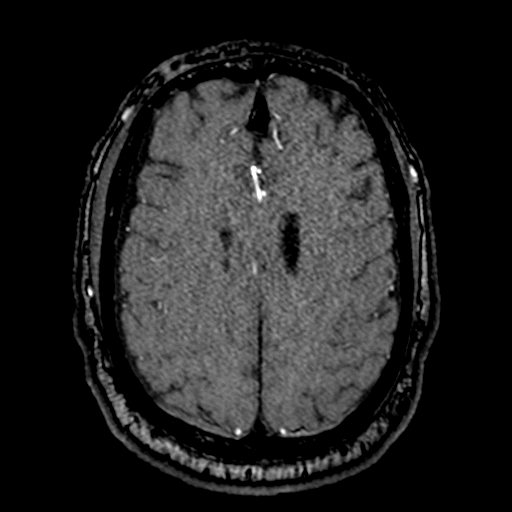

[17 of 48 positions shown; findings below may reference images not displayed]

FINDINGS: MRI HEAD FINDINGS

Brain: Again seen is large volume subarachnoid hemorrhage scattered
throughout the brain, but most pronounced at the basilar cisterns.
Overall appearance and volume is relatively similar to previous CT.
Intraventricular extension with blood layering within the occipital
horns of both lateral ventricles, third ventricle, and fourth
ventricle, consistent with redistribution. Ventricular size is
stable from prior without worsened hydrocephalus. No transependymal
flow of CSF. Extra-axial extension with small subdural collections
overlying the bilateral cerebral convexities, measuring up to 3-4 mm
bilaterally, right slightly larger than left. No significant mass
effect or midline shift. There is a suspected 7 mm acute
intraparenchymal hemorrhage centered at the ventral right thalamus
(series 18, image 34), also seen on prior CT in retrospect. No
significant surrounding edema or other underlying abnormality.
Location is most characteristic of a hypertensive bleed. No other
convincing intraparenchymal hemorrhage is seen, although a few
additional scattered foci of susceptibility artifact noted about the
left thalamus, midbrain, and pons.

No evidence for acute or subacute infarct. Gray-white matter
differentiation maintained. No encephalomalacia to suggest chronic
cortical infarction. No mass lesion. Pituitary gland suprasellar
region grossly normal. Midline structures intact and normal.

Vascular: Major intracranial vascular flow voids are maintained.

Skull and upper cervical spine: Craniocervical junction within
normal limits. Bone marrow signal intensity normal. No scalp soft
tissue abnormality.

Sinuses/Orbits: Prior ocular lens replacement noted on the left.
Globes and orbital soft tissues demonstrate no acute finding.
Scattered mucosal thickening noted throughout the paranasal sinuses.
Patient is intubated. Mastoid air cells remain largely clear.

Other: None.

MRA HEAD FINDINGS

Anterior circulation: Visualized distal cervical segments of the
internal carotid arteries are patent with antegrade flow. Petrous,
cavernous, and supraclinoid segments patent without stenosis or
other abnormality. A1 segments patent bilaterally. Normal anterior
communicating artery complex. Anterior cerebral arteries widely
patent to their distal aspects without stenosis. No M1 stenosis or
occlusion. Normal MCA bifurcations. Distal MCA branches well
perfused and symmetric.

Posterior circulation: Both V4 segments patent to the
vertebrobasilar junction without stenosis. Left vertebral artery
dominant. Both PICA origins patent and normal. Basilar patent to its
distal aspect without stenosis. Superior cerebellar arteries patent
bilaterally. Both PCAs primarily supplied via the basilar well
perfused to their distal aspects.

Anatomic variants: None significant. No intracranial aneurysm, AVM,
or other vascular abnormality.
IMPRESSION: MRI HEAD IMPRESSION:

1. Large volume subarachnoid hemorrhage scattered throughout the
brain, most pronounced at the basilar cisterns. Intraventricular
extension consistent with redistribution. Ventricular size is stable
from previous without worsened hydrocephalus.
2. Extra-axial extension with small volume subdural collections
overlying the bilateral cerebral convexities, right slightly larger
than left. No significant mass effect or midline shift.
3. 7 mm acute intraparenchymal hemorrhage positioned at the ventral
right thalamus, with a few additional suspected punctate
intraparenchymal hemorrhages about the left thalamus, midbrain, and
pons. No associated edema or visible underlying structural
abnormality. Location is most characteristic of hypertensive bleeds.
4. No other acute intracranial abnormality.

MRA HEAD IMPRESSION:

Normal intracranial MRA. No aneurysm, AVM, or other vascular
abnormality.

## 2021-03-06 MED ORDER — PERFLUTREN LIPID MICROSPHERE
1.0000 mL | INTRAVENOUS | Status: AC | PRN
  Administered 2021-03-06: 3 mL via INTRAVENOUS
  Filled 2021-03-06: qty 10

## 2021-03-06 MED ORDER — AMLODIPINE BESYLATE 10 MG PO TABS
10.0000 mg | ORAL_TABLET | Freq: Every day | ORAL | Status: DC
  Administered 2021-03-06 – 2021-03-13 (×8): 10 mg
  Filled 2021-03-06 (×8): qty 1

## 2021-03-06 MED ORDER — NIMODIPINE 6 MG/ML PO SOLN
60.0000 mg | ORAL | Status: DC
Start: 2021-03-06 — End: 2021-03-14
  Administered 2021-03-06 – 2021-03-13 (×36): 60 mg
  Filled 2021-03-06 (×35): qty 10

## 2021-03-06 MED ORDER — PROSOURCE TF PO LIQD
90.0000 mL | Freq: Three times a day (TID) | ORAL | Status: DC
  Administered 2021-03-06 – 2021-03-09 (×7): 90 mL
  Filled 2021-03-06 (×6): qty 90

## 2021-03-06 MED ORDER — VITAL 1.5 CAL PO LIQD
1000.0000 mL | ORAL | Status: AC
  Administered 2021-03-06 – 2021-03-09 (×3): 1000 mL

## 2021-03-06 MED ORDER — SODIUM CHLORIDE 0.9 % IV SOLN
INTRAVENOUS | Status: DC | PRN
  Administered 2021-03-10 (×3): 250 mL via INTRAVENOUS

## 2021-03-06 MED ORDER — CLONIDINE HCL 0.2 MG PO TABS
0.2000 mg | ORAL_TABLET | Freq: Three times a day (TID) | ORAL | Status: DC
  Administered 2021-03-06 (×3): 0.2 mg
  Filled 2021-03-06 (×3): qty 1

## 2021-03-06 NOTE — Progress Notes (Signed)
Initial Nutrition Assessment  DOCUMENTATION CODES:   Obesity unspecified  INTERVENTION:   Initiate tube feeding via OG tube: Vital 1.5 at 55 ml/h (1320 ml per day) Prosource TF 90 ml TID  Provides 1824 kcal, 165 gm protein, 1001 ml free water daily  Cleviprex adding additional kcal from lipid   NUTRITION DIAGNOSIS:   Inadequate oral intake related to inability to eat as evidenced by NPO status.  GOAL:   Patient will meet greater than or equal to 90% of their needs  MONITOR:   TF tolerance, Vent status  REASON FOR ASSESSMENT:   Consult, Ventilator Enteral/tube feeding initiation and management  ASSESSMENT:   Pt with PMH of DM and HTN admitted with extensive SAH with IVH per MD likely due to chronic HTN.   CCM ok with starting feeding.   Patient is currently intubated on ventilator support MV: 8.9 L/min Temp (24hrs), Avg:99.3 F (37.4 C), Min:98.6 F (37 C), Max:99.8 F (37.7 C)   Medications reviewed and include: colace, SSI, protonix, miralax Fentanyl Keppra Cleviprex @ 38 ml/hr provides: 1824 kcal from lipid   Labs reviewed: Na 150 CBG: 163   OG: tip distal stomach   NUTRITION - FOCUSED PHYSICAL EXAM:  Flowsheet Row Most Recent Value  Orbital Region No depletion  Upper Arm Region No depletion  Thoracic and Lumbar Region No depletion  Buccal Region Unable to assess  Temple Region No depletion  Clavicle Bone Region No depletion  Clavicle and Acromion Bone Region No depletion  Scapular Bone Region No depletion  Dorsal Hand No depletion  Patellar Region No depletion  Anterior Thigh Region No depletion  Posterior Calf Region No depletion  Edema (RD Assessment) Mild  Hair Reviewed  Eyes Unable to assess  Mouth Unable to assess  Skin Reviewed  Nails Reviewed       Diet Order:   Diet Order             Diet NPO time specified  Diet effective now                   EDUCATION NEEDS:   Not appropriate for education at this  time  Skin:  Skin Assessment: Reviewed RN Assessment  Last BM:  unknown  Height:   Ht Readings from Last 1 Encounters:  04/04/2021 5\' 10"  (1.778 m)    Weight:   Wt Readings from Last 1 Encounters:  April 04, 2021 130.4 kg    Ideal Body Weight:     BMI:  Body mass index is 41.25 kg/m.  Estimated Nutritional Needs:   Kcal:  1800  Protein:  >150 grams  Fluid:  > 1.8 L/day  03/07/21., RD, LDN, CNSC See AMiON for contact information

## 2021-03-06 NOTE — Progress Notes (Signed)
OT Cancellation Note  Patient Details Name: Connor [name] MRN: [id number] DOB: 1945/09/24   Cancelled Treatment:    Reason Eval/Treat Not Completed: Active bedrest order OT order received and appreciated however this conflicts with current bedrest order set. Please increase activity tolerance as appropriate and remove bedrest from orders. . Please contact OT at [phone] if bed rest order is discontinued. OT will hold evaluation at this time and will check back as time allows pending increased activity orders.   Wynona Neat, OTR/L  Acute Rehabilitation Services Pager: [phone] Office: [phone] .  03/06/2021, 7:28 AM

## 2021-03-06 NOTE — Progress Notes (Signed)
Patient transported to MRI and back to 4N22 without incidence.

## 2021-03-06 NOTE — Progress Notes (Signed)
eLink Physician-Brief Progress Note Patient Name: Connor [name] DOB: March 23, 1946 MRN: [id number]   Date of Service  03/06/2021  HPI/Events of Note  Pt developing nonoliguric AKI in setting of a HTN'ive crisis.    KCl a bit elevated as well; although this is in the setting of getting KCl replacement yesterday.  eICU Interventions  No further KCl products are ordered.  AKI likely intra-renal  - avoid nephrotoxic meds if possible - dose all other meds based on Crt clearance and GFR - maintain an safe MAP     Intervention Category Minor Interventions: Electrolytes abnormality - evaluation and management  Jacinta Shoe 03/06/2021, 4:18 AM

## 2021-03-06 NOTE — Progress Notes (Signed)
SLP Cancellation Note  Patient Details Name: Connor [name] MRN: [id number] DOB: 1945-11-28   Cancelled treatment:        Reason eval/treat not completed: Pt not medically ready; orally intubated. SLP will follow for pt readiness.   Jeannie Done, SLP-Student    Jeannie Done 03/06/2021, 8:39 AM

## 2021-03-06 NOTE — Progress Notes (Signed)
NAMEDeundre [name], MRN:  [id number], DOB:  January 07, 1946, LOS: 1 ADMISSION DATE:  03/07/2021, CONSULTATION DATE:  02/22/2021 REFERRING MD:  Thomasena Edis CHIEF COMPLAINT:  AMS   History of Present Illness:  Connor [name] is a 75 y.o. male who has a PMH as outlined below who presented to [facility] ED 9/19 as code stroke.  He woke up his normal self that morning but then had acute onset of severe headache. He called EMS and upon their arrival, he had AMS and left facial droop.  SBP was noted to be 270.  He was brought to ED where CT head demonstrated SAH.  He was intubated for airway protection and was started on Cleviprex for BP control with goal SBP 120 - 140.  Neurology has admitted him to neuro ICU and PCCM asked to assist with vent management.  He is currently on Propofol and Fentanyl for sedation.  Pertinent  Medical History:  has Diabetes (HCC) and ICH (intracerebral hemorrhage) (HCC) on their problem list.  Significant Hospital Events: Including procedures, antibiotic start and stop dates in addition to other pertinent events   9/19 > admit.  Interim History / Subjective:  Sedated on Fentanyl but is moving extremities.  Objective:  Blood pressure (!) 121/53, pulse 62, temperature 99.4 F (37.4 C), temperature source Axillary, resp. rate 18, height 5\' 10"  (1.778 m), weight 130.4 kg, SpO2 93 %.    Vent Mode: PRVC FiO2 (%):  [40 %-100 %] 40 % Set Rate:  [18 bmp] 18 bmp Vt Set:  [560 mL-570 mL] 570 mL PEEP:  [5 cmH20] 5 cmH20 Plateau Pressure:  [15 cmH20-17 cmH20] 17 cmH20   Intake/Output Summary (Last 24 hours) at 03/06/2021 0841 Last data filed at 03/06/2021 0700 Gross per 24 hour  Intake 3677.74 ml  Output 1470 ml  Net 2207.74 ml    Filed Weights   02/22/2021 0821 03/12/2021 0856 03/12/2021 1145  Weight: 132 kg 132.9 kg 130.4 kg    Examination: General: Adult male, resting in bed, in NAD. Neuro: Sedated, intermittently following some basic commands. HEENT: [place]/AT. Sclerae anicteric.  ETT in place. Cardiovascular: RRR, no M/R/G.  Lungs: Respirations even and unlabored.  CTA bilaterally, No W/R/R. Abdomen: Obese, BS x 4, soft, NT/ND.  Musculoskeletal: No gross deformities, no edema.  Skin: Intact, warm, no rashes.  Labs/imaging personally reviewed:  CTH / CTAH 9/19 > large SAH, no acute infarct or aneurysm. MRI brain 9/20 > large SAH, IV extension with redistribution, stable ventricular size without worsened hydrocephalus, 17mm acute IPH at right thalamus. Echo 9/20 >   Resolved Hospital Problem list:    Assessment & Plan:   Large SAH presumed hypertensive.  No surgical intervention per NSGY. - Management per neuro. - Strict BP control with Clevidipine PRN for goal SBP 120 - 140. - Continue 3% NS with frequent Na checks for goal Na 145 - 155. - Continue empiric Keppra, Nimodipine for now. - Consider repeat CTA in a few days.  Hx HTN now with hypertensive emergency. - Strict BP goal as above to avoid worsened or recurrent head bleed. - Hold home ASA, Atorvastatin, Hydralazine, Losartan, Metoprolol.  Respiratory insufficiency - due to inability to protect the airway in the setting of above. - Full vent support. - Assess ABG. - Wean as able as mental status allows, currently not responsive enough for extubation. - Daily SBT. - Bronchial hygiene. - Follow CXR.  Mild hyperkalemia. AKI. - 1 dose Lokelma now. - Repeat BMP STAT.  Hx DM. -  SSI. - Hold home [place], Novolog, Lantus, Metformin.  Best practice (evaluated daily):  Diet/type: NPO DVT prophylaxis: SCD GI prophylaxis: PPI Lines: N/A Foley:  Yes, and it is still needed Code Status:  full code Last date of multidisciplinary goals of care discussion: Per primary.   Critical care time: 30 min.   Rutherford Guys, PA - [facility] For pager details, please see AMION or use Epic chat  After 1900, please call [facility] for cross coverage needs 03/06/2021, 8:41 AM

## 2021-03-06 NOTE — Progress Notes (Signed)
STROKE TEAM PROGRESS NOTE   SUBJECTIVE (INTERVAL HISTORY) His brother and nephew are at the bedside.  Overall his condition is stable. Pt was drowsy sleepy but able to open eyes and follow simple commands in all extremities.  Moving all extremities, pupil equal size.  On nimodipine and Cleviprex.  We will do TCD monitoring.   OBJECTIVE Temp:  [97.8 F (36.6 C)-99.4 F (37.4 C)] 99.4 F (37.4 C) (09/20 0743) Pulse Rate:  [59-73] 63 (09/20 1000) Cardiac Rhythm: Normal sinus rhythm (09/20 0800) Resp:  [13-19] 17 (09/20 1000) BP: (104-157)/(50-65) 152/62 (09/20 1000) SpO2:  [90 %-100 %] 94 % (09/20 1000) FiO2 (%):  [40 %] 40 % (09/20 0800)  Recent Labs  Lab 03/16/2021 1549 02/18/2021 1932 03/07/2021 2315 03/06/21 0450 03/06/21 0746  GLUCAP 214* 163* 176* 168* 171*   Recent Labs  Lab 03/10/2021 0810 03/09/2021 0817 02/16/2021 1002 02/24/2021 1129 03/16/2021 1429 02/18/2021 1932 03/06/21 0152 03/06/21 0819 03/06/21 1032  NA 137 143 143 140 141 143 144 146* 149*  K 2.8* 2.7* 3.5 3.6  --   --  5.8*  --  3.8  CL 103 104  --  105  --   --  116*  --  120*  CO2 21*  --   --  23  --   --  20*  --  22  GLUCOSE 239* 235*  --  247*  --   --  184*  --  163*  BUN 11 12  --  11  --   --  15  --  15  CREATININE 1.39* 1.20  --  1.34*  --   --  1.48*  --  1.52*  CALCIUM 9.3  --   --  9.4  --   --  8.8*  --  8.9  MG  --   --   --  1.9  --   --   --   --   --   PHOS  --   --   --  1.9*  --   --   --   --   --    Recent Labs  Lab 02/27/2021 0810 03/06/21 0152  AST 30 57*  ALT 9 <5  ALKPHOS 52 47  BILITOT 1.0 1.7*  PROT 6.5 5.2*  ALBUMIN 3.6 3.1*   Recent Labs  Lab 02/23/2021 0810 02/17/2021 0817 03/01/2021 1002 03/06/21 0152  WBC 9.8  --   --  12.0*  NEUTROABS 4.5  --   --   --   HGB 17.3* 18.4* 17.3* 16.8  HCT 54.7* 54.0* 51.0 50.7  MCV 100.2*  --   --  99.0  PLT 195  --   --  147*   No results for input(s): CKTOTAL, CKMB, CKMBINDEX, TROPONINI in the last 168 hours. Recent Labs     03/15/2021 0810  LABPROT 13.4  INR 1.0   No results for input(s): COLORURINE, LABSPEC, PHURINE, GLUCOSEU, HGBUR, BILIRUBINUR, KETONESUR, PROTEINUR, UROBILINOGEN, NITRITE, LEUKOCYTESUR in the last 72 hours.  Invalid input(s): APPERANCEUR     Component Value Date/Time   CHOL 85 03/06/2021 0819   TRIG 167 (H) 03/06/2021 0819   HDL 28 (L) 03/06/2021 0819   CHOLHDL 3.0 03/06/2021 0819   VLDL 33 03/06/2021 0819   LDLCALC 24 03/06/2021 0819   Lab Results  Component Value Date   HGBA1C 7.2 (H) 02/26/2021      Component Value Date/Time   LABOPIA NONE DETECTED 02/21/2021 2254   COCAINSCRNUR NONE DETECTED  03/12/2021 2254   LABBENZ NONE DETECTED 02/18/2021 2254   AMPHETMU NONE DETECTED 03/03/2021 2254   THCU NONE DETECTED 03/04/2021 2254   LABBARB NONE DETECTED 03/07/2021 2254    Recent Labs  Lab 03/12/2021 1129  ETH <10    I have personally reviewed the radiological images below and agree with the radiology interpretations.  CT HEAD WO CONTRAST  Result Date: 03/16/2021 CLINICAL DATA:  Headache.  Follow-up intracranial hemorrhage. EXAM: CT HEAD WITHOUT CONTRAST TECHNIQUE: Contiguous axial images were obtained from the base of the skull through the vertex without intravenous contrast. COMPARISON:  CT studies earlier same day. FINDINGS: Brain: Subarachnoid hemorrhage at the base of the brain appears similar to the previous examination. There is more blood that has made its way into the ventricular system, layering in the occipital horns. No evidence of development of parenchymal hemorrhage. No evidence of infarction or change in ventricular size. Vascular: No other vascular finding. Skull: Negative Sinuses/Orbits: Clear/normal Other: None IMPRESSION: Similar appearance of subarachnoid hemorrhage primarily at the base of the brain. Increased amount of blood layering dependently in the occipital horns of the lateral ventricles. No intraparenchymal hemorrhage. No change in ventricular size.  Electronically Signed   By: Paulina Fusi M.D.   On: 03/04/2021 15:42   MR ANGIO HEAD WO CONTRAST  Result Date: 03/06/2021 CLINICAL DATA:  Follow-up examination for intracranial hemorrhage. EXAM: MRI HEAD WITHOUT CONTRAST MRA HEAD WITHOUT CONTRAST TECHNIQUE: Multiplanar, multi-echo pulse sequences of the brain and surrounding structures were acquired without intravenous contrast. Angiographic images of the Circle of Willis were acquired using MRA technique without intravenous contrast. COMPARISON:  Prior CT from 02/28/2021 as well as previous studies. FINDINGS: MRI HEAD FINDINGS Brain: Again seen is large volume subarachnoid hemorrhage scattered throughout the brain, but most pronounced at the basilar cisterns. Overall appearance and volume is relatively similar to previous CT. Intraventricular extension with blood layering within the occipital horns of both lateral ventricles, third ventricle, and fourth ventricle, consistent with redistribution. Ventricular size is stable from prior without worsened hydrocephalus. No transependymal flow of CSF. Extra-axial extension with small subdural collections overlying the bilateral cerebral convexities, measuring up to 3-4 mm bilaterally, right slightly larger than left. No significant mass effect or midline shift. There is a suspected 7 mm acute intraparenchymal hemorrhage centered at the ventral right thalamus (series 18, image 34), also seen on prior CT in retrospect. No significant surrounding edema or other underlying abnormality. Location is most characteristic of a hypertensive bleed. No other convincing intraparenchymal hemorrhage is seen, although a few additional scattered foci of susceptibility artifact noted about the left thalamus, midbrain, and pons. No evidence for acute or subacute infarct. Gray-white matter differentiation maintained. No encephalomalacia to suggest chronic cortical infarction. No mass lesion. Pituitary gland suprasellar region grossly  normal. Midline structures intact and normal. Vascular: Major intracranial vascular flow voids are maintained. Skull and upper cervical spine: Craniocervical junction within normal limits. Bone marrow signal intensity normal. No scalp soft tissue abnormality. Sinuses/Orbits: Prior ocular lens replacement noted on the left. Globes and orbital soft tissues demonstrate no acute finding. Scattered mucosal thickening noted throughout the paranasal sinuses. Patient is intubated. Mastoid air cells remain largely clear. Other: None. MRA HEAD FINDINGS Anterior circulation: Visualized distal cervical segments of the internal carotid arteries are patent with antegrade flow. Petrous, cavernous, and supraclinoid segments patent without stenosis or other abnormality. A1 segments patent bilaterally. Normal anterior communicating artery complex. Anterior cerebral arteries widely patent to their distal aspects without stenosis. No M1  stenosis or occlusion. Normal MCA bifurcations. Distal MCA branches well perfused and symmetric. Posterior circulation: Both V4 segments patent to the vertebrobasilar junction without stenosis. Left vertebral artery dominant. Both PICA origins patent and normal. Basilar patent to its distal aspect without stenosis. Superior cerebellar arteries patent bilaterally. Both PCAs primarily supplied via the basilar well perfused to their distal aspects. Anatomic variants: None significant. No intracranial aneurysm, AVM, or other vascular abnormality. IMPRESSION: MRI HEAD IMPRESSION: 1. Large volume subarachnoid hemorrhage scattered throughout the brain, most pronounced at the basilar cisterns. Intraventricular extension consistent with redistribution. Ventricular size is stable from previous without worsened hydrocephalus. 2. Extra-axial extension with small volume subdural collections overlying the bilateral cerebral convexities, right slightly larger than left. No significant mass effect or midline shift. 3.  7 mm acute intraparenchymal hemorrhage positioned at the ventral right thalamus, with a few additional suspected punctate intraparenchymal hemorrhages about the left thalamus, midbrain, and pons. No associated edema or visible underlying structural abnormality. Location is most characteristic of hypertensive bleeds. 4. No other acute intracranial abnormality. MRA HEAD IMPRESSION: Normal intracranial MRA. No aneurysm, AVM, or other vascular abnormality. Electronically Signed   By: Rise Mu M.D.   On: 03/06/2021 05:34   MR BRAIN WO CONTRAST  Result Date: 03/06/2021 CLINICAL DATA:  Follow-up examination for intracranial hemorrhage. EXAM: MRI HEAD WITHOUT CONTRAST MRA HEAD WITHOUT CONTRAST TECHNIQUE: Multiplanar, multi-echo pulse sequences of the brain and surrounding structures were acquired without intravenous contrast. Angiographic images of the Circle of Willis were acquired using MRA technique without intravenous contrast. COMPARISON:  Prior CT from 02/25/2021 as well as previous studies. FINDINGS: MRI HEAD FINDINGS Brain: Again seen is large volume subarachnoid hemorrhage scattered throughout the brain, but most pronounced at the basilar cisterns. Overall appearance and volume is relatively similar to previous CT. Intraventricular extension with blood layering within the occipital horns of both lateral ventricles, third ventricle, and fourth ventricle, consistent with redistribution. Ventricular size is stable from prior without worsened hydrocephalus. No transependymal flow of CSF. Extra-axial extension with small subdural collections overlying the bilateral cerebral convexities, measuring up to 3-4 mm bilaterally, right slightly larger than left. No significant mass effect or midline shift. There is a suspected 7 mm acute intraparenchymal hemorrhage centered at the ventral right thalamus (series 18, image 34), also seen on prior CT in retrospect. No significant surrounding edema or other  underlying abnormality. Location is most characteristic of a hypertensive bleed. No other convincing intraparenchymal hemorrhage is seen, although a few additional scattered foci of susceptibility artifact noted about the left thalamus, midbrain, and pons. No evidence for acute or subacute infarct. Gray-white matter differentiation maintained. No encephalomalacia to suggest chronic cortical infarction. No mass lesion. Pituitary gland suprasellar region grossly normal. Midline structures intact and normal. Vascular: Major intracranial vascular flow voids are maintained. Skull and upper cervical spine: Craniocervical junction within normal limits. Bone marrow signal intensity normal. No scalp soft tissue abnormality. Sinuses/Orbits: Prior ocular lens replacement noted on the left. Globes and orbital soft tissues demonstrate no acute finding. Scattered mucosal thickening noted throughout the paranasal sinuses. Patient is intubated. Mastoid air cells remain largely clear. Other: None. MRA HEAD FINDINGS Anterior circulation: Visualized distal cervical segments of the internal carotid arteries are patent with antegrade flow. Petrous, cavernous, and supraclinoid segments patent without stenosis or other abnormality. A1 segments patent bilaterally. Normal anterior communicating artery complex. Anterior cerebral arteries widely patent to their distal aspects without stenosis. No M1 stenosis or occlusion. Normal MCA bifurcations. Distal MCA branches well perfused  and symmetric. Posterior circulation: Both V4 segments patent to the vertebrobasilar junction without stenosis. Left vertebral artery dominant. Both PICA origins patent and normal. Basilar patent to its distal aspect without stenosis. Superior cerebellar arteries patent bilaterally. Both PCAs primarily supplied via the basilar well perfused to their distal aspects. Anatomic variants: None significant. No intracranial aneurysm, AVM, or other vascular abnormality.  IMPRESSION: MRI HEAD IMPRESSION: 1. Large volume subarachnoid hemorrhage scattered throughout the brain, most pronounced at the basilar cisterns. Intraventricular extension consistent with redistribution. Ventricular size is stable from previous without worsened hydrocephalus. 2. Extra-axial extension with small volume subdural collections overlying the bilateral cerebral convexities, right slightly larger than left. No significant mass effect or midline shift. 3. 7 mm acute intraparenchymal hemorrhage positioned at the ventral right thalamus, with a few additional suspected punctate intraparenchymal hemorrhages about the left thalamus, midbrain, and pons. No associated edema or visible underlying structural abnormality. Location is most characteristic of hypertensive bleeds. 4. No other acute intracranial abnormality. MRA HEAD IMPRESSION: Normal intracranial MRA. No aneurysm, AVM, or other vascular abnormality. Electronically Signed   By: Rise Mu M.D.   On: 03/06/2021 05:34   DG Chest Portable 1 View  Result Date: 03/01/2021 CLINICAL DATA:  Intubated EXAM: PORTABLE CHEST 1 VIEW COMPARISON:  None FINDINGS: Endotracheal tube is 3 cm above the carina. Possible enteric tube coiled in the pharynx. Low lung volumes with crowding of markings. No consolidation or edema. No pleural effusion. Normal heart size. IMPRESSION: Endotracheal tube in place. Possible enteric tube coiled in the pharynx. No acute abnormality. Electronically Signed   By: Guadlupe Spanish M.D.   On: 03/12/2021 08:51   DG Abd Portable 1V  Result Date: 03/04/2021 CLINICAL DATA:  Orogastric tube placement. EXAM: PORTABLE ABDOMEN - 1 VIEW COMPARISON:  None. FINDINGS: The bowel gas pattern is normal. Distal tip of nasogastric tube is seen in expected position of distal stomach. No radio-opaque calculi or other significant radiographic abnormality are seen. IMPRESSION: Distal tip of nasogastric tube is seen in expected position of distal  stomach. Electronically Signed   By: Lupita Raider M.D.   On: 03/07/2021 14:30   ECHOCARDIOGRAM COMPLETE  Result Date: 03/06/2021    ECHOCARDIOGRAM REPORT   Patient Name:   JOZSEF BROUSE Date of Exam: 03/06/2021 Medical Rec #:  [id number]      Height:       70.0 in Accession #:    [id number]     Weight:       287.5 lb Date of Birth:  [birth year]      BSA:          2.435 m Patient Age:    75 years       BP:           121/53 mmHg Patient Gender: M              HR:           63 bpm. Exam Location:  Inpatient Procedure: 2D Echo, Cardiac Doppler, Color Doppler and Intracardiac            Opacification Agent Indications:    Stroke  History:        Patient has no prior history of Echocardiogram examinations.                 Risk Factors:Hypertension, Diabetes and Former Smoker.  Sonographer:    Ross Ludwig RDCS (AE) Referring Phys: [id number] [facility]  Sonographer Comments: No subcostal window, Technically difficult study  due to poor echo windows, patient is morbidly obese and echo performed with patient supine and on artificial respirator. Image acquisition challenging due to patient body habitus. IMPRESSIONS  1. There is a dynamic mid cavitary LV gradient of due to hyperdynamic LVF.Marland Kitchen Left ventricular ejection fraction, by estimation, is 70 to 75%. The left ventricle has hyperdynamic function. The left ventricle has no regional wall motion abnormalities. There is severe asymmetric left ventricular hypertrophy of the septal segment. Left ventricular diastolic parameters are consistent with Grade I diastolic dysfunction (impaired relaxation).  2. Right ventricular systolic function is mildly reduced. The right ventricular size is not well visualized.  3. The mitral valve is normal in structure. No evidence of mitral valve regurgitation. No evidence of mitral stenosis.  4. The aortic valve is normal in structure. Aortic valve regurgitation is not visualized. No aortic stenosis is present.  5. The inferior  vena cava is normal in size with greater than 50% respiratory variability, suggesting right atrial pressure of 3 mmHg. FINDINGS  Left Ventricle: There is a dynamic mid cavitary LV gradient of due to hyperdynamic LVF. Left ventricular ejection fraction, by estimation, is 70 to 75%. The left ventricle has hyperdynamic function. The left ventricle has no regional wall motion abnormalities. Definity contrast agent was given IV to delineate the left ventricular endocardial borders. The left ventricular internal cavity size was normal in size. There is severe asymmetric left ventricular hypertrophy of the septal segment. Left ventricular diastolic parameters are consistent with Grade I diastolic dysfunction (impaired relaxation). Normal left ventricular filling pressure. Right Ventricle: The right ventricular size is not well visualized. Right vetricular wall thickness was not assessed. Right ventricular systolic function is mildly reduced. Left Atrium: Left atrial size was normal in size. Right Atrium: Right atrial size was normal in size. Pericardium: There is no evidence of pericardial effusion. Mitral Valve: The mitral valve is normal in structure. No evidence of mitral valve regurgitation. No evidence of mitral valve stenosis. Tricuspid Valve: The tricuspid valve is normal in structure. Tricuspid valve regurgitation is trivial. No evidence of tricuspid stenosis. Aortic Valve: The aortic valve is normal in structure. Aortic valve regurgitation is not visualized. No aortic stenosis is present. Aortic valve mean gradient measures 9.0 mmHg. Aortic valve peak gradient measures 15.4 mmHg. Aortic valve area, by VTI measures 4.40 cm. Pulmonic Valve: The pulmonic valve was normal in structure. Pulmonic valve regurgitation is not visualized. No evidence of pulmonic stenosis. Aorta: The aortic root is normal in size and structure. Venous: The inferior vena cava is normal in size with greater than 50% respiratory  variability, suggesting right atrial pressure of 3 mmHg. IAS/Shunts: No atrial level shunt detected by color flow Doppler.  LEFT VENTRICLE PLAX 2D LVIDd:         4.00 cm  Diastology LVIDs:         2.40 cm  LV e' medial:    6.64 cm/s LV IVS:        1.90 cm  LV E/e' medial:  10.8 LVOT diam:     2.20 cm  LV e' lateral:   10.60 cm/s LV SV:         140      LV E/e' lateral: 6.8 LV SV Index:   58 LVOT Area:     3.80 cm  RIGHT VENTRICLE RV Basal diam:  2.50 cm RV S prime:     20.30 cm/s LEFT ATRIUM  Index       RIGHT ATRIUM           Index LA diam:        3.90 cm 1.60 cm/m  RA Area:     12.80 cm LA Vol (A2C):   44.5 ml 18.27 ml/m RA Volume:   25.60 ml  10.51 ml/m LA Vol (A4C):   43.3 ml 17.78 ml/m LA Biplane Vol: 45.1 ml 18.52 ml/m  AORTIC VALVE AV Area (Vmax):    3.36 cm AV Area (Vmean):   3.18 cm AV Area (VTI):     4.40 cm AV Vmax:           196.00 cm/s AV Vmean:          147.000 cm/s AV VTI:            0.319 m AV Peak Grad:      15.4 mmHg AV Mean Grad:      9.0 mmHg LVOT Vmax:         173.00 cm/s LVOT Vmean:        123.000 cm/s LVOT VTI:          0.369 m LVOT/AV VTI ratio: 1.16  AORTA Ao Root diam: 3.40 cm Ao Asc diam:  3.30 cm MITRAL VALVE MV Area (PHT): 2.46 cm    SHUNTS MV Decel Time: 308 msec    Systemic VTI:  0.37 m MV E velocity: 72.00 cm/s  Systemic Diam: 2.20 cm MV A velocity: 72.80 cm/s MV E/A ratio:  0.99 Armanda Magic MD Electronically signed by Armanda Magic MD Signature Date/Time: 03/06/2021/10:44:08 AM    Final    CT HEAD CODE STROKE WO CONTRAST  Result Date: 02/25/2021 CLINICAL DATA:  Neuro deficit, acute, stroke suspected EXAM: CT ANGIOGRAPHY HEAD AND NECK TECHNIQUE: Multidetector CT imaging of the head and neck was performed using the standard protocol during bolus administration of intravenous contrast. Multiplanar CT image reconstructions and MIPs were obtained to evaluate the vascular anatomy. Carotid stenosis measurements (when applicable) are obtained utilizing NASCET  criteria, using the distal internal carotid diameter as the denominator. CONTRAST:  75 mL Omnipaque 350 COMPARISON:  None. FINDINGS: CT HEAD FINDINGS Brain: Large volume subarachnoid hemorrhage is present within the basal cisterns. This extends inferiorly along the brainstem and laterally into the left greater than right sylvian fissures. Scattered parasagittal sulcal subarachnoid hemorrhage. Mild prominence of the ventricles may reflect volume loss or mild hydrocephalus. Gray-white differentiation is preserved. No extra-axial collection. Vascular: Better evaluated on CTA portion. Skull: Unremarkable. Sinuses: Minor mucosal thickening. Orbits: Left lens replacement. Review of the MIP images confirms the above findings CTA NECK FINDINGS Aortic arch: Minor plaque along the arch. Great vessel origins are patent. Right carotid system: Patent. Mild calcified plaque along the distal common carotid and proximal internal carotid. No stenosis. Left carotid system: Patent.  No stenosis. Vertebral arteries: Patent.  No stenosis. Skeleton: No acute osseous abnormality. Other neck: Enteric tube is coiled in the pharynx and proximal esophagus. Upper chest: Included upper lungs are clear. Review of the MIP images confirms the above findings CTA HEAD FINDINGS Anterior circulation: Intracranial internal carotid arteries are patent with mild calcified plaque along the cavernous segments. Additional mild calcified plaque along the paraclinoid portions. No high-grade stenosis. Anterior and middle cerebral arteries are patent. No aneurysm identified. Posterior circulation: Intracranial vertebral arteries are patent. Basilar artery is patent. Major cerebellar artery origins are patent. Posterior cerebral arteries are patent. Venous sinuses: As permitted by contrast timing, patent. Review of  the MIP images confirms the above findings IMPRESSION: Large volume subarachnoid hemorrhage centered within the basal cisterns. Mild prominence of  the ventricles may reflect volume loss or mild hydrocephalus. No evidence of acute infarction. No aneurysm or AVM. These results were communicated to Dr. Thomasena Edis at 8:56 am on 03/04/2021 by text page via the [facility] messaging system. Electronically Signed   By: Guadlupe Spanish M.D.   On: 03/04/2021 09:06   CT ANGIO HEAD NECK W WO CM (CODE STROKE)  Result Date: 02/27/2021 CLINICAL DATA:  Neuro deficit, acute, stroke suspected EXAM: CT ANGIOGRAPHY HEAD AND NECK TECHNIQUE: Multidetector CT imaging of the head and neck was performed using the standard protocol during bolus administration of intravenous contrast. Multiplanar CT image reconstructions and MIPs were obtained to evaluate the vascular anatomy. Carotid stenosis measurements (when applicable) are obtained utilizing NASCET criteria, using the distal internal carotid diameter as the denominator. CONTRAST:  75 mL Omnipaque 350 COMPARISON:  None. FINDINGS: CT HEAD FINDINGS Brain: Large volume subarachnoid hemorrhage is present within the basal cisterns. This extends inferiorly along the brainstem and laterally into the left greater than right sylvian fissures. Scattered parasagittal sulcal subarachnoid hemorrhage. Mild prominence of the ventricles may reflect volume loss or mild hydrocephalus. Gray-white differentiation is preserved. No extra-axial collection. Vascular: Better evaluated on CTA portion. Skull: Unremarkable. Sinuses: Minor mucosal thickening. Orbits: Left lens replacement. Review of the MIP images confirms the above findings CTA NECK FINDINGS Aortic arch: Minor plaque along the arch. Great vessel origins are patent. Right carotid system: Patent. Mild calcified plaque along the distal common carotid and proximal internal carotid. No stenosis. Left carotid system: Patent.  No stenosis. Vertebral arteries: Patent.  No stenosis. Skeleton: No acute osseous abnormality. Other neck: Enteric tube is coiled in the pharynx and proximal esophagus. Upper chest:  Included upper lungs are clear. Review of the MIP images confirms the above findings CTA HEAD FINDINGS Anterior circulation: Intracranial internal carotid arteries are patent with mild calcified plaque along the cavernous segments. Additional mild calcified plaque along the paraclinoid portions. No high-grade stenosis. Anterior and middle cerebral arteries are patent. No aneurysm identified. Posterior circulation: Intracranial vertebral arteries are patent. Basilar artery is patent. Major cerebellar artery origins are patent. Posterior cerebral arteries are patent. Venous sinuses: As permitted by contrast timing, patent. Review of the MIP images confirms the above findings IMPRESSION: Large volume subarachnoid hemorrhage centered within the basal cisterns. Mild prominence of the ventricles may reflect volume loss or mild hydrocephalus. No evidence of acute infarction. No aneurysm or AVM. These results were communicated to Dr. Thomasena Edis at 8:56 am on 03/01/2021 by text page via the [facility] messaging system. Electronically Signed   By: Guadlupe Spanish M.D.   On: 03/02/2021 09:06     PHYSICAL EXAM  Temp:  [97.8 F (36.6 C)-99.4 F (37.4 C)] 99.4 F (37.4 C) (09/20 0743) Pulse Rate:  [59-73] 63 (09/20 1000) Resp:  [13-19] 17 (09/20 1000) BP: (104-157)/(50-65) 152/62 (09/20 1000) SpO2:  [90 %-100 %] 94 % (09/20 1000) FiO2 (%):  [40 %] 40 % (09/20 0800)  General - obese, well developed, intubated and drowsy sleep.  Ophthalmologic - fundi not visualized due to noncooperation.  Cardiovascular - Regular rhythm and rate.  Neuro - intubated on vent, drowsy sleepy but able to open eyes on voice, needed repetitive stimulation to keep eyes open.  Eyes midline, able to have bilateral tracking but incomplete horizontal gaze, not blinking to visual threat bilaterally, pupil bilaterally 2 mm, sluggish to light.  Facial asymmetry not able to test due to ET tube, follow commands for tongue protrusion. Bilateral UEs at  least 3/5, bilaterally LEs at least 3-/5, following commands. Sensation symmetrical subjectively bilaterally, b/l FTN grossly intact, however slow and incomplete, gait not tested.     ASSESSMENT/PLAN Mr. Blayden Conwell is a 75 y.o. male with history of morbid obesity, diabetes, hypertension, hyperlipidemia admitted for headache, nausea vomiting, high blood pressure. No tPA given due to [facility].    SAH: Extensive SAH with IVH, Fisher grade 4, no aneurysm found on CTA and MRA, etiology unclear could be perimesencephalic hemorrhage vs. Hypertensive. Recommend cerebral angiogram CT head showed extensive SAH centered within the basal cisterns with mild IVH CT repeat similar SAH but increased amount of IVH MRI extensive SAH throughout the brain, more pronounced at basal cisterns.  Intraventricular extension.  Multifocal microhemorrhages including right thalamus, left thalamus, midbrain and pons, likely consistent with chronic hypertension. CTA head and neck no AVM or aneurysm MRA no AVM or aneurysm Recommend cerebral angiogram to further evaluate 2D Echo EF 70 to 75%, severe LV hypertrophy TCD monitoring LDL 24 HgbA1c 7.2 SCDs for VTE prophylaxis aspirin 81 mg daily prior to admission, now on No antithrombotic.  Continuing to nimodipine IV 60 mg every 4 hours. Continue Keppra Ongoing aggressive stroke risk factor management Therapy recommendations:  pending Disposition:  pending  Hypertensive emergency BP high on admission Currently on amlodipine, clonidine, nimodipine and Cleviprex. BP goal less than 160  Diabetes HgbA1c 7.2 goal < 7.0 Uncontrolled CBG monitoring SSI DM education and close PCP follow up  Respiratory failure Intubated for airway protection On ventilation support CCM on board  Hyperlipidemia Home meds: Lipitor 80 LDL 24, goal < 70 Hold off statin at this time Consider to resume statin at discharge  Other Stroke Risk Factors Advanced age Quit smoking 27 years  ago Obesity, Body mass index is 41.25 kg/m.   Other Active Problems AKI, creatinine 1.34-1.48-1.5 to  Hospital day # 1  This patient is critically ill due to extensive SAH, IVH, hypertensive emergency, respiratory failure and at significant risk of neurological worsening, death form vasospasm, seizure, hydrocephalus, hypertensive encephalopathy. This patient's care requires constant monitoring of vital signs, hemodynamics, respiratory and cardiac monitoring, review of multiple databases, neurological assessment, discussion with family, other specialists and medical decision making of high complexity. I spent 40 minutes of neurocritical care time in the care of this patient. I had long discussion with brother and nephew at bedside, updated pt current condition, treatment plan and potential prognosis, and answered all the questions.  They expressed understanding and appreciation.  I also discussed with Dr. Johny Drilling and Dr. Dutch Quint.   Neurology will sign off, Dr. Dutch Quint NSG and Dr. Merrily Pew CCM will continue to manage the pt for extensive SAH. Please feel free to call with questions. Thanks for the consult.   Marvel Plan, MD PhD Stroke Neurology 03/06/2021 12:07 PM    To contact Stroke Continuity provider, please refer to [email or web address]. After hours, contact General Neurology

## 2021-03-06 NOTE — Progress Notes (Signed)
PT Cancellation Note  Patient Details Name: Connor [name] MRN: [id number] DOB: Aug 29, 1945   Cancelled Treatment:    Reason Eval/Treat Not Completed: Patient not medically ready. Pt remains on vent and sedated with active bedrest order. Acute PT to return as able, as appropriate to complete PT eval once mobility orders are advanced and pt is medically appropriate.  Lewis Shock, PT, DPT Acute Rehabilitation Services Pager #: [phone] Office #: [phone]    Iona Hansen 03/06/2021, 8:34 AM

## 2021-03-06 NOTE — Progress Notes (Signed)
No new events or problems overnight.  Blood pressure control improved on clonidine.  Remains sedated on Cleviprex.  MRI scan done this morning demonstrates stable appearance of his basilar subarachnoid hemorrhage.  There is a small amount of intraventricular blood.  There is a small thalamic hemorrhage.  No evidence of stroke.  No evidence of any progressive hydrocephalus.  MRA also negative for aneurysm or vascular malformation.  Patient is status post large-volume basilar subarachnoid hemorrhage of unclear etiology.  Patient with a small thalamic hemorrhage suggesting this may be hypertensive in origin.  No evidence of vascular abnormality.  Risk of rehemorrhage very low.  Recommend continue efforts at blood pressure control and supportive care.  Nothing to add beyond this from a neurosurgical standpoint.

## 2021-03-07 ENCOUNTER — Inpatient Hospital Stay (HOSPITAL_COMMUNITY): Payer: Medicare HMO

## 2021-03-07 ENCOUNTER — Ambulatory Visit: Payer: Medicare HMO | Admitting: Endocrinology

## 2021-03-07 DIAGNOSIS — I161 Hypertensive emergency: Secondary | ICD-10-CM | POA: Diagnosis not present

## 2021-03-07 DIAGNOSIS — J9601 Acute respiratory failure with hypoxia: Secondary | ICD-10-CM | POA: Diagnosis not present

## 2021-03-07 DIAGNOSIS — I609 Nontraumatic subarachnoid hemorrhage, unspecified: Secondary | ICD-10-CM

## 2021-03-07 DIAGNOSIS — G9340 Encephalopathy, unspecified: Secondary | ICD-10-CM | POA: Diagnosis not present

## 2021-03-07 LAB — BASIC METABOLIC PANEL
Anion gap: 9 (ref 5–15)
BUN: 16 mg/dL (ref 8–23)
CO2: 19 mmol/L — ABNORMAL LOW (ref 22–32)
Calcium: 9 mg/dL (ref 8.9–10.3)
Chloride: 122 mmol/L — ABNORMAL HIGH (ref 98–111)
Creatinine, Ser: 1.53 mg/dL — ABNORMAL HIGH (ref 0.61–1.24)
GFR, Estimated: 48 mL/min — ABNORMAL LOW (ref >60)
Glucose, Bld: 269 mg/dL — ABNORMAL HIGH (ref 70–99)
Potassium: 4.3 mmol/L (ref 3.5–5.1)
Sodium: 150 mmol/L — ABNORMAL HIGH (ref 135–145)

## 2021-03-07 LAB — GLUCOSE, CAPILLARY
Glucose-Capillary: 142 mg/dL — ABNORMAL HIGH (ref 70–99)
Glucose-Capillary: 156 mg/dL — ABNORMAL HIGH (ref 70–99)
Glucose-Capillary: 168 mg/dL — ABNORMAL HIGH (ref 70–99)
Glucose-Capillary: 198 mg/dL — ABNORMAL HIGH (ref 70–99)
Glucose-Capillary: 259 mg/dL — ABNORMAL HIGH (ref 70–99)
Glucose-Capillary: 323 mg/dL — ABNORMAL HIGH (ref 70–99)

## 2021-03-07 LAB — SODIUM
Sodium: 147 mmol/L — ABNORMAL HIGH (ref 135–145)
Sodium: 154 mmol/L — ABNORMAL HIGH (ref 135–145)
Sodium: 153 mmol/L — ABNORMAL HIGH (ref 135–145)

## 2021-03-07 LAB — CBC
HCT: 48.6 % (ref 39.0–52.0)
Hemoglobin: 15.6 g/dL (ref 13.0–17.0)
MCH: 32.2 pg (ref 26.0–34.0)
MCHC: 32.1 g/dL (ref 30.0–36.0)
MCV: 100.2 fL — ABNORMAL HIGH (ref 80.0–100.0)
Platelets: 138 10*3/uL — ABNORMAL LOW (ref 150–400)
RBC: 4.85 MIL/uL (ref 4.22–5.81)
RDW: 14.7 % (ref 11.5–15.5)
WBC: 15 10*3/uL — ABNORMAL HIGH (ref 4.0–10.5)
nRBC: 0 % (ref 0.0–0.2)

## 2021-03-07 LAB — PHOSPHORUS
Phosphorus: 2.6 mg/dL (ref 2.5–4.6)
Phosphorus: 2.8 mg/dL (ref 2.5–4.6)

## 2021-03-07 LAB — MAGNESIUM
Magnesium: 2.2 mg/dL (ref 1.7–2.4)
Magnesium: 2.4 mg/dL (ref 1.7–2.4)

## 2021-03-07 MED ORDER — INSULIN DETEMIR 100 UNIT/ML ~~LOC~~ SOLN
15.0000 [IU] | Freq: Two times a day (BID) | SUBCUTANEOUS | Status: DC
  Administered 2021-03-07: 15 [IU] via SUBCUTANEOUS
  Filled 2021-03-07 (×4): qty 0.15

## 2021-03-07 MED ORDER — LISINOPRIL 20 MG PO TABS
40.0000 mg | ORAL_TABLET | Freq: Every day | ORAL | Status: DC
  Administered 2021-03-09 – 2021-03-10 (×2): 40 mg
  Filled 2021-03-07: qty 2
  Filled 2021-03-07: qty 4

## 2021-03-07 MED ORDER — CLONIDINE HCL 0.2 MG PO TABS
0.3000 mg | ORAL_TABLET | Freq: Three times a day (TID) | ORAL | Status: DC
  Administered 2021-03-08 – 2021-03-10 (×6): 0.3 mg
  Filled 2021-03-07 (×6): qty 1

## 2021-03-07 MED ORDER — BRIMONIDINE TARTRATE 0.15 % OP SOLN
1.0000 [drp] | Freq: Two times a day (BID) | OPHTHALMIC | Status: DC
  Administered 2021-03-07 – 2021-03-11 (×9): 1 [drp] via OPHTHALMIC
  Filled 2021-03-07: qty 5

## 2021-03-07 MED ORDER — DEXMEDETOMIDINE HCL IN NACL 400 MCG/100ML IV SOLN
0.0000 ug/kg/h | INTRAVENOUS | Status: DC
  Administered 2021-03-07: 0.8 ug/kg/h via INTRAVENOUS
  Administered 2021-03-07: 0.2 ug/kg/h via INTRAVENOUS
  Administered 2021-03-08: 0.8 ug/kg/h via INTRAVENOUS
  Administered 2021-03-08: 0.6 ug/kg/h via INTRAVENOUS
  Administered 2021-03-08: 0.8 ug/kg/h via INTRAVENOUS
  Administered 2021-03-08: 0.6 ug/kg/h via INTRAVENOUS
  Administered 2021-03-09 (×2): 0.4 ug/kg/h via INTRAVENOUS
  Administered 2021-03-09: 0.8 ug/kg/h via INTRAVENOUS
  Administered 2021-03-09: 0.6 ug/kg/h via INTRAVENOUS
  Administered 2021-03-10: 0.4 ug/kg/h via INTRAVENOUS
  Filled 2021-03-07 (×11): qty 100

## 2021-03-07 MED ORDER — SODIUM CHLORIDE 0.9 % IV SOLN
2.0000 g | Freq: Every day | INTRAVENOUS | Status: DC
  Administered 2021-03-07: 2 g via INTRAVENOUS
  Filled 2021-03-07 (×2): qty 20

## 2021-03-07 MED ORDER — DORZOLAMIDE HCL 2 % OP SOLN
1.0000 [drp] | Freq: Two times a day (BID) | OPHTHALMIC | Status: DC
  Administered 2021-03-07 – 2021-03-10 (×8): 1 [drp] via OPHTHALMIC
  Filled 2021-03-07: qty 10

## 2021-03-07 NOTE — Procedures (Signed)
Extubation Procedure Note  Patient Details:   Name: Connor [name] DOB: Mar 08, 1946 MRN: [id number]   Airway Documentation:    Vent end date: 03/07/21 Vent end time: 0858   Evaluation  O2 sats: stable throughout Complications: No apparent complications Patient did tolerate procedure well. Bilateral Breath Sounds: Diminished, Clear   Yes  Pt was extubated per MD order and placed on 3 L [place]. Cuff leak was noted prior to extubation and no stridor post. Pt has stable vitals at this time. Rt will monitor.   Merlene Laughter 03/07/2021, 8:59 AM

## 2021-03-07 NOTE — Progress Notes (Signed)
Transcranial Doppler  Date POD PCO2 HCT BP  MCA ACA PCA OPHT SIPH VERT Basilar  9/21 MES     Right  Left   *  *   *  *   *  *   18  18   *  *   *  *   *           Right  Left                                         Right  Left                                             Right  Left                                             Right  Left                                            Right  Left                                            Right  Left                                        MCA = Middle Cerebral Artery      OPHT = Opthalmic Artery     BASILAR = Basilar Artery   ACA = Anterior Cerebral Artery     SIPH = Carotid Siphon PCA = Posterior Cerebral Artery   VERT = Verterbral Artery                   Normal MCA = 62+\-12 ACA = 50+\-12 PCA = 42+\-23  *-unable to insonate  [name] 03/07/2021 2:27 PM

## 2021-03-07 NOTE — Progress Notes (Signed)
OT Cancellation Note  Patient Details Name: Verland Sprinkle MRN: [id number] DOB: May 04, 1946   Cancelled Treatment:    Reason Eval/Treat Not Completed: Patient not medically ready;Active bedrest order OT order received and appreciated however this conflicts with current bedrest order set. Please increase activity tolerance as appropriate and remove bedrest from orders. . Please contact OT at [phone] if bed rest order is discontinued. OT will hold evaluation at this time and will check back as time allows pending increased activity orders.   Wynona Neat, OTR/L  Acute Rehabilitation Services Pager: [phone] Office: [phone] .  03/07/2021, 8:22 AM

## 2021-03-07 NOTE — Progress Notes (Signed)
PT Cancellation Note  Patient Details Name: Connor [name] MRN: [id number] DOB: 11-23-1945   Cancelled Treatment:    Reason Eval/Treat Not Completed: Active bedrest order PT will re-attempt once bed rest orders are lifted.   Avonelle Viveros A. Dan Humphreys PT, DPT Acute Rehabilitation Services Pager [phone] Office [phone]    Viviann Spare 03/07/2021, 7:55 AM

## 2021-03-07 NOTE — Progress Notes (Signed)
[name], MRN:  [id number], DOB:  31-Jul-1945, LOS: 2 ADMISSION DATE:  03/11/2021, CONSULTATION DATE:  02/22/2021 REFERRING MD:  Thomasena Edis CHIEF COMPLAINT:  AMS   History of Present Illness:  Connor [name] is a 75 y.o. male who has a PMH as outlined below who presented to [facility] ED 9/19 as code stroke.  He woke up his normal self that morning but then had acute onset of severe headache. He called EMS and upon their arrival, he had AMS and left facial droop.  SBP was noted to be 270.  He was brought to ED where CT head demonstrated SAH.  He was intubated for airway protection and was started on Cleviprex for BP control with goal SBP 120 - 140.  Significant Hospital Events: Including procedures, antibiotic start and stop dates in addition to other pertinent events   9/19 > admit. 9/20 > transferred to [facility] service 9/21 > tolerated spontaneous breathing trial, extubated  Interim History / Subjective:  Sedation was turned off, tolerated spontaneous breathing trial, extubated Remained agitated and encephalopathic  Objective:  Blood pressure (!) 138/54, pulse (!) 111, temperature (!) 100.8 F (38.2 C), temperature source Axillary, resp. rate 18, height 5\' 10"  (1.778 m), weight 130.4 kg, SpO2 92 %.    Vent Mode: PSV;CPAP FiO2 (%):  [40 %] 40 % Set Rate:  [18 bmp] 18 bmp Vt Set:  [570 mL] 570 mL PEEP:  [5 cmH20-8 cmH20] 8 cmH20 Pressure Support:  [15 cmH20] 15 cmH20 Plateau Pressure:  [15 cmH20-17 cmH20] 15 cmH20   Intake/Output Summary (Last 24 hours) at 03/07/2021 1001 Last data filed at 03/07/2021 0700 Gross per 24 hour  Intake 2387.63 ml  Output 2035 ml  Net 352.63 ml   Filed Weights   02/24/2021 0821 03/12/2021 0856 02/25/2021 1145  Weight: 132 kg 132.9 kg 130.4 kg    Examination:   Physical exam: General: Crtitically ill-appearing morbidly obese African-American male, lying on the bed, agitated HEENT: [place]/AT, eyes anicteric.  Moist mucous membranes Neuro: Lethargic, opens  eyes with vocal stimuli, moving all 4 extremities antigravity, intermittently following few commands Chest: Coarse breath sounds, no wheezes or rhonchi Heart: Regular rate and rhythm, no murmurs or gallops Abdomen: Soft, nontender, nondistended, bowel sounds present Skin: No rash  Resolved Hospital Problem list:    Assessment & Plan:  H/H 2, MF 4 subarachnoid hemorrhage in the setting of hypertensive emergency Acute encephalopathy Multifocal intraparenchymal hemorrhage related to uncontrolled hypertension Induced hypernatremia Continue neuro watch every hour SBP goal 120 - 140 CTA head and neck and MRA negative for aneurysm Continue empiric Keppra, Nimodipine for now. Appreciate neurosurgical input, recommend no acute intervention Patient may have repeat CTA versus DSA in few days Blood pressure is not well controlled Still requiring high-dose clevidipine infusion Continue amlodipine, lisinopril Added clonidine 0.3 mg 3 times daily Continue as needed labetalol Minimize sedation Patient may be started on Precedex considering agitation and coming out of bed Patient was on 3% saline, it was stopped now serum sodium is 150 Closely monitor  Acute hypoxic respiratory failure with possible community-acquired pneumonia Patient tolerated his prior breathing trial, he was successfully extubated He has thick tan secretions Respiratory culture was sent Started on IV ceftriaxone  AKI Serum creatinine remains elevated up to 1.5 Closely monitor Avoid nephrotoxic agent  Diabetes type 2 with hyperglycemia Started on Levemir Continue sliding scale insulin  Morbid obesity Nutritionist follow-up  Best practice (evaluated daily):  Diet/type: NPO DVT prophylaxis: SCD GI prophylaxis: PPI Lines:  N/A Foley:  Yes, and it is still needed Code Status:  full code Last date of multidisciplinary goals of care discussion: 9/21: Updated patient's sons at bedside, decision was to continue full  aggressive care   Total critical care time: 37 minutes  Performed by: Cheri Fowler   Critical care time was exclusive of separately billable procedures and treating other patients.   Critical care was necessary to treat or prevent imminent or life-threatening deterioration.   Critical care was time spent personally by me on the following activities: development of treatment plan with patient and/or surrogate as well as nursing, discussions with consultants, evaluation of patient's response to treatment, examination of patient, obtaining history from patient or surrogate, ordering and performing treatments and interventions, ordering and review of laboratory studies, ordering and review of radiographic studies, pulse oximetry and re-evaluation of patient's condition.   Cheri Fowler MD [facility] See Amion for pager If no response to pager, please call [phone] until 7pm After 7pm, Please call E-link [phone]

## 2021-03-07 NOTE — Progress Notes (Addendum)
SLP Cancellation Note  Patient Details Name: Harel Repetto MRN: [id number] DOB: Oct 22, 1945   Cancelled treatment:        Yevonne Pax with RN due to decreased alertness. SLP will attempt BSE tomorrow and SLE   Royce Macadamia 03/07/2021, 4:56 PM  Breck Coons Lonell Face.Ed Nurse, children's [phone] Office [phone]

## 2021-03-08 ENCOUNTER — Inpatient Hospital Stay (HOSPITAL_COMMUNITY): Payer: Medicare HMO

## 2021-03-08 DIAGNOSIS — J9601 Acute respiratory failure with hypoxia: Secondary | ICD-10-CM | POA: Diagnosis not present

## 2021-03-08 DIAGNOSIS — I609 Nontraumatic subarachnoid hemorrhage, unspecified: Secondary | ICD-10-CM | POA: Diagnosis not present

## 2021-03-08 DIAGNOSIS — I161 Hypertensive emergency: Secondary | ICD-10-CM | POA: Diagnosis not present

## 2021-03-08 DIAGNOSIS — G9340 Encephalopathy, unspecified: Secondary | ICD-10-CM | POA: Diagnosis not present

## 2021-03-08 LAB — BASIC METABOLIC PANEL
Anion gap: 9 (ref 5–15)
BUN: 13 mg/dL (ref 8–23)
CO2: 20 mmol/L — ABNORMAL LOW (ref 22–32)
Calcium: 9.5 mg/dL (ref 8.9–10.3)
Chloride: 122 mmol/L — ABNORMAL HIGH (ref 98–111)
Creatinine, Ser: 1.36 mg/dL — ABNORMAL HIGH (ref 0.61–1.24)
GFR, Estimated: 55 mL/min — ABNORMAL LOW (ref >60)
Glucose, Bld: 175 mg/dL — ABNORMAL HIGH (ref 70–99)
Potassium: 4.1 mmol/L (ref 3.5–5.1)
Sodium: 151 mmol/L — ABNORMAL HIGH (ref 135–145)

## 2021-03-08 LAB — GLUCOSE, CAPILLARY
Glucose-Capillary: 152 mg/dL — ABNORMAL HIGH (ref 70–99)
Glucose-Capillary: 199 mg/dL — ABNORMAL HIGH (ref 70–99)
Glucose-Capillary: 179 mg/dL — ABNORMAL HIGH (ref 70–99)
Glucose-Capillary: 258 mg/dL — ABNORMAL HIGH (ref 70–99)
Glucose-Capillary: 276 mg/dL — ABNORMAL HIGH (ref 70–99)
Glucose-Capillary: 334 mg/dL — ABNORMAL HIGH (ref 70–99)

## 2021-03-08 LAB — SODIUM
Sodium: 151 mmol/L — ABNORMAL HIGH (ref 135–145)
Sodium: 148 mmol/L — ABNORMAL HIGH (ref 135–145)

## 2021-03-08 LAB — POCT I-STAT 7, (LYTES, BLD GAS, ICA,H+H)
Acid-base deficit: 2 mmol/L (ref 0.0–2.0)
Bicarbonate: 21.1 mmol/L (ref 20.0–28.0)
Calcium, Ion: 1.38 mmol/L (ref 1.15–1.40)
HCT: 50 % (ref 39.0–52.0)
Hemoglobin: 17 g/dL (ref 13.0–17.0)
O2 Saturation: 95 %
Potassium: 3.5 mmol/L (ref 3.5–5.1)
Sodium: 153 mmol/L — ABNORMAL HIGH (ref 135–145)
TCO2: 22 mmol/L (ref 22–32)
pCO2 arterial: 32.2 mmHg (ref 32.0–48.0)
pH, Arterial: 7.425 (ref 7.350–7.450)
pO2, Arterial: 74 mmHg — ABNORMAL LOW (ref 83.0–108.0)

## 2021-03-08 LAB — CBC
HCT: 50.9 % (ref 39.0–52.0)
Hemoglobin: 16.5 g/dL (ref 13.0–17.0)
MCH: 32 pg (ref 26.0–34.0)
MCHC: 32.4 g/dL (ref 30.0–36.0)
MCV: 98.8 fL (ref 80.0–100.0)
Platelets: 107 10*3/uL — ABNORMAL LOW (ref 150–400)
RBC: 5.15 MIL/uL (ref 4.22–5.81)
RDW: 14.2 % (ref 11.5–15.5)
WBC: 19.1 10*3/uL — ABNORMAL HIGH (ref 4.0–10.5)
nRBC: 0 % (ref 0.0–0.2)

## 2021-03-08 LAB — PHOSPHORUS: Phosphorus: 3.1 mg/dL (ref 2.5–4.6)

## 2021-03-08 LAB — MAGNESIUM: Magnesium: 2.1 mg/dL (ref 1.7–2.4)

## 2021-03-08 IMAGING — DX DG ABDOMEN 1V
1 series · 2 of 2 positions shown · non-contrast
Comparison: [DATE]

CLINICAL DATA: Orogastric tube placement

EXAM:
ABDOMEN - 1 VIEW

[Series 1: abdomen kub · 0.14mm/px · 2 of 2 slices shown]
[im 1/2]
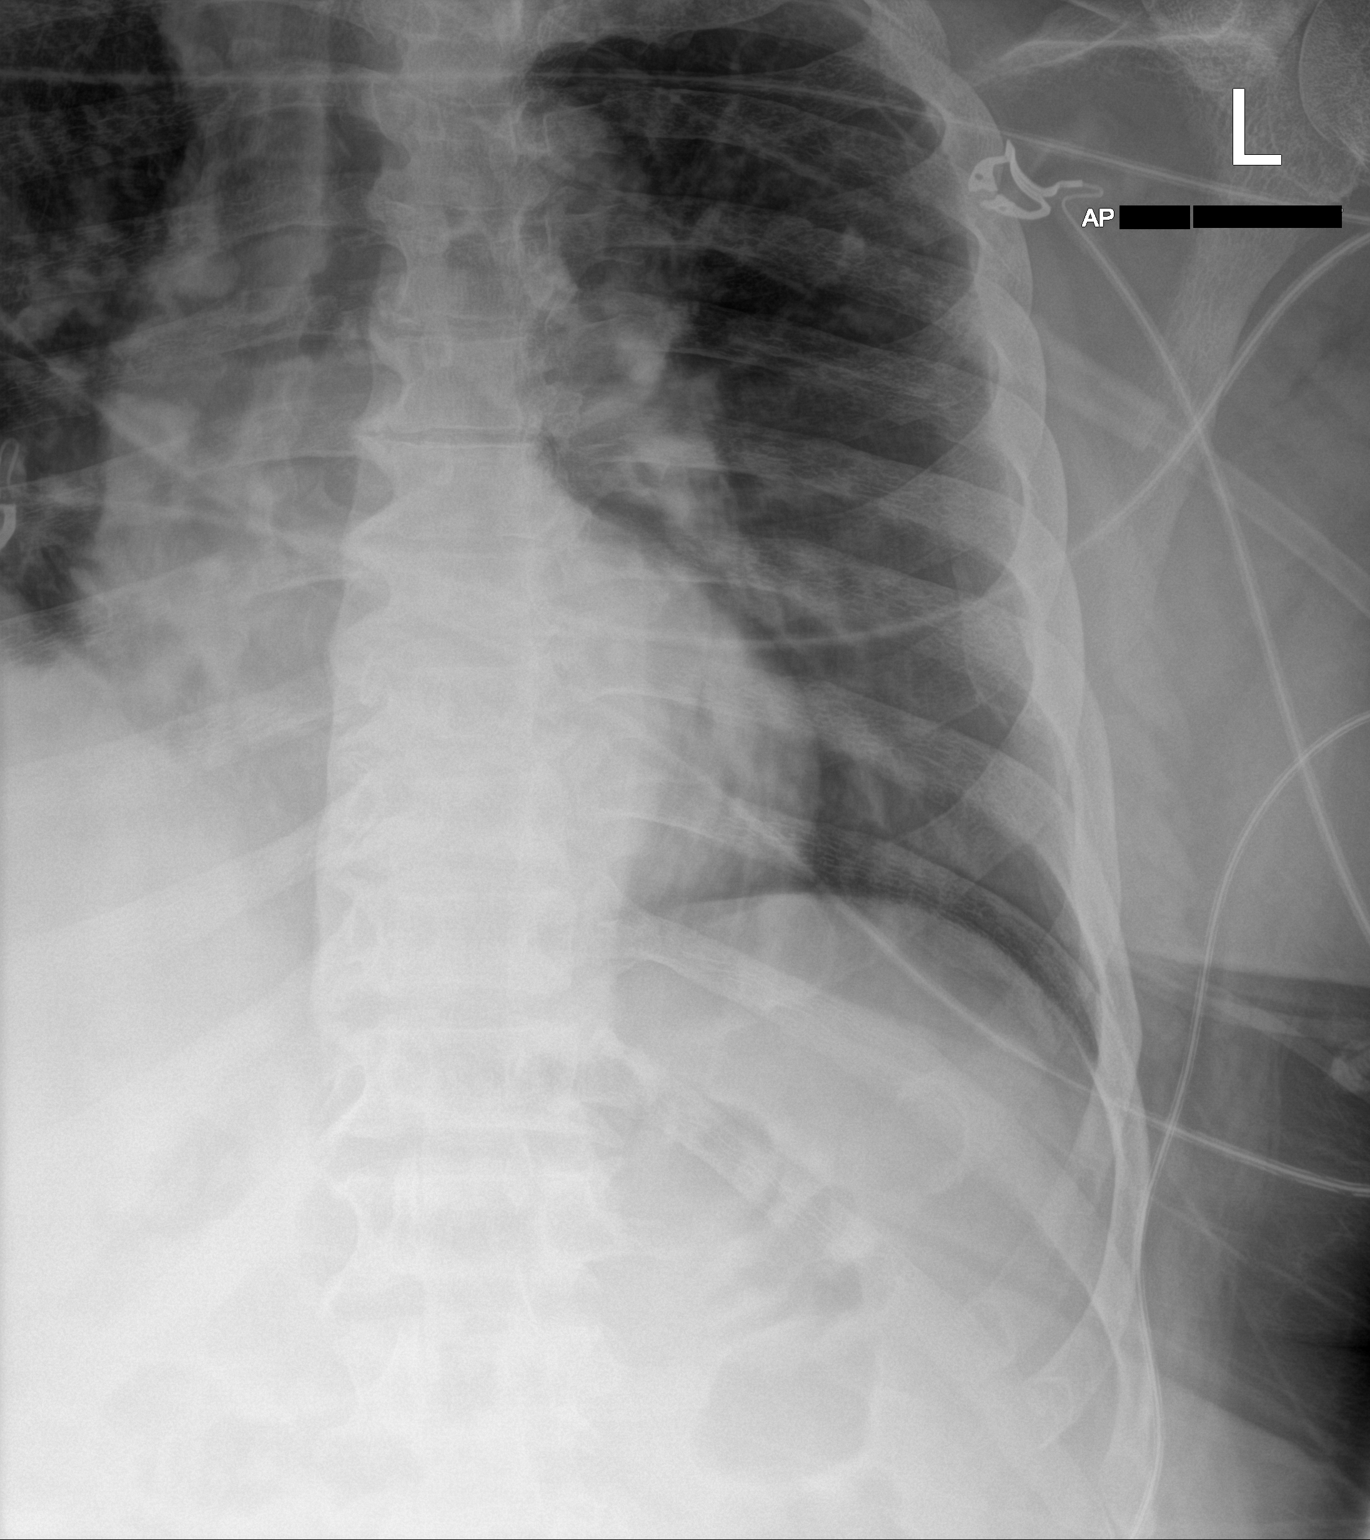
[im 2/2]
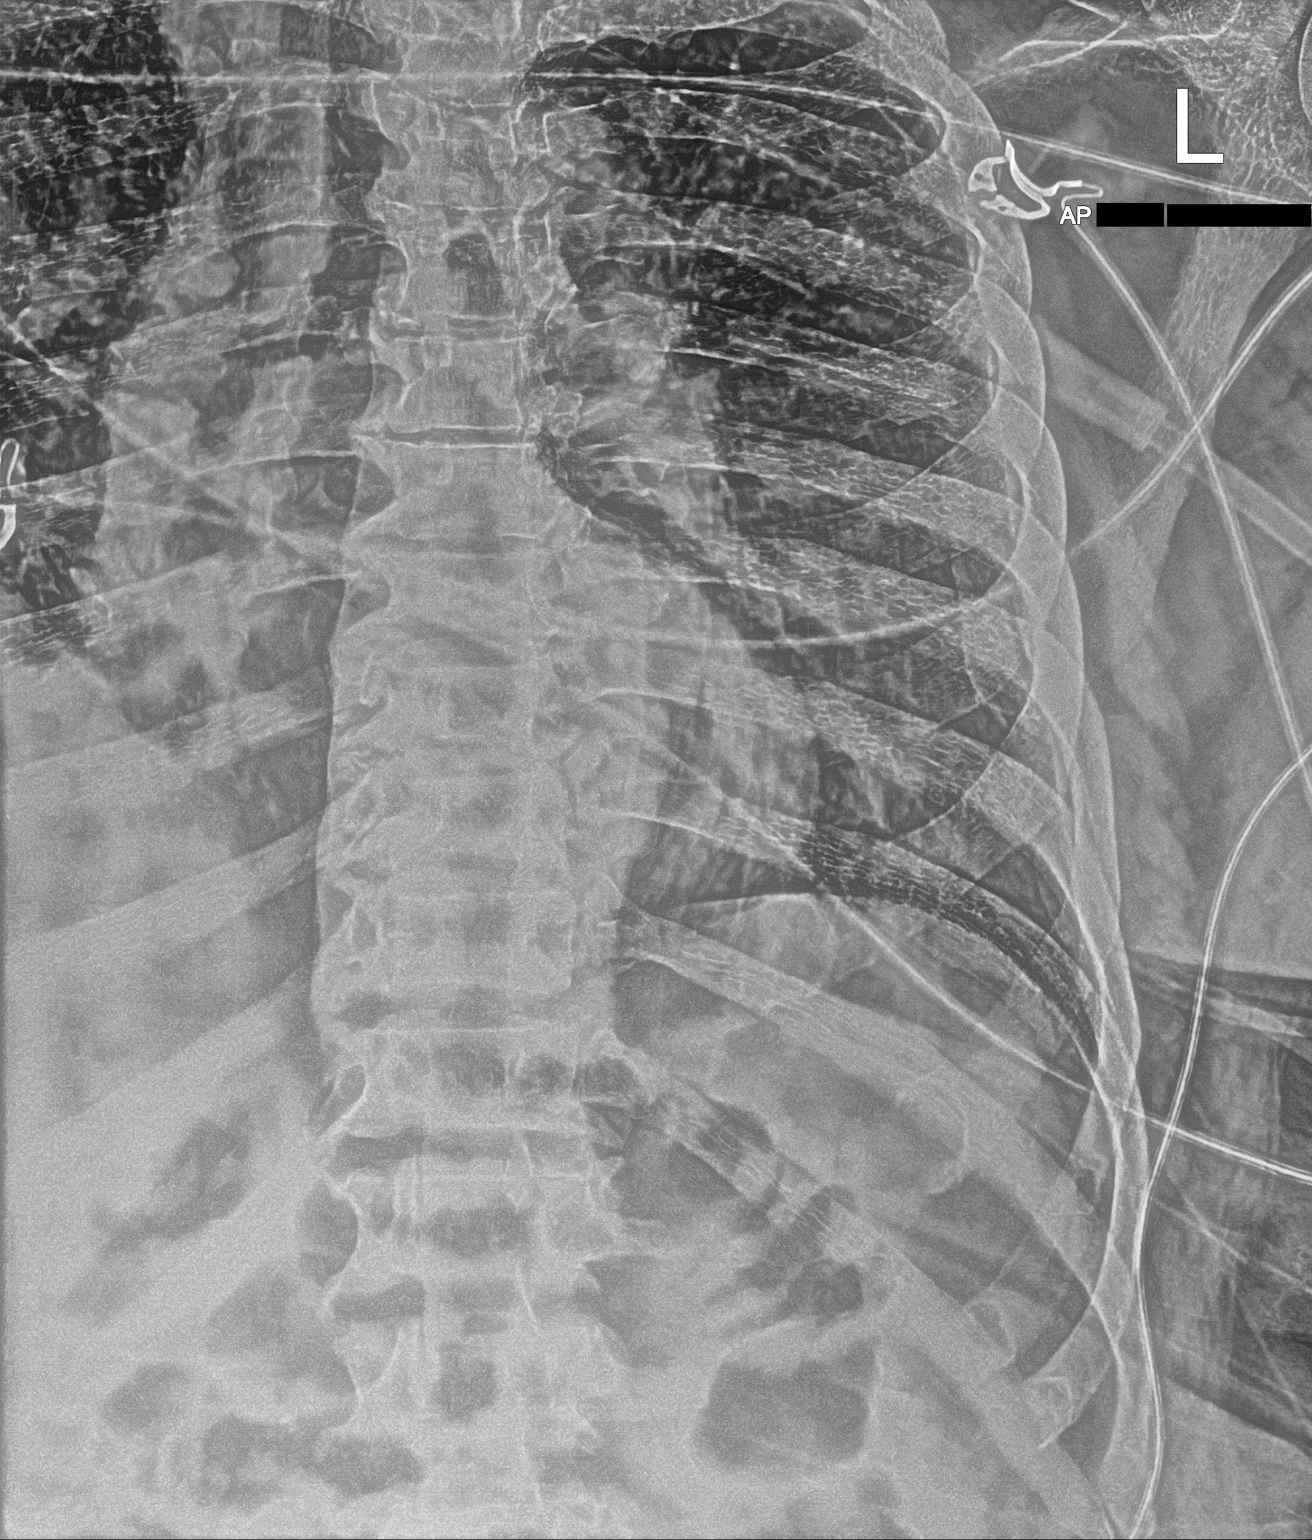

[2 of 2 positions shown; findings below may reference images not displayed]

FINDINGS: Orogastric tube not identified within the visualized chest and upper
abdomen. Visualized left hemithorax is clear. Cardiac size within
normal limits.
IMPRESSION: Orogastric tube not identified within the visualized chest and
abdomen. Correlation for malposition within the oropharynx is
recommended.

These results will be called to the ordering clinician or
representative by the Radiologist Assistant, and communication
documented in the PACS or [REDACTED].

## 2021-03-08 IMAGING — DX DG ABDOMEN 1V
1 series · 1 of 1 positions shown · non-contrast
Comparison: None.

CLINICAL DATA: Enteric tube placement

EXAM:
ABDOMEN - 1 VIEW

[abdomen]
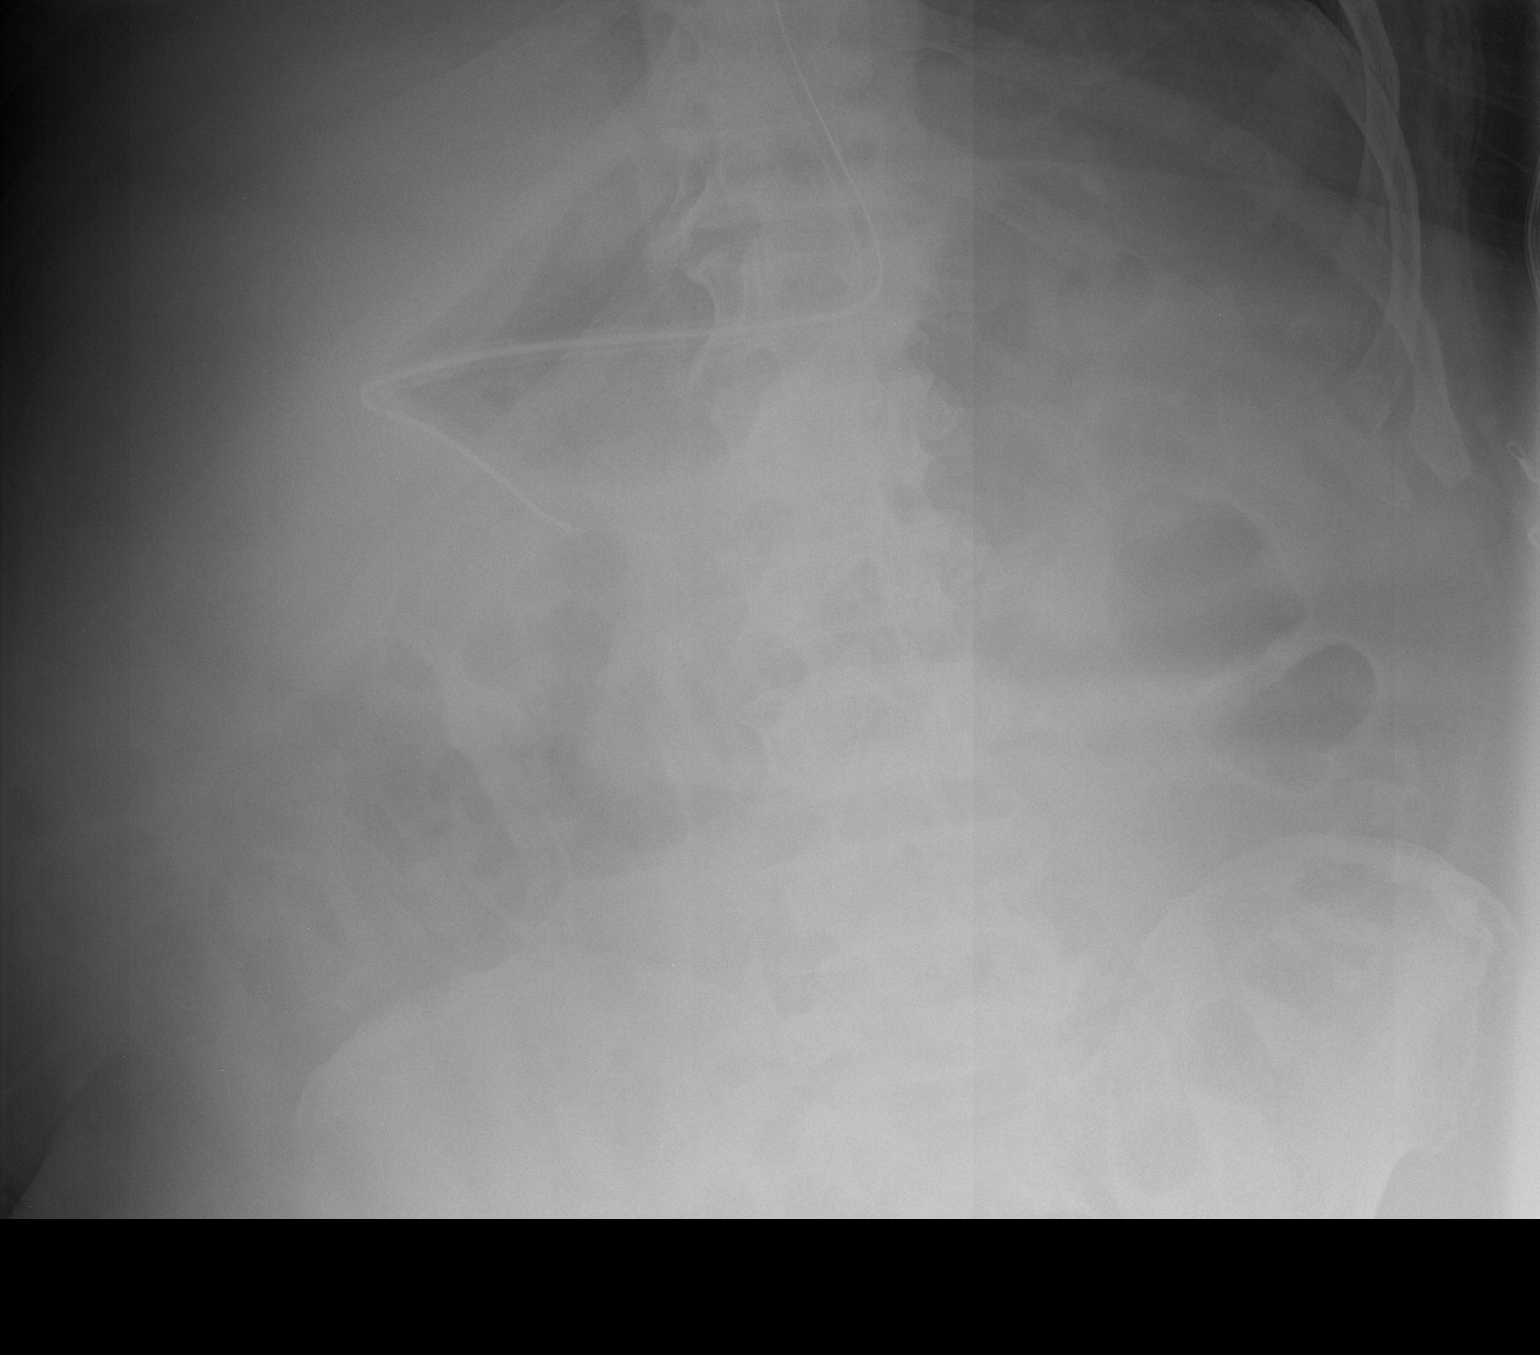

[1 of 1 positions shown; findings below may reference images not displayed]

FINDINGS: Enteric tube with tip terminating in the gastric antrum versus
proximal duodenum. Scattered air-filled loops of bowel are seen. No
abnormal calcification or unexpected radiopaque foreign body. The
osseous structures are unremarkable.
IMPRESSION: Enteric tube with tip terminating either in the gastric antrum or
proximal duodenum.

## 2021-03-08 MED ORDER — BISACODYL 10 MG RE SUPP
10.0000 mg | Freq: Once | RECTAL | Status: AC
  Administered 2021-03-08: 10 mg via RECTAL
  Filled 2021-03-08: qty 1

## 2021-03-08 MED ORDER — SODIUM CHLORIDE 0.9 % IV SOLN
3.0000 g | Freq: Four times a day (QID) | INTRAVENOUS | Status: DC
  Administered 2021-03-08 – 2021-03-11 (×12): 3 g via INTRAVENOUS
  Filled 2021-03-08 (×13): qty 8

## 2021-03-08 MED ORDER — INSULIN DETEMIR 100 UNIT/ML ~~LOC~~ SOLN
18.0000 [IU] | Freq: Two times a day (BID) | SUBCUTANEOUS | Status: DC
  Administered 2021-03-08 – 2021-03-09 (×3): 18 [IU] via SUBCUTANEOUS
  Filled 2021-03-08 (×4): qty 0.18

## 2021-03-08 NOTE — Progress Notes (Signed)
3x unsuccessful NG tube placement. MD Chand made aware.

## 2021-03-08 NOTE — Evaluation (Signed)
Occupational Therapy Evaluation Patient Details Name: Connor [name] MRN: [id number] DOB: 24-Jan-1946 Today's Date: 03/08/2021   History of Present Illness 75 yo male presenting to ED via EMS on 9/19 with HA and L side facial droop. MRI showing SAH scattered throughout brain, most pronounced at the basilar cisterns as well as acute intraparenchymal hemorrhage at ventral R thalamus and L thalamus, midbrain, and pons. Intubated 9/19-9/21. PMH including  uncontrolled HTN, HLD, and IDDM.   Clinical Impression   PTA, pt was living alone and was independent. Pt currently requiring Total A for bathing, dressing, and bed mobility. Per MD order, limiting OT eval to bed level. Pt participating in washing his face at bed level with Mod A for initiating and grasp with RUE. Pt presenting with R gaze, LUE weakness, decreased arousal, and cognitive deficits. Despite fatigue and arousal, pt following one step commands and very agreeable to participate in therapy. Sons present and very supportive. Noting twitching intermittently during session with BUE extension and slight R gaze; notified RN. Pt would benefit from further acute OT to facilitate safe dc. Recommend dc to CIR for further OT to optimize safety, independence with ADLs, and return to PLOF.      Recommendations for follow up therapy are one component of a multi-disciplinary discharge planning process, led by the attending physician.  Recommendations may be updated based on patient status, additional functional criteria and insurance authorization.   Follow Up Recommendations  CIR    Equipment Recommendations  Other (comment) (Defer to next venue)    Recommendations for Other Services Rehab consult     Precautions / Restrictions Precautions Precautions: Fall;Other (comment) (watch SpO2)      Mobility Bed Mobility Overal bed mobility: Needs Assistance             General bed mobility comments: Total A for repositioning in bed     Transfers                 General transfer comment: Defer to next venue    Balance                                           ADL either performed or assessed with clinical judgement   ADL Overall ADL's : Needs assistance/impaired Eating/Feeding: NPO   Grooming: Wash/dry face;Moderate assistance;Bed level Grooming Details (indicate cue type and reason): Washing his face with Mod A for intiating and gaining grasp                             Functional mobility during ADLs:  (defer per MD) General ADL Comments: Total A for ADLs and bed mobility     Vision Baseline Vision/History: 1 Wears glasses (Son report he has vision problems (DR?) at baseline and wears glasses. No longer drives at night. Has had mutiple sx to releave pressure on lens.) Vision Assessment?: Vision impaired- to be further tested in functional context;Yes Eye Alignment: Impaired (comment) Ocular Range of Motion: Restricted on the left Alignment/Gaze Preference: Gaze right (Head turn from R to midline) Visual Fields: Left visual field deficit Additional Comments: Noting dysconjugate gaze. Difficulty with tracking and head turn to L.     Perception Perception Perception Tested?: Yes Perception Deficits: Inattention/neglect Inattention/Neglect: Does not attend to left side of body;Does not attend to left visual field   Praxis  Pertinent Vitals/Pain Pain Assessment: Faces Faces Pain Scale: No hurt Pain Intervention(s): Monitored during session     Hand Dominance Left   Extremity/Trunk Assessment Upper Extremity Assessment Upper Extremity Assessment: RUE deficits/detail;LUE deficits/detail;Difficult to assess due to impaired cognition RUE Deficits / Details: Able to follow one step commands and complete composite flexion/extension and thumbs up. Unabel to follow for opposition. AAROM for wrist, elbow, and shoulder. RUE Coordination: decreased gross motor;decreased  fine motor LUE Deficits / Details: Weak grasp and gross strength. Tendency for extension when attempting AROM. edema LUE Coordination: decreased gross motor;decreased fine motor   Lower Extremity Assessment Lower Extremity Assessment: Defer to PT evaluation   Cervical / Trunk Assessment Cervical / Trunk Assessment: Other exceptions Cervical / Trunk Exceptions: Increased body habitus   Communication Communication Communication: Other (comment) (mumbling)   Cognition Arousal/Alertness: Awake/alert Behavior During Therapy: WFL for tasks assessed/performed Overall Cognitive Status: Within Functional Limits for tasks assessed                                     General Comments  VSS on 15L via NRB. Sons present throughout session. Also noting "twitching" intermittently with extension of BUEs and slight R gaze. Notified RN    Exercises Exercises: General Upper Extremity General Exercises - Upper Extremity Shoulder Flexion: AAROM;Both;15 reps;Supine Shoulder Extension: AAROM;Both;15 reps;Supine Elbow Flexion: AAROM;Both;15 reps;Supine Elbow Extension: AAROM;Both;15 reps;Supine   Shoulder Instructions      Home Living Family/patient expects to be discharged to:: Private residence Living Arrangements: Alone Available Help at Discharge: Family;Available PRN/intermittently Type of Home: Independent living facility (townhome) Home Access: Stairs to enter Entrance Stairs-Number of Steps: 1   Home Layout: Two level;Full bath on main level Alternate Level Stairs-Number of Steps: flight; chair lift   Bathroom Shower/Tub: Tub/shower unit   Bathroom Toilet: Handicapped height     Home Equipment: None (unsure)   Additional Comments: Son's reports that he lives in a "35 and older" community.      Prior Functioning/Environment Level of Independence: Independent        Comments: ADLs, IADLs, driving; no night driving        OT Problem List: Decreased  strength;Decreased range of motion;Decreased activity tolerance      OT Treatment/Interventions: Self-care/ADL training;Therapeutic exercise;Neuromuscular education;Energy conservation;DME and/or AE instruction;Therapeutic activities;Patient/family education;Cognitive remediation/compensation;Visual/perceptual remediation/compensation;Balance training    OT Goals(Current goals can be found in the care plan section) Acute Rehab OT Goals Patient Stated Goal: Agreeable to rehab OT Goal Formulation: With family Time For Goal Achievement: 03/22/21 Potential to Achieve Goals: Good  OT Frequency: Min 2X/week   Barriers to D/C:            Co-evaluation PT/OT/SLP Co-Evaluation/Treatment: Yes Reason for Co-Treatment: For patient/therapist safety;To address functional/ADL transfers   OT goals addressed during session: ADL's and self-care      AM-PAC OT "6 Clicks" Daily Activity     Outcome Measure Help from another person eating meals?: Total Help from another person taking care of personal grooming?: A Lot Help from another person toileting, which includes using toliet, bedpan, or urinal?: Total Help from another person bathing (including washing, rinsing, drying)?: Total Help from another person to put on and taking off regular upper body clothing?: Total Help from another person to put on and taking off regular lower body clothing?: Total 6 Click Score: 7   End of Session Equipment Utilized During Treatment: Oxygen Nurse Communication: Mobility  status  Activity Tolerance: Patient limited by fatigue Patient left: in bed;with bed alarm set;with call bell/phone within reach;with restraints reapplied;with family/visitor present  OT Visit Diagnosis: Other abnormalities of gait and mobility (R26.89);Unsteadiness on feet (R26.81);Muscle weakness (generalized) (M62.81);Hemiplegia and hemiparesis Hemiplegia - Right/Left: Left Hemiplegia - dominant/non-dominant: Dominant Hemiplegia - caused  by: Nontraumatic SAH                Time: 6314-9702 OT Time Calculation (min): 38 min Charges:  OT General Charges $OT Visit: 1 Visit OT Evaluation $OT Eval High Complexity: 1 High OT Treatments $Self Care/Home Management : 8-22 mins  Merica Prell MSOT, OTR/L Acute Rehab Pager: [phone] Office: [phone]  Connor [name] Connor [name] 03/08/2021, 1:18 PM

## 2021-03-08 NOTE — Evaluation (Signed)
Physical Therapy Evaluation Patient Details Name: Connor [name] MRN: [id number] DOB: 05/09/1946 Today's Date: 03/08/2021  History of Present Illness  75 yo male presenting to ED via EMS on 9/19 with HA and L side facial droop. MRI showing SAH scattered throughout brain, most pronounced at the basilar cisterns as well as acute intraparenchymal hemorrhage at ventral R thalamus and L thalamus, midbrain, and pons. Intubated 9/19-9/21. PMH including  uncontrolled HTN, HLD, and IDDM.  Clinical Impression  Patient presents with decreased mobility due to general weakness, decreased L attention/awareness and decreased strength.  Limited evaluation performed due to MD deferring EOB/OOB activity at this time.  Patient at times restless and with restraints.  Feel he will benefit from skilled PT in the acute setting to allow d/c home with assist.  Hopeful for progression to show potential to tolerate intensive skilled rehab at CIR level.         Recommendations for follow up therapy are one component of a multi-disciplinary discharge planning process, led by the attending physician.  Recommendations may be updated based on patient status, additional functional criteria and insurance authorization.  Follow Up Recommendations CIR    Equipment Recommendations  Other (comment) (to be determined)    Recommendations for Other Services Rehab consult     Precautions / Restrictions Precautions Precautions: Fall;Other (comment) Precaution Comments: watch SpO2      Mobility  Bed Mobility Overal bed mobility: Needs Assistance             General bed mobility comments: +2 total A for scooting in bed, deferred EOB/OOB per MD    Transfers                 General transfer comment: Defer to next venue  Ambulation/Gait                Stairs            Wheelchair Mobility    Modified Rankin (Stroke Patients Only) Modified Rankin (Stroke Patients Only) Pre-Morbid Rankin Score:  No significant disability Modified Rankin: Severe disability     Balance                                             Pertinent Vitals/Pain Pain Assessment: Faces Faces Pain Scale: No hurt Pain Intervention(s): Monitored during session    Home Living Family/patient expects to be discharged to:: Private residence Living Arrangements: Alone Available Help at Discharge: Family;Available PRN/intermittently Type of Home: Independent living facility (townhome) Home Access: Stairs to enter   Entrance Stairs-Number of Steps: 1 Home Layout: Two level;Full bath on main level Home Equipment: None Additional Comments: Son's reports that he lives in a "64 and older" community.    Prior Function Level of Independence: Independent         Comments: ADLs, IADLs, driving; no night driving     Hand Dominance   Dominant Hand: Left    Extremity/Trunk Assessment   Upper Extremity Assessment Upper Extremity Assessment: Defer to OT evaluation RUE Deficits / Details: Able to follow one step commands and complete composite flexion/extension and thumbs up. Unabel to follow for opposition. AAROM for wrist, elbow, and shoulder. RUE Coordination: decreased gross motor;decreased fine motor LUE Deficits / Details: Weak grasp and gross strength. Tendency for extension when attempting AROM. edema LUE Coordination: decreased gross motor;decreased fine motor    Lower Extremity Assessment Lower  Extremity Assessment: RLE deficits/detail;LLE deficits/detail RLE Deficits / Details: AROM WFL, strength hip flexion 3+/5, knee extension 4+/5, ankle DF 4/5 RLE Sensation: history of peripheral neuropathy (sons state he has bad feet, wears diabetic shoes) LLE Deficits / Details: AAROM WFL, strength hip flexion 3/5, knee extension 3-/5 (attentional?),  ankle DF 3/5 LLE Sensation: history of peripheral neuropathy (sons state he has bad feet, wears diabetic shoes)    Cervical / Trunk  Assessment Cervical / Trunk Assessment: Other exceptions Cervical / Trunk Exceptions: Increased body habitus  Communication   Communication: Expressive difficulties (mumbling)  Cognition Arousal/Alertness: Awake/alert Behavior During Therapy: Flat affect (sons report pt restless earlier) Overall Cognitive Status: Difficult to assess                                 General Comments: follows one step commands with increased time, at times slow to respond and shaking with far off gaze (RN aware)      General Comments General comments (skin integrity, edema, etc.): VSS on 15L via NRB. Sons present throughout session. Also noting "twitching" intermittently with extension of BUEs and slight R gaze. Notified RN    Exercises General Exercises - Upper Extremity Shoulder Flexion: AAROM;Both;15 reps;Supine Shoulder Extension: AAROM;Both;15 reps;Supine Elbow Flexion: AAROM;Both;15 reps;Supine Elbow Extension: AAROM;Both;15 reps;Supine   Assessment/Plan    PT Assessment Patient needs continued PT services  PT Problem List Decreased strength;Decreased mobility;Decreased balance;Decreased cognition;Decreased activity tolerance;Decreased knowledge of precautions       PT Treatment Interventions DME instruction;Therapeutic activities;Cognitive remediation;Therapeutic exercise;Patient/family education;Balance training;Functional mobility training;Gait training;Neuromuscular re-education    PT Goals (Current goals can be found in the Care Plan section)  Acute Rehab PT Goals Patient Stated Goal: Agreeable to rehab PT Goal Formulation: With family Time For Goal Achievement: 03/22/21 Potential to Achieve Goals: Fair    Frequency Min 4X/week   Barriers to discharge        Co-evaluation PT/OT/SLP Co-Evaluation/Treatment: Yes Reason for Co-Treatment: For patient/therapist safety;Complexity of the patient's impairments (multi-system involvement);To address functional/ADL  transfers PT goals addressed during session: Mobility/safety with mobility;Strengthening/ROM OT goals addressed during session: ADL's and self-care       AM-PAC PT "6 Clicks" Mobility  Outcome Measure Help needed turning from your back to your side while in a flat bed without using bedrails?: Total Help needed moving from lying on your back to sitting on the side of a flat bed without using bedrails?: Total Help needed moving to and from a bed to a chair (including a wheelchair)?: Total Help needed standing up from a chair using your arms (e.g., wheelchair or bedside chair)?: Total Help needed to walk in hospital room?: Total Help needed climbing 3-5 steps with a railing? : Total 6 Click Score: 6    End of Session Equipment Utilized During Treatment: Gait belt Activity Tolerance: Treatment limited secondary to medical complications (Comment) (MD deferred EOB/OOB) Patient left: in bed;with restraints reapplied;with family/visitor present Nurse Communication: Mobility status PT Visit Diagnosis: Other abnormalities of gait and mobility (R26.89);Muscle weakness (generalized) (M62.81);Other symptoms and signs involving the nervous system (R29.898)    Time: 8546-2703 PT Time Calculation (min) (ACUTE ONLY): 21 min   Charges:   PT Evaluation $PT Eval High Complexity: 1 High          Sheran Lawless, PT Acute Rehabilitation Services Pager:[phone] Office:[phone] 03/08/2021   Elray Mcgregor 03/08/2021, 1:32 PM

## 2021-03-08 NOTE — Progress Notes (Signed)
SLP Cancellation Note  Patient Details Name: Connor [name] MRN: [id number] DOB: 12/07/1945   Cancelled treatment:       Reason Eval/Treat Not Completed: Medical issues which prohibited therapy;Patient's level of consciousness, per RN pt still not appropriate for clinical swallow assessment. Will continue to closely follow.    Ardyth Gal MA, CCC-SLP Acute Rehabilitation Services   03/08/2021, 2:05 PM

## 2021-03-08 NOTE — Progress Notes (Signed)
Inpatient Rehab Admissions Coordinator:   Pt was screened for CIR candidacy based on recommendations from PT/OT.  Note limited eval.  I will rescreen again tomorrow to see if he's able to better participate.  No consult at this time.   Estill Dooms, PT, DPT Admissions Coordinator [phone] 03/08/21  3:51 PM

## 2021-03-08 NOTE — Progress Notes (Signed)
NAMELior [name], MRN:  [id number], DOB:  26-Dec-1945, LOS: 3 ADMISSION DATE:  03/12/2021, CONSULTATION DATE:  03/05/2021 REFERRING MD:  Thomasena Edis CHIEF COMPLAINT:  AMS   History of Present Illness:  Connor [name] is a 75 y.o. male who has a PMH as outlined below who presented to [facility] ED 9/19 as code stroke.  He woke up his normal self that morning but then had acute onset of severe headache. He called EMS and upon their arrival, he had AMS and left facial droop.  SBP was noted to be 270.  He was brought to ED where CT head demonstrated SAH.  He was intubated for airway protection and was started on Cleviprex for BP control with goal SBP 120 - 140.  Significant Hospital Events: Including procedures, antibiotic start and stop dates in addition to other pertinent events   9/19 > admit[facility] > tolerated spontaneous breathing trial, extubated  Interim History / Subjective:  Patient remained agitated postextubation, he was getting out of the bed, high risk of fall.  He started on low-dose Precedex Still mental status is not clear, remains encephalopathic  Objective:  Blood pressure (!) 214/199, pulse (!) 115, temperature 100 F (37.8 C), temperature source Axillary, resp. rate (!) 32, height 5\' 10"  (1.778 m), weight 129.4 kg, SpO2 91 %.    FiO2 (%):  [35 %] 35 %   Intake/Output Summary (Last 24 hours) at 03/08/2021 1001 Last data filed at 03/08/2021 0700 Gross per 24 hour  Intake 1326.92 ml  Output 4125 ml  Net -2798.08 ml   Filed Weights   03/06/2021 0856 02/27/2021 1145 03/08/21 0500  Weight: 132.9 kg 130.4 kg 129.4 kg    Examination:   Physical exam: General: Crtitically ill-appearing morbidly obese African-American male, lying on the bed, agitated HEENT: Bonnie/AT, eyes anicteric.  Moist mucous membranes Neuro: Lethargic, opens eyes with vocal stimuli, moving all 4 extremities spontaneously, not following commands Chest: Coarse breath sounds, no  wheezes or rhonchi Heart: Regular rate and rhythm, no murmurs or gallops Abdomen: Soft, nontender, nondistended, bowel sounds present Skin: No rash  Resolved Hospital Problem list:    Assessment & Plan:  H/H 2, MF 4 subarachnoid hemorrhage in the setting of hypertensive emergency Acute hypertensive encephalopathy Multifocal intraparenchymal hemorrhage related to uncontrolled hypertension Induced hypernatremia Continue neuro watch every hour SBP goal 120 - 140 CTA head and neck and MRA negative for aneurysm Continue empiric Keppra, Nimodipine for now. Appreciate neurosurgical input, recommend no acute intervention Patient may have repeat CTA versus DSA in few days Blood pressure is not well controlled Still requiring high-dose clevidipine infusion Unfortunately patient i altered mental status, unable to perform swallow evaluation Will try to place NG tube, then restart oral antihypertensive with intention to titrate off clevidipine Continue as needed labetalol Minimize sedation Continue low-dose Precedex Serum sodium is slowly trending down, remain off 3% saline Closely monitor  Acute hypoxic respiratory failure with possible aspiration pneumonia Patient white count trended up He spiked fever yesterday with thick tan secretions Respiratory culture was sent is growing polymicrobial's Change IV ceftriaxone to Unasyn  AKI, likely due to dehydration and hypertensive kidney injury Serum creatinine trended down to 1.3 from 1.5 yesterday Closely monitor Avoid nephrotoxic agent  Diabetes type 2 with hyperglycemia Still remain hyperglycemic Increase Levemir, continue sliding scale insulin CBG goal 140-180  Morbid obesity Nutritionist follow-up  Best practice (evaluated daily):  Diet/type: NPO SLP evaluation when able DVT prophylaxis: SCD GI prophylaxis:  PPI Lines: N/A Foley: Discontinue, apply a condom cath Code Status:  full code Last date of multidisciplinary goals of  care discussion: 9/21: Updated patient's sons at bedside, decision was to continue full aggressive care   Total critical care time: 38 minutes  Performed by: Cheri Fowler   Critical care time was exclusive of separately billable procedures and treating other patients.   Critical care was necessary to treat or prevent imminent or life-threatening deterioration.   Critical care was time spent personally by me on the following activities: development of treatment plan with patient and/or surrogate as well as nursing, discussions with consultants, evaluation of patient's response to treatment, examination of patient, obtaining history from patient or surrogate, ordering and performing treatments and interventions, ordering and review of laboratory studies, ordering and review of radiographic studies, pulse oximetry and re-evaluation of patient's condition.   Cheri Fowler MD [facility] See Amion for pager If no response to pager, please call [phone] until 7pm After 7pm, Please call E-link [phone]

## 2021-03-08 NOTE — Progress Notes (Signed)
Pharmacy Antibiotic Note  Connor [name] is a 75 y.o. male admitted on 03/07/2021 with a SAH. He is currently afebrile but WBC is elevated (19.1). Tracheal Aspirate (9/21) is pending cultures. Currently he is being ruled out for aspiration pneumonia.  Pharmacy has been consulted for Unasyn dosing. Renal function has improved and is now back to baseline.  Plan: Start Unasyn 3g IV Q6H Monitor s/sx of infection and F/U cultures  Height: 5\' 10"  (177.8 cm) Weight: 129.4 kg (285 lb 4.4 oz) IBW/kg (Calculated) : 73  Temp (24hrs), Avg:99.8 F (37.7 C), Min:99.1 F (37.3 C), Max:100.2 F (37.9 C)  Recent Labs  Lab 03/09/2021 0810 03/15/2021 0817 03/08/2021 1129 03/06/21 0152 03/06/21 1032 03/07/21 0357 03/08/21 0151  WBC 9.8  --   --  12.0*  --  15.0* 19.1*  CREATININE 1.39*   < > 1.34* 1.48* 1.52* 1.53* 1.36*   < > = values in this interval not displayed.    Estimated Creatinine Clearance: 65.4 mL/min (A) (by C-G formula based on SCr of 1.36 mg/dL (H)).    No Known Allergies  Antimicrobials this admission: CTX  9/21>> 9/22 Unasyn 9/22>>  Microbiology results: 9/21 Trach Aspirate: Pending  9/19 MRSA PCR: Neg  Thank you for allowing pharmacy to be a part of this patient's care.  10/19, PharmD Candidate 03/08/2021 8:45 AM

## 2021-03-09 ENCOUNTER — Inpatient Hospital Stay (HOSPITAL_COMMUNITY): Payer: Medicare HMO

## 2021-03-09 DIAGNOSIS — I609 Nontraumatic subarachnoid hemorrhage, unspecified: Secondary | ICD-10-CM | POA: Diagnosis not present

## 2021-03-09 DIAGNOSIS — J9601 Acute respiratory failure with hypoxia: Secondary | ICD-10-CM | POA: Diagnosis not present

## 2021-03-09 DIAGNOSIS — I161 Hypertensive emergency: Secondary | ICD-10-CM | POA: Diagnosis not present

## 2021-03-09 DIAGNOSIS — G9340 Encephalopathy, unspecified: Secondary | ICD-10-CM | POA: Diagnosis not present

## 2021-03-09 LAB — GLUCOSE, CAPILLARY
Glucose-Capillary: 312 mg/dL — ABNORMAL HIGH (ref 70–99)
Glucose-Capillary: 322 mg/dL — ABNORMAL HIGH (ref 70–99)
Glucose-Capillary: 343 mg/dL — ABNORMAL HIGH (ref 70–99)
Glucose-Capillary: 346 mg/dL — ABNORMAL HIGH (ref 70–99)
Glucose-Capillary: 364 mg/dL — ABNORMAL HIGH (ref 70–99)
Glucose-Capillary: 423 mg/dL — ABNORMAL HIGH (ref 70–99)

## 2021-03-09 LAB — CBC
HCT: 50.8 % (ref 39.0–52.0)
Hemoglobin: 17.1 g/dL — ABNORMAL HIGH (ref 13.0–17.0)
MCH: 32.6 pg (ref 26.0–34.0)
MCHC: 33.7 g/dL (ref 30.0–36.0)
MCV: 96.9 fL (ref 80.0–100.0)
Platelets: 102 10*3/uL — ABNORMAL LOW (ref 150–400)
RBC: 5.24 MIL/uL (ref 4.22–5.81)
RDW: 14.2 % (ref 11.5–15.5)
WBC: 15.2 10*3/uL — ABNORMAL HIGH (ref 4.0–10.5)
nRBC: 0 % (ref 0.0–0.2)

## 2021-03-09 LAB — TRIGLYCERIDES: Triglycerides: 283 mg/dL — ABNORMAL HIGH (ref <150)

## 2021-03-09 IMAGING — DX DG ABD PORTABLE 1V
1 series · 1 of 1 positions shown · non-contrast
Comparison: [DATE]

CLINICAL DATA: Enteric tube placement

EXAM:
PORTABLE ABDOMEN - 1 VIEW

[abdomen kub]
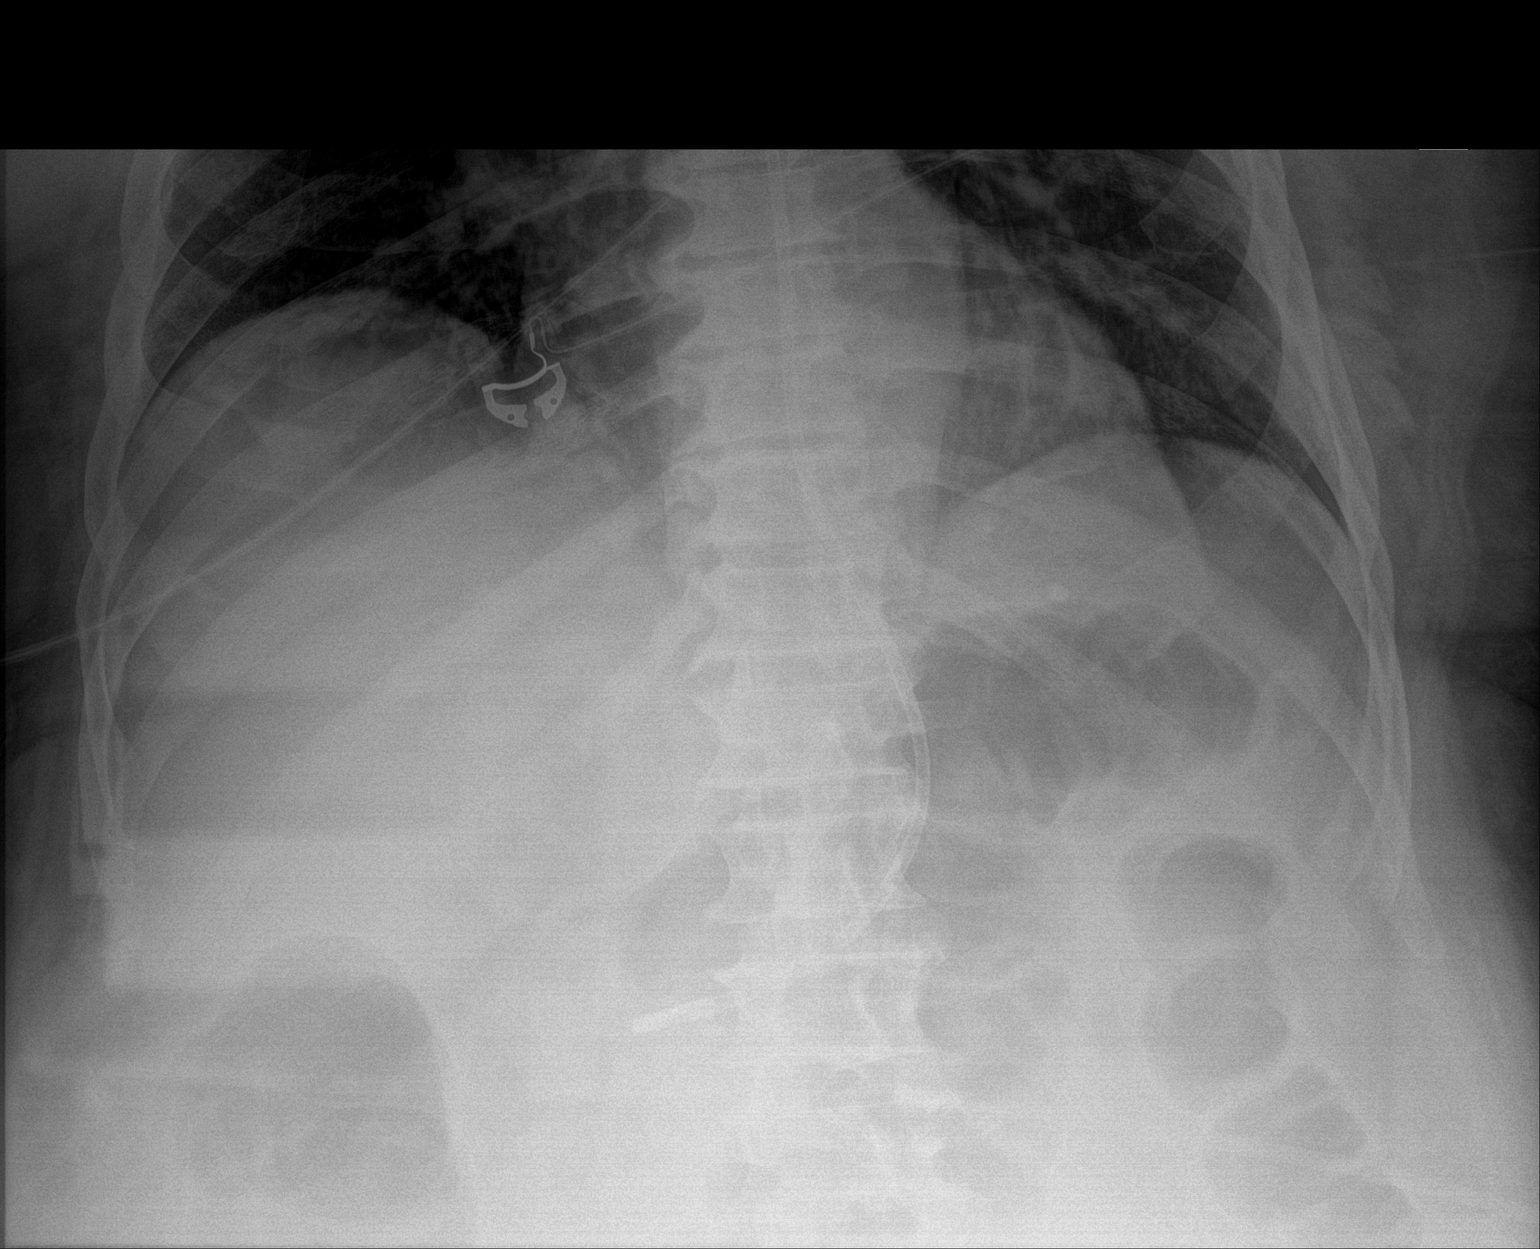

[1 of 1 positions shown; findings below may reference images not displayed]

FINDINGS: Incomplete assessment of the pelvis. Enteric tube tip terminates
over the region of the distal stomach. Gaseous distension of loops
of large bowel. Scattered bibasilar opacities, likely atelectasis
IMPRESSION: Enteric tube tip terminates over the distal stomach.

## 2021-03-09 MED ORDER — INSULIN DETEMIR 100 UNIT/ML ~~LOC~~ SOLN
30.0000 [IU] | Freq: Two times a day (BID) | SUBCUTANEOUS | Status: DC
  Administered 2021-03-09: 30 [IU] via SUBCUTANEOUS
  Filled 2021-03-09 (×3): qty 0.3

## 2021-03-09 MED ORDER — OSMOLITE 1.5 CAL PO LIQD
1000.0000 mL | ORAL | Status: DC
  Administered 2021-03-09 (×2): 1000 mL

## 2021-03-09 MED ORDER — FREE WATER
200.0000 mL | Freq: Four times a day (QID) | Status: DC
  Administered 2021-03-09 – 2021-03-13 (×15): 200 mL

## 2021-03-09 MED ORDER — INSULIN DETEMIR 100 UNIT/ML ~~LOC~~ SOLN
15.0000 [IU] | Freq: Once | SUBCUTANEOUS | Status: AC
  Administered 2021-03-09: 15 [IU] via SUBCUTANEOUS
  Filled 2021-03-09: qty 0.15

## 2021-03-09 MED ORDER — PROSOURCE TF PO LIQD
90.0000 mL | Freq: Two times a day (BID) | ORAL | Status: DC
  Administered 2021-03-09 – 2021-03-13 (×8): 90 mL
  Filled 2021-03-09 (×8): qty 90

## 2021-03-09 NOTE — Progress Notes (Signed)
Notified Dr. Merrily Pew about patient's CBG being 423. Orders for a one time dose of levemir 15 units and sliding scale 15 units given.

## 2021-03-09 NOTE — Progress Notes (Signed)
Transcranial Doppler   Date POD PCO2 HCT BP   MCA ACA PCA OPHT SIPH VERT Basilar  9/21 [facility]         Right  Left   *  *   *  *   *  *   18  18   *  *   *  *   *       9/23 [facility]          Right  Left    30   63           20   25    14  12                                  Right  Left                                                                 Right  Left                                                                 Right  Left                                                               Right  Left                                                               Right  Left                                                       MCA = Middle Cerebral Artery      OPHT = Opthalmic Artery     BASILAR = Basilar Artery   ACA = Anterior Cerebral Artery     SIPH = Carotid Siphon PCA = Posterior Cerebral Artery   VERT = Verterbral Artery                    Normal MCA = 62+\-12 ACA = 50+\-12 PCA = 42+\-23  *-unable to insonate  [place] Ratio RT 0.64 LT 2.74

## 2021-03-09 NOTE — Progress Notes (Signed)
Inpatient Diabetes Program Recommendations  AACE/ADA: New Consensus Statement on Inpatient Glycemic Control (2015)  Target Ranges:  Prepandial:   less than 140 mg/dL      Peak postprandial:   less than 180 mg/dL (1-2 hours)      Critically ill patients:  140 - 180 mg/dL   Lab Results  Component Value Date   GLUCAP 423 (H) 03/09/2021   HGBA1C 7.2 (H)     Review of Glycemic Control Results for Connor [name], VISE (MRN [id number]) as of 03/09/2021 13:10  Ref. Range 03/08/2021 19:24 03/08/2021 23:16 03/09/2021 03:19 03/09/2021 07:59 03/09/2021 11:17  Glucose-Capillary Latest Ref Range: 70 - 99 mg/dL 884 (H) 166 (H) 063 (H) 346 (H) 423 (H)   Diabetes history: DM 2 Outpatient Diabetes medications:  Farxiga 10 mg daily, Novolog 10-12 units tid with meals, Lantus 35 units q HS, Metformin 2000 mg XR daily Current orders for Inpatient glycemic control:  Levemir 30 units bid Novolog 0-15 units q 4 hours Vital 55 ml/hr  Inpatient Diabetes Program Recommendations:    Note Levemir increased to 30 units bid.   If blood sugars remain increased, consider adding Novolog tube feed coverage 3 units q 4 hours.   Thanks,  Beryl Meager, RN, BC-ADM Inpatient Diabetes Coordinator Pager [phone]  (8a-5p)

## 2021-03-09 NOTE — Progress Notes (Signed)
SLP Cancellation Note  Patient Details Name: Connor [name] MRN: [id number] DOB: 12-18-1945   Cancelled treatment:       Reason Eval/Treat Not Completed: Fatigue/lethargy limiting ability to participate;Medical issues which prohibited therapy. RN reports tenuous respiratory status and lethargy this date. SLP will continue efforts.     Avie Echevaria, MA, CCC-SLP Acute Rehabilitation Services Office Number: [phone]  Paulette Blanch 03/09/2021, 9:00 AM

## 2021-03-09 NOTE — Progress Notes (Signed)
Lab has drawn a BMP 3x today and all samples have hemolyzed. Dr. Merrily Pew is aware and recommends to hold off today and redraw tomorrow.

## 2021-03-09 NOTE — Procedures (Signed)
Cortrak  Person Inserting Tube:  Sriyan Cutting, RD Tube Type:  Cortrak - 43 inches Tube Size:  10 Tube Location:  Left nare Initial Placement:  Stomach Secured by: Bridle Technique Used to Measure Tube Placement:  Marking at nare/corner of mouth Cortrak Secured At:  64 cm   Cortrak Tube Team Note:  Consult received to place a Cortrak feeding tube.   X-ray is required, abdominal x-ray has been ordered by the Cortrak team. Please confirm tube placement before using the Cortrak tube.   If the tube becomes dislodged please keep the tube and contact the Cortrak team at [email or web address] (password [email or web address]) for replacement.  If after hours and replacement cannot be delayed, place a NG tube and confirm placement with an abdominal x-ray.     Alexzia Kasler MS, RDN, LDN, CNSC Registered Dietitian III Clinical Nutrition RD Pager and On-Call Pager Number Located in Amion   

## 2021-03-09 NOTE — Progress Notes (Signed)
Nutrition Follow-up  DOCUMENTATION CODES:   Obesity unspecified  INTERVENTION:   Initiate tube feeding via Cortrak: Osmolite 1.5 at 50 ml/h (1200 ml per day) Prosource TF 90 ml BID  Provides 1960 kcal, 119 gm protein, 910 ml free water daily   200 ml free water every 6 hours Total free water: 1710 ml   Cleviprex adding additional kcal from lipid   NUTRITION DIAGNOSIS:   Inadequate oral intake related to inability to eat as evidenced by NPO status. Ongoing.   GOAL:   Patient will meet greater than or equal to 90% of their needs Met with TF  MONITOR:   TF tolerance, Vent status  REASON FOR ASSESSMENT:   Consult, Ventilator Enteral/tube feeding initiation and management  ASSESSMENT:   Pt with PMH of DM and HTN admitted with extensive SAH with IVH per MD likely due to chronic HTN.   Glucose elevated; insulin being adjusted.    9/21 extubated 9/23 cortrak placed; tip gastric   Medications reviewed and include: SSI, 30 units leveminr BID Precedex  Cleviprex @ 38 ml/hr provides: 1824 kcal from lipid   Labs reviewed: Na 150 CBG: 258-423      Diet Order:   Diet Order             Diet NPO time specified  Diet effective now                   EDUCATION NEEDS:   Not appropriate for education at this time  Skin:  Skin Assessment: Reviewed RN Assessment  Last BM:  9/23  Height:   Ht Readings from Last 1 Encounters:  02/20/2021 '5\' 10"'  (1.778 m)    Weight:   Wt Readings from Last 1 Encounters:  03/09/21 130.1 kg    BMI:  Body mass index is 41.15 kg/m.  Estimated Nutritional Needs:   Kcal:  1800  Protein:  >150 grams  Fluid:  > 1.8 L/day  Lockie Pares., RD, LDN, CNSC See AMiON for contact information

## 2021-03-09 NOTE — Progress Notes (Signed)
Physical Therapy Treatment Patient Details Name: Connor [name] MRN: [id number] DOB: 11-Feb-1946 Today's Date: 03/09/2021   History of Present Illness 75 yo male presenting to ED via EMS on 9/19 with HA and L side facial droop. MRI showing SAH scattered throughout brain, most pronounced at the basilar cisterns as well as acute intraparenchymal hemorrhage at ventral R thalamus and L thalamus, midbrain, and pons. Intubated 9/19-9/21. PMH including  uncontrolled HTN, HLD, and IDDM.    PT Comments    Session limited by lethargy. Patient on 15L O2 on Venturi Mask. Placed patient in egress position to stimulate arousal. Patient not responding to noxious stimuli initially. Patient with one instance of eye opening and able to follow simple commands of "squeeze my hand", "look at me", and "wiggle your toes". Patient quickly returned to eyes closed and not responding to auditory or noxious stimuli. Continue to recommend comprehensive inpatient rehab (CIR) for post-acute therapy needs.     Recommendations for follow up therapy are one component of a multi-disciplinary discharge planning process, led by the attending physician.  Recommendations may be updated based on patient status, additional functional criteria and insurance authorization.  Follow Up Recommendations  CIR     Equipment Recommendations  Other (comment) (TBD)    Recommendations for Other Services       Precautions / Restrictions Precautions Precautions: Fall;Other (comment) Precaution Comments: watch SpO2, 15L O2 Venturi mask, restraints Restrictions Weight Bearing Restrictions: No     Mobility  Bed Mobility Overal bed mobility: Needs Assistance             General bed mobility comments: placed in egress position to stimulate arousal. TotalA+2 to bring patient's back away from bed with patient opening eyes.    Transfers                    Ambulation/Gait                 Stairs              Wheelchair Mobility    Modified Rankin (Stroke Patients Only) Modified Rankin (Stroke Patients Only) Pre-Morbid Rankin Score: No significant disability Modified Rankin: Severe disability     Balance                                            Cognition Arousal/Alertness: Lethargic Behavior During Therapy: Flat affect Overall Cognitive Status: Difficult to assess                                 General Comments: lethargic. No reaction to noxious stimuli initially. Reacting to trap squeeze on R. One instance of alertness and able to follow one step commands of "squeeze my fingers", "look at me", and "wiggle toes", however quickly returns to sleep.      Exercises      General Comments General comments (skin integrity, edema, etc.): VSS on 15L on vernturi mask set at 35%      Pertinent Vitals/Pain Pain Assessment: Faces Faces Pain Scale: No hurt Pain Intervention(s): Monitored during session    Home Living                      Prior Function            PT Goals (current goals  can now be found in the care plan section) Acute Rehab PT Goals Patient Stated Goal: did not state PT Goal Formulation: With family Time For Goal Achievement: 03/22/21 Potential to Achieve Goals: Fair Progress towards PT goals: Progressing toward goals    Frequency    Min 4X/week      PT Plan Current plan remains appropriate    Co-evaluation              AM-PAC PT "6 Clicks" Mobility   Outcome Measure  Help needed turning from your back to your side while in a flat bed without using bedrails?: Total Help needed moving from lying on your back to sitting on the side of a flat bed without using bedrails?: Total Help needed moving to and from a bed to a chair (including a wheelchair)?: Total Help needed standing up from a chair using your arms (e.g., wheelchair or bedside chair)?: Total Help needed to walk in hospital room?:  Total Help needed climbing 3-5 steps with a railing? : Total 6 Click Score: 6    End of Session   Activity Tolerance: Patient limited by lethargy Patient left: in bed;with bed alarm set;with restraints reapplied Nurse Communication: Mobility status PT Visit Diagnosis: Other abnormalities of gait and mobility (R26.89);Muscle weakness (generalized) (M62.81);Other symptoms and signs involving the nervous system (U31.497)     Time: 0263-7858 PT Time Calculation (min) (ACUTE ONLY): 20 min  Charges:  $Therapeutic Activity: 8-22 mins                     Lidwina Kaner A. Dan Humphreys PT, DPT Acute Rehabilitation Services Pager [phone] Office [phone]    Viviann Spare 03/09/2021, 10:23 AM

## 2021-03-09 NOTE — Progress Notes (Signed)
[name], MRN:  [id number], DOB:  February 06, 1946, LOS: 4 ADMISSION DATE:  03/07/2021, CONSULTATION DATE:  02/24/2021 REFERRING MD:  Thomasena Edis CHIEF COMPLAINT:  AMS   History of Present Illness:  Connor [name] is a 75 y.o. male who has a PMH as outlined below who presented to [facility] ED 9/19 as code stroke.  He woke up his normal self that morning but then had acute onset of severe headache. He called EMS and upon their arrival, he had AMS and left facial droop.  SBP was noted to be 270.  He was brought to ED where CT head demonstrated SAH.  He was intubated for airway protection and was started on Cleviprex for BP control with goal SBP 120 - 140.  Significant Hospital Events: Including procedures, antibiotic start and stop dates in addition to other pertinent events   9/19 > admit. 9/20 > transferred to [facility] service 9/21 > tolerated spontaneous breathing trial, extubated 9/22 > remain agitated requiring Precedex infusion  Interim History / Subjective:  Patient remained encephalopathic and agitated, on and off requiring Precedex infusion Still on clevidipine infusion, NG tube was placed yesterday, currently getting oral antihypertensive meds  Objective:  Blood pressure (!) 139/56, pulse 67, temperature 100.1 F (37.8 C), temperature source Axillary, resp. rate (!) 26, height 5\' 10"  (1.778 m), weight 130.1 kg, SpO2 95 %.        Intake/Output Summary (Last 24 hours) at 03/09/2021 1129 Last data filed at 03/09/2021 1000 Gross per 24 hour  Intake 2977.75 ml  Output 2225 ml  Net 752.75 ml   Filed Weights   03/07/2021 1145 03/08/21 0500 03/09/21 0419  Weight: 130.4 kg 129.4 kg 130.1 kg    Examination:   Physical exam: General: Crtitically ill-appearing morbidly obese African-American male, lying on the bed, agitated HEENT: Connor [name]/AT, eyes anicteric.  Moist mucous membranes Neuro: Lethargic, opens eyes with vocal stimuli, follows simple commands, moving all 4 extremities Chest: Coarse  breath sounds, no wheezes or rhonchi Heart: Regular rate and rhythm, no murmurs or gallops Abdomen: Soft, nontender, nondistended, bowel sounds present Skin: No rash  Resolved Hospital Problem list:    Assessment & Plan:  H/H 2, MF 4 subarachnoid hemorrhage in the setting of hypertensive emergency Acute hypertensive encephalopathy Multifocal intraparenchymal hemorrhage related to uncontrolled hypertension Induced hypernatremia Continue neuro watch every hour SBP goal 120 - 140 CTA head and neck and MRA negative for aneurysm Continue empiric Keppra, Nimodipine for now Patient may have repeat CTA versus DSA in few days Blood pressure is still not well controlled Continue to require high-dose clevidipine infusion NG tube was placed yesterday, continue oral antihypertensive meds and try to taper off clevidipine infusion Will place cortrak today Continue as needed labetalol Minimize sedation Avoid sedation if possible Started on free water flushes Closely monitor  Acute hypoxic respiratory failure with possible aspiration pneumonia T-max 101.3 last 24 hours Respiratory secretions are improving Respiratory culture was sent is growing polymicrobial's with strep pneumo/ Change Unasyn  AKI, likely due to dehydration and hypertensive kidney injury Serum creatinine trended down to 1.3 from 1.5 yesterday Closely monitor Avoid nephrotoxic agent  Diabetes type 2 with hyperglycemia Patient is hyperglycemic with fingerstick ranging in 300s Increase Levemir to 30 units twice daily, continue sliding scale insulin CBG goal 140-180  Morbid obesity Nutritionist follow-up  Best practice (evaluated daily):  Diet/type: NPO start tube feeds DVT prophylaxis: SCD GI prophylaxis: PPI Lines: N/A Foley: Apply condom cath Code Status:  full code Last date of  multidisciplinary goals of care discussion: 9/21: Updated patient's sons at bedside, decision was to continue full aggressive  care   Total critical care time: 35 minutes  Performed by: Cheri Fowler   Critical care time was exclusive of separately billable procedures and treating other patients.   Critical care was necessary to treat or prevent imminent or life-threatening deterioration.   Critical care was time spent personally by me on the following activities: development of treatment plan with patient and/or surrogate as well as nursing, discussions with consultants, evaluation of patient's response to treatment, examination of patient, obtaining history from patient or surrogate, ordering and performing treatments and interventions, ordering and review of laboratory studies, ordering and review of radiographic studies, pulse oximetry and re-evaluation of patient's condition.   Cheri Fowler MD [facility] See Amion for pager If no response to pager, please call [phone] until 7pm After 7pm, Please call E-link [phone]

## 2021-03-10 ENCOUNTER — Inpatient Hospital Stay (HOSPITAL_COMMUNITY): Payer: Medicare HMO

## 2021-03-10 DIAGNOSIS — I161 Hypertensive emergency: Secondary | ICD-10-CM | POA: Diagnosis not present

## 2021-03-10 DIAGNOSIS — J9601 Acute respiratory failure with hypoxia: Secondary | ICD-10-CM | POA: Diagnosis not present

## 2021-03-10 DIAGNOSIS — I609 Nontraumatic subarachnoid hemorrhage, unspecified: Secondary | ICD-10-CM | POA: Diagnosis not present

## 2021-03-10 LAB — CULTURE, RESPIRATORY W GRAM STAIN

## 2021-03-10 LAB — POCT I-STAT 7, (LYTES, BLD GAS, ICA,H+H)
Acid-base deficit: 7 mmol/L — ABNORMAL HIGH (ref 0.0–2.0)
Acid-base deficit: 9 mmol/L — ABNORMAL HIGH (ref 0.0–2.0)
Acid-base deficit: 8 mmol/L — ABNORMAL HIGH (ref 0.0–2.0)
Bicarbonate: 20.6 mmol/L (ref 20.0–28.0)
Bicarbonate: 20.2 mmol/L (ref 20.0–28.0)
Bicarbonate: 18.5 mmol/L — ABNORMAL LOW (ref 20.0–28.0)
Calcium, Ion: 1.36 mmol/L (ref 1.15–1.40)
Calcium, Ion: 1.36 mmol/L (ref 1.15–1.40)
Calcium, Ion: 1.33 mmol/L (ref 1.15–1.40)
HCT: 48 % (ref 39.0–52.0)
HCT: 48 % (ref 39.0–52.0)
HCT: 45 % (ref 39.0–52.0)
Hemoglobin: 16.3 g/dL (ref 13.0–17.0)
Hemoglobin: 16.3 g/dL (ref 13.0–17.0)
Hemoglobin: 15.3 g/dL (ref 13.0–17.0)
O2 Saturation: 99 %
O2 Saturation: 95 %
O2 Saturation: 93 %
Patient temperature: 100.9
Patient temperature: 100.4
Patient temperature: 98.6
Potassium: 3.5 mmol/L (ref 3.5–5.1)
Potassium: 3.5 mmol/L (ref 3.5–5.1)
Potassium: 3.4 mmol/L — ABNORMAL LOW (ref 3.5–5.1)
Sodium: 154 mmol/L — ABNORMAL HIGH (ref 135–145)
Sodium: 157 mmol/L — ABNORMAL HIGH (ref 135–145)
Sodium: 156 mmol/L — ABNORMAL HIGH (ref 135–145)
TCO2: 22 mmol/L (ref 22–32)
TCO2: 22 mmol/L (ref 22–32)
TCO2: 20 mmol/L — ABNORMAL LOW (ref 22–32)
pCO2 arterial: 51.7 mmHg — ABNORMAL HIGH (ref 32.0–48.0)
pCO2 arterial: 57.8 mmHg — ABNORMAL HIGH (ref 32.0–48.0)
pCO2 arterial: 40.3 mmHg (ref 32.0–48.0)
pH, Arterial: 7.215 — ABNORMAL LOW (ref 7.350–7.450)
pH, Arterial: 7.158 — CL (ref 7.350–7.450)
pH, Arterial: 7.27 — ABNORMAL LOW (ref 7.350–7.450)
pO2, Arterial: 157 mmHg — ABNORMAL HIGH (ref 83.0–108.0)
pO2, Arterial: 102 mmHg (ref 83.0–108.0)
pO2, Arterial: 77 mmHg — ABNORMAL LOW (ref 83.0–108.0)

## 2021-03-10 LAB — GLUCOSE, CAPILLARY
Glucose-Capillary: 356 mg/dL — ABNORMAL HIGH (ref 70–99)
Glucose-Capillary: 376 mg/dL — ABNORMAL HIGH (ref 70–99)
Glucose-Capillary: 363 mg/dL — ABNORMAL HIGH (ref 70–99)
Glucose-Capillary: 332 mg/dL — ABNORMAL HIGH (ref 70–99)
Glucose-Capillary: 309 mg/dL — ABNORMAL HIGH (ref 70–99)
Glucose-Capillary: 257 mg/dL — ABNORMAL HIGH (ref 70–99)
Glucose-Capillary: 268 mg/dL — ABNORMAL HIGH (ref 70–99)
Glucose-Capillary: 231 mg/dL — ABNORMAL HIGH (ref 70–99)
Glucose-Capillary: 186 mg/dL — ABNORMAL HIGH (ref 70–99)
Glucose-Capillary: 215 mg/dL — ABNORMAL HIGH (ref 70–99)
Glucose-Capillary: 193 mg/dL — ABNORMAL HIGH (ref 70–99)
Glucose-Capillary: 195 mg/dL — ABNORMAL HIGH (ref 70–99)

## 2021-03-10 LAB — CBC
HCT: 52.1 % — ABNORMAL HIGH (ref 39.0–52.0)
Hemoglobin: 17.8 g/dL — ABNORMAL HIGH (ref 13.0–17.0)
MCH: 33.8 pg (ref 26.0–34.0)
MCHC: 34.2 g/dL (ref 30.0–36.0)
MCV: 99 fL (ref 80.0–100.0)
Platelets: 125 10*3/uL — ABNORMAL LOW (ref 150–400)
RBC: 5.26 MIL/uL (ref 4.22–5.81)
RDW: 14.5 % (ref 11.5–15.5)
WBC: 17 10*3/uL — ABNORMAL HIGH (ref 4.0–10.5)
nRBC: 0 % (ref 0.0–0.2)

## 2021-03-10 IMAGING — DX DG CHEST 1V PORT
1 series · 1 of 1 positions shown · non-contrast
Comparison: [DATE].

CLINICAL DATA: Intubation.

EXAM:
PORTABLE CHEST 1 VIEW

[chest ap]
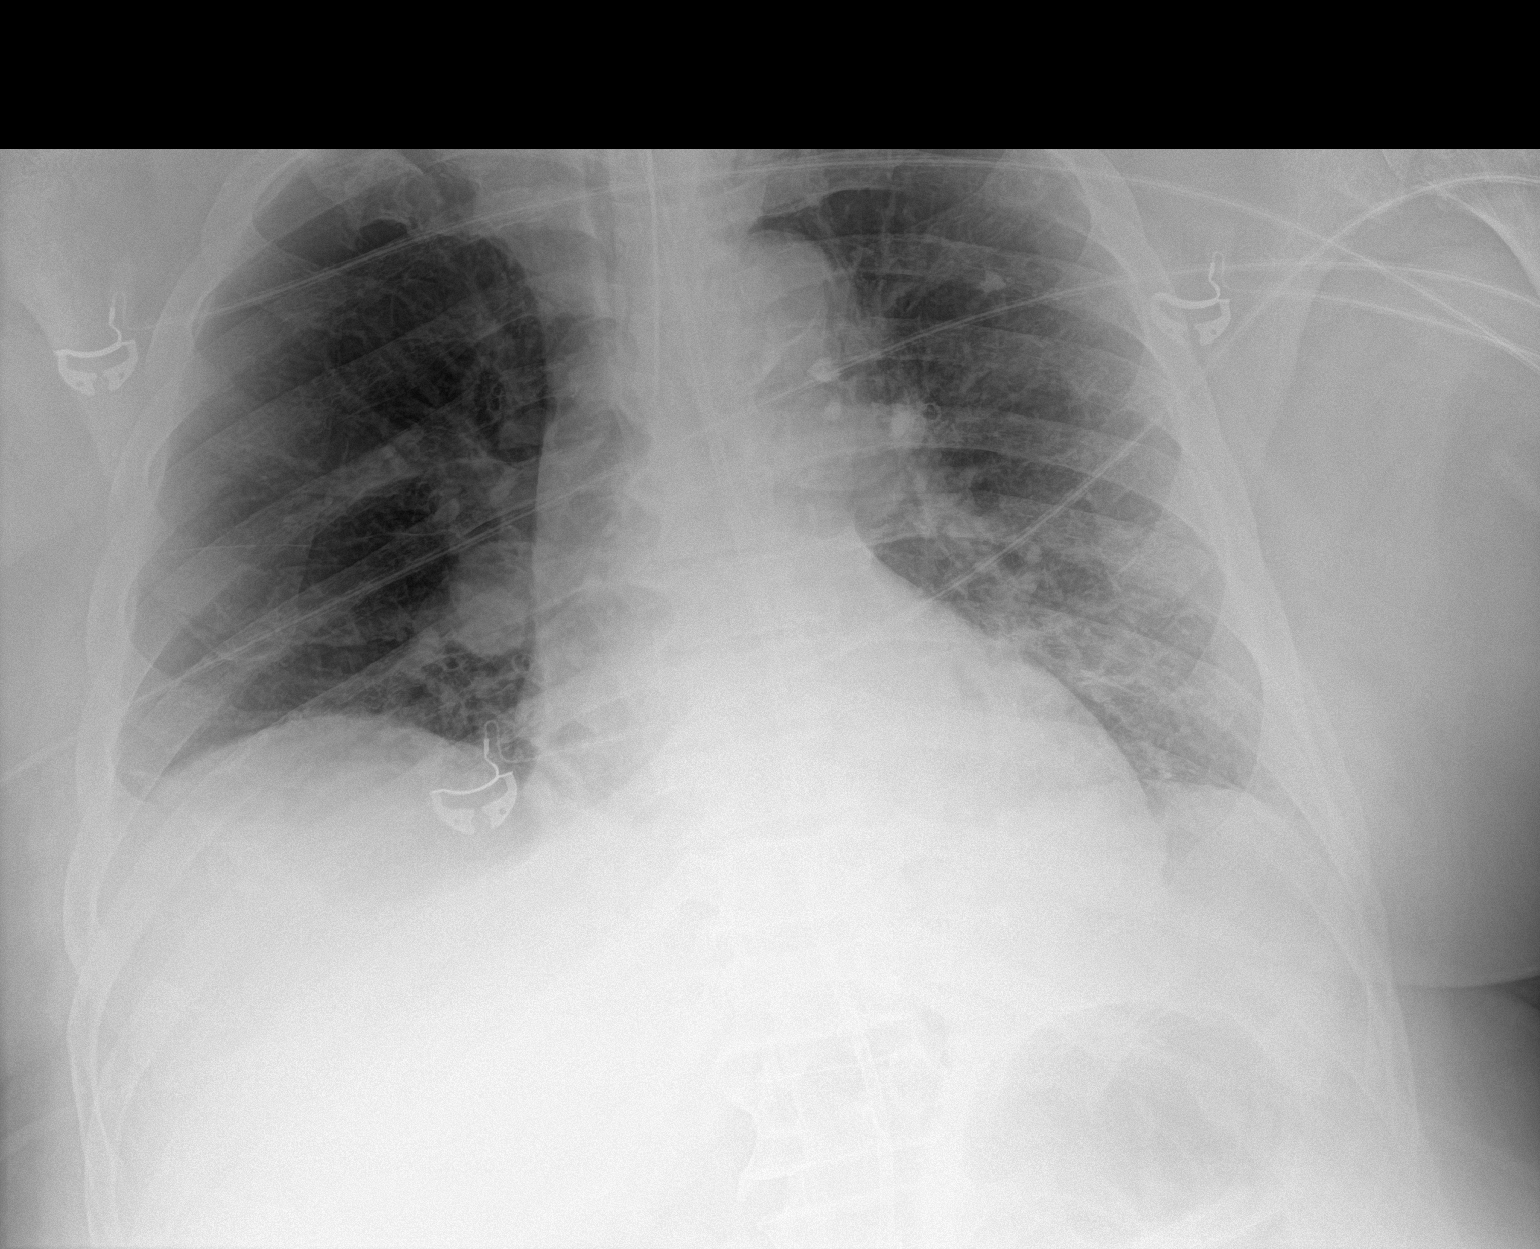

[1 of 1 positions shown; findings below may reference images not displayed]

FINDINGS: The heart size and mediastinal contours are within normal limits.
Endotracheal tube tip is approximately 1 cm above the carina. Mild
bibasilar subsegmental atelectasis is noted. The visualized skeletal
structures are unremarkable.
IMPRESSION: Endotracheal tube tip is approximately 1 cm above the carina;
withdrawal by 2-3 cm is recommended. Mild bibasilar subsegmental
atelectasis is noted.

## 2021-03-10 IMAGING — DX DG ABD PORTABLE 1V
2 series · 2 of 2 positions shown · non-contrast
Comparison: [DATE]

CLINICAL DATA: Abdominal distension

EXAM:
PORTABLE ABDOMEN - 1 VIEW

[abdomen kub (1 of 2)]
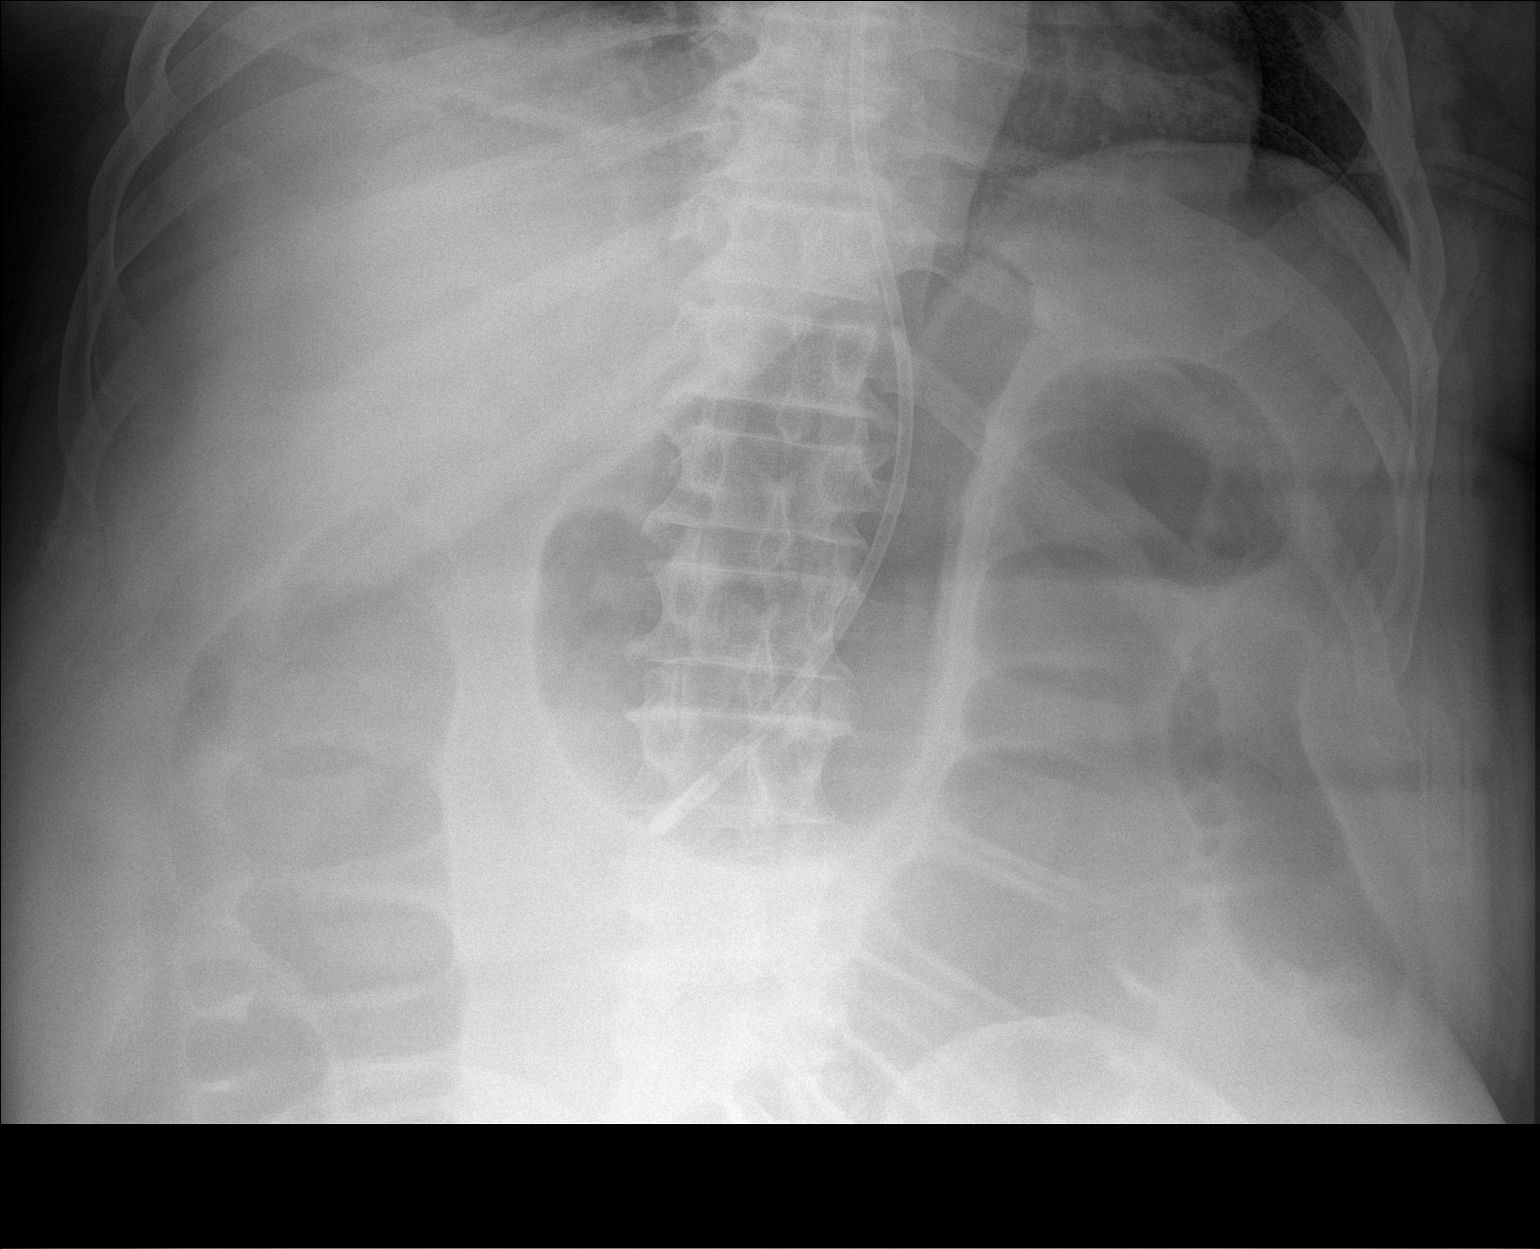

[abdomen kub (2 of 2)]
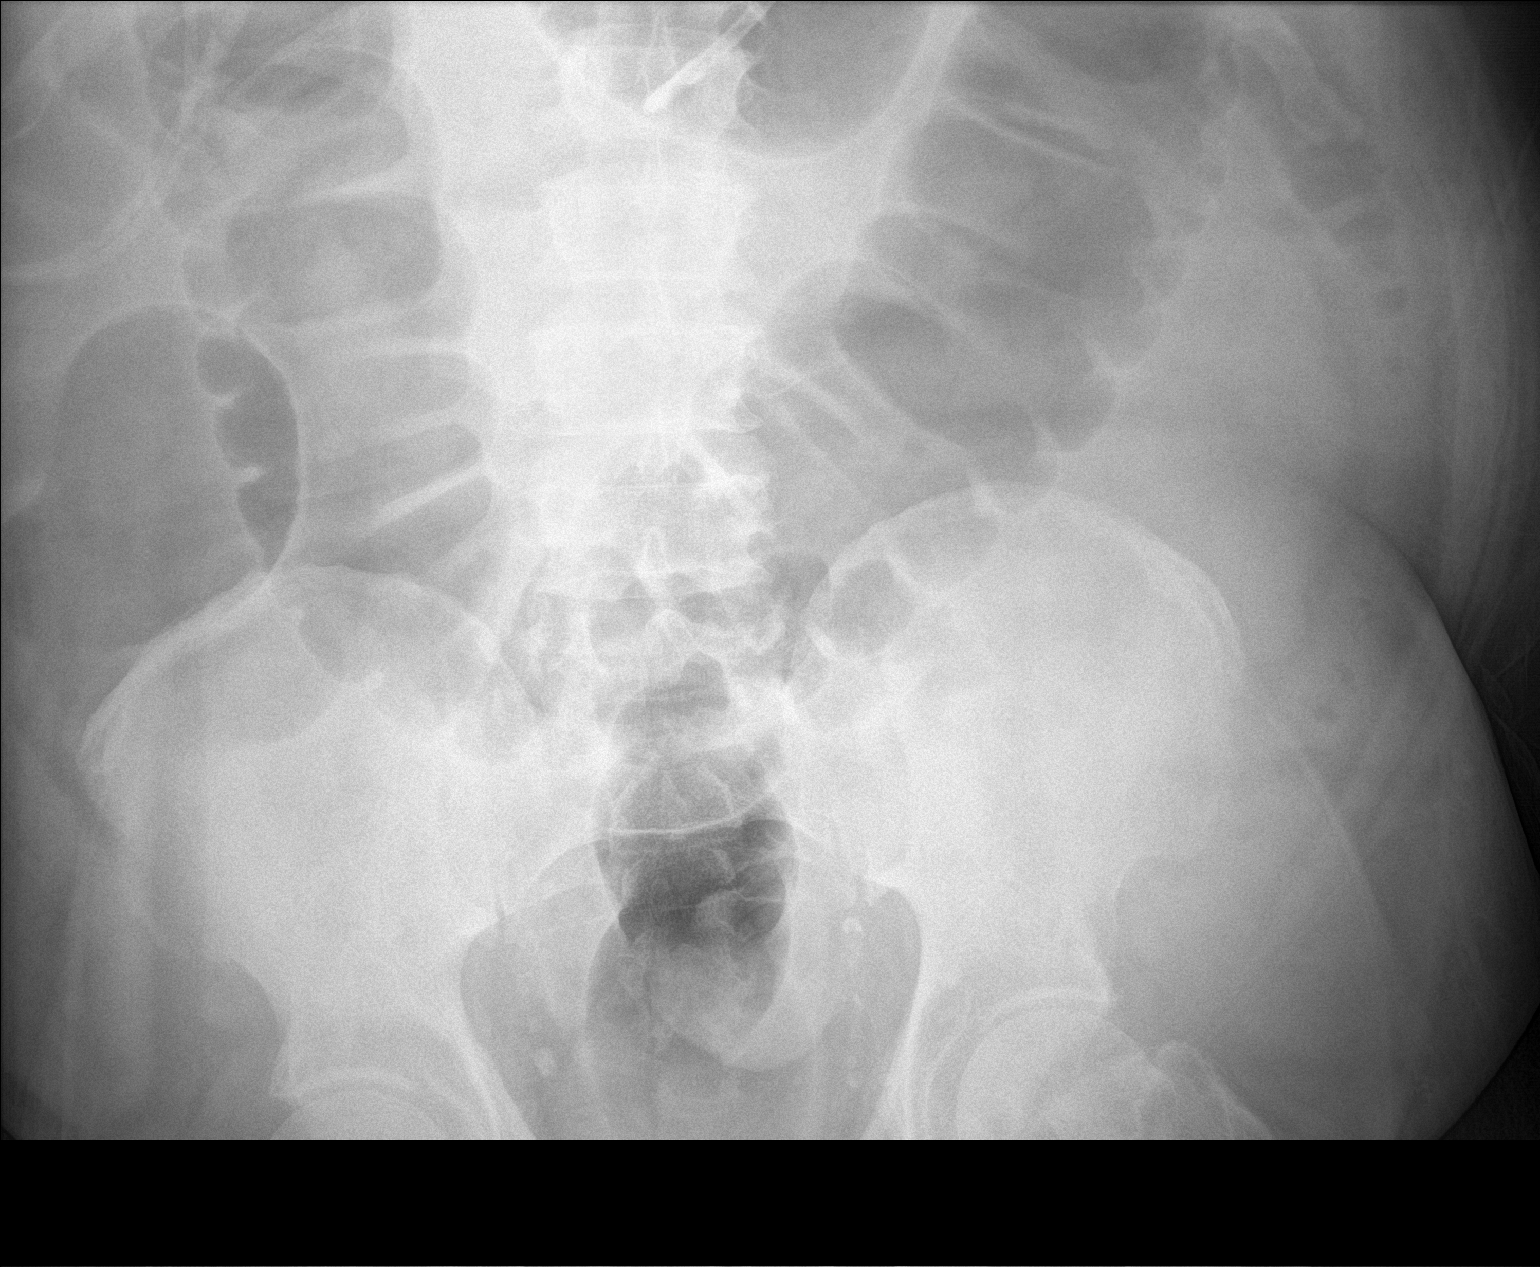

[2 of 2 positions shown; findings below may reference images not displayed]

FINDINGS: Gas distended loops of colon are again seen throughout the abdomen,
measuring up to 8.9 cm, somewhat increased prior examination. There
is scattered gas present in the distal colon the rectum. No obvious
free air on supine radiographs. Enteric feeding tube is positioned
with tip and side port below the diaphragm.
IMPRESSION: Gas distended loops of colon are again seen throughout the abdomen,
measuring up to 8.9 cm, somewhat increased compared to prior
examination. There is scattered gas present in the distal colon to
the rectum. Findings favor ileus.

## 2021-03-10 MED ORDER — ROCURONIUM BROMIDE 10 MG/ML (PF) SYRINGE
PREFILLED_SYRINGE | INTRAVENOUS | Status: AC
  Administered 2021-03-10: 140 mg via INTRAVENOUS
  Filled 2021-03-10: qty 10

## 2021-03-10 MED ORDER — FENTANYL CITRATE PF 50 MCG/ML IJ SOSY: 50.0000 ug | PREFILLED_SYRINGE | Freq: Once | INTRAMUSCULAR | Status: AC

## 2021-03-10 MED ORDER — DOCUSATE SODIUM 50 MG/5ML PO LIQD
100.0000 mg | Freq: Two times a day (BID) | ORAL | Status: DC
  Administered 2021-03-10 – 2021-03-12 (×4): 100 mg
  Filled 2021-03-10 (×4): qty 10

## 2021-03-10 MED ORDER — FENTANYL CITRATE PF 50 MCG/ML IJ SOSY
25.0000 ug | PREFILLED_SYRINGE | INTRAMUSCULAR | Status: DC | PRN
  Administered 2021-03-11 – 2021-03-12 (×3): 25 ug via INTRAVENOUS
  Filled 2021-03-10: qty 2
  Filled 2021-03-10 (×3): qty 1

## 2021-03-10 MED ORDER — DEXTROSE IN LACTATED RINGERS 5 % IV SOLN: INTRAVENOUS | Status: DC

## 2021-03-10 MED ORDER — FENTANYL CITRATE PF 50 MCG/ML IJ SOSY
25.0000 ug | PREFILLED_SYRINGE | INTRAMUSCULAR | Status: AC | PRN
  Administered 2021-03-11 (×3): 25 ug via INTRAVENOUS
  Filled 2021-03-10 (×3): qty 1

## 2021-03-10 MED ORDER — MIDAZOLAM HCL 2 MG/2ML IJ SOLN
INTRAMUSCULAR | Status: AC
  Filled 2021-03-10: qty 2

## 2021-03-10 MED ORDER — CHLORHEXIDINE GLUCONATE 0.12% ORAL RINSE (MEDLINE KIT)
15.0000 mL | Freq: Two times a day (BID) | OROMUCOSAL | Status: DC
  Administered 2021-03-10 – 2021-03-13 (×6): 15 mL via OROMUCOSAL

## 2021-03-10 MED ORDER — LOSARTAN POTASSIUM 50 MG PO TABS
100.0000 mg | ORAL_TABLET | Freq: Every day | ORAL | Status: DC
  Administered 2021-03-11: 100 mg
  Filled 2021-03-10: qty 2

## 2021-03-10 MED ORDER — ROCURONIUM BROMIDE 50 MG/5ML IV SOLN: 140.0000 mg | Freq: Once | INTRAVENOUS | Status: AC

## 2021-03-10 MED ORDER — LABETALOL HCL 5 MG/ML IV SOLN
10.0000 mg | INTRAVENOUS | Status: DC | PRN
  Administered 2021-03-11 – 2021-03-12 (×11): 10 mg via INTRAVENOUS
  Filled 2021-03-10 (×7): qty 4

## 2021-03-10 MED ORDER — SODIUM CHLORIDE 0.9 % IV SOLN: 250.0000 mL | INTRAVENOUS | Status: DC

## 2021-03-10 MED ORDER — HYDRALAZINE HCL 10 MG PO TABS: 10.0000 mg | ORAL_TABLET | Freq: Three times a day (TID) | ORAL | Status: DC

## 2021-03-10 MED ORDER — SUCCINYLCHOLINE CHLORIDE 200 MG/10ML IV SOSY
PREFILLED_SYRINGE | INTRAVENOUS | Status: AC
  Filled 2021-03-10: qty 10

## 2021-03-10 MED ORDER — ETOMIDATE 2 MG/ML IV SOLN
INTRAVENOUS | Status: AC
  Administered 2021-03-10: 20 mg via INTRAVENOUS
  Filled 2021-03-10: qty 20

## 2021-03-10 MED ORDER — DEXTROSE 50 % IV SOLN: 0.0000 mL | INTRAVENOUS | Status: DC | PRN

## 2021-03-10 MED ORDER — HYDRALAZINE HCL 25 MG PO TABS
25.0000 mg | ORAL_TABLET | Freq: Three times a day (TID) | ORAL | Status: DC
  Administered 2021-03-10 – 2021-03-13 (×8): 25 mg
  Filled 2021-03-10 (×10): qty 1

## 2021-03-10 MED ORDER — ETOMIDATE 2 MG/ML IV SOLN: 20.0000 mg | Freq: Once | INTRAVENOUS | Status: AC

## 2021-03-10 MED ORDER — INSULIN ASPART 100 UNIT/ML IJ SOLN
0.0000 [IU] | INTRAMUSCULAR | Status: DC
  Administered 2021-03-10 (×2): 20 [IU] via SUBCUTANEOUS

## 2021-03-10 MED ORDER — LACTATED RINGERS IV SOLN: INTRAVENOUS | Status: DC

## 2021-03-10 MED ORDER — POLYETHYLENE GLYCOL 3350 17 G PO PACK
17.0000 g | PACK | Freq: Every day | ORAL | Status: DC
  Administered 2021-03-10 – 2021-03-11 (×2): 17 g
  Filled 2021-03-10 (×2): qty 1

## 2021-03-10 MED ORDER — INSULIN DETEMIR 100 UNIT/ML ~~LOC~~ SOLN
40.0000 [IU] | Freq: Two times a day (BID) | SUBCUTANEOUS | Status: DC
  Administered 2021-03-10: 40 [IU] via SUBCUTANEOUS
  Filled 2021-03-10 (×2): qty 0.4

## 2021-03-10 MED ORDER — PANTOPRAZOLE SODIUM 40 MG IV SOLR
40.0000 mg | Freq: Every day | INTRAVENOUS | Status: DC
  Administered 2021-03-10 – 2021-03-13 (×4): 40 mg via INTRAVENOUS
  Filled 2021-03-10 (×4): qty 40

## 2021-03-10 MED ORDER — MINOXIDIL 2.5 MG PO TABS
2.5000 mg | ORAL_TABLET | Freq: Every day | ORAL | Status: DC
  Administered 2021-03-10: 2.5 mg
  Filled 2021-03-10 (×2): qty 1

## 2021-03-10 MED ORDER — ORAL CARE MOUTH RINSE
15.0000 mL | OROMUCOSAL | Status: DC
  Administered 2021-03-10 – 2021-03-11 (×6): 15 mL via OROMUCOSAL

## 2021-03-10 MED ORDER — NOREPINEPHRINE 4 MG/250ML-% IV SOLN
2.0000 ug/min | INTRAVENOUS | Status: DC
  Administered 2021-03-10: 2 ug/min via INTRAVENOUS
  Filled 2021-03-10: qty 250

## 2021-03-10 MED ORDER — PHENYLEPHRINE 40 MCG/ML (10ML) SYRINGE FOR IV PUSH (FOR BLOOD PRESSURE SUPPORT)
PREFILLED_SYRINGE | INTRAVENOUS | Status: AC
  Filled 2021-03-10: qty 10

## 2021-03-10 MED ORDER — FUROSEMIDE 10 MG/ML IJ SOLN
40.0000 mg | Freq: Once | INTRAMUSCULAR | Status: AC
  Administered 2021-03-10: 40 mg via INTRAVENOUS
  Filled 2021-03-10: qty 4

## 2021-03-10 MED ORDER — ROCURONIUM BROMIDE 10 MG/ML (PF) SYRINGE
PREFILLED_SYRINGE | INTRAVENOUS | Status: AC
  Filled 2021-03-10: qty 10

## 2021-03-10 MED ORDER — PROPOFOL 1000 MG/100ML IV EMUL
INTRAVENOUS | Status: AC
  Administered 2021-03-10: 5 ug/kg/min via INTRAVENOUS
  Filled 2021-03-10: qty 100

## 2021-03-10 MED ORDER — FENTANYL CITRATE PF 50 MCG/ML IJ SOSY
PREFILLED_SYRINGE | INTRAMUSCULAR | Status: AC
  Administered 2021-03-10: 50 ug via INTRAVENOUS
  Filled 2021-03-10: qty 2

## 2021-03-10 MED ORDER — PROPOFOL 1000 MG/100ML IV EMUL
5.0000 ug/kg/min | INTRAVENOUS | Status: DC
  Filled 2021-03-10: qty 100

## 2021-03-10 MED ORDER — DEXMEDETOMIDINE HCL IN NACL 400 MCG/100ML IV SOLN: 0.0000 ug/kg/h | INTRAVENOUS | Status: DC

## 2021-03-10 MED ORDER — INSULIN REGULAR(HUMAN) IN NACL 100-0.9 UT/100ML-% IV SOLN
INTRAVENOUS | Status: DC
  Administered 2021-03-10: 13 [IU]/h via INTRAVENOUS
  Administered 2021-03-10: 7.5 [IU]/h via INTRAVENOUS
  Filled 2021-03-10 (×2): qty 100

## 2021-03-10 NOTE — Procedures (Signed)
Intubation Procedure Note  Connor [name]  [id number]  1945-11-09  Date:03/10/21  Time:5:34 PM   Provider Performing:Palmer Fahrner Judie Petit Thora Lance    Procedure: Intubation (31500)  Indication(s) Respiratory Failure  Consent Risks of the procedure as well as the alternatives and risks of each were explained to the patient and/or caregiver.  Consent for the procedure was obtained and is signed in the bedside chart   Anesthesia Etomidate 20 mg  Rocuronium 140 mg    Time Out Verified patient identification, verified procedure, site/side was marked, verified correct patient position, special equipment/implants available, medications/allergies/relevant history reviewed, required imaging and test results available.   Sterile Technique Usual hand hygeine, masks, and gloves were used   Procedure Description Patient positioned in bed supine.  Sedation given as noted above.  Patient was intubated with endotracheal tube using  s4 glidescope .  View was Grade 1 full glottis .  Number of attempts was 1.  He did have thick dried secretions covering glottis which required suction prior to obtaining view. Colorimetric CO2 detector was consistent with tracheal placement.   Complications/Tolerance None; patient tolerated the procedure well. Chest X-ray is ordered to verify placement.   EBL Minimal   Specimen(s) None

## 2021-03-10 NOTE — Progress Notes (Signed)
CCM Brief Prog Note   75 yo M admitted 5 days ago with SAH. Previously required MV, was extubated 9/21. Over the past few days has had gradually worsening resp status, requiring venturi. Today, increased resp effort, progressive somnolence. Given lasix without significant improvement. CT H without worsening which is fortunate. ABG better than expected -- 7.2/51/157/22/7/20/99 Unfortunately airway protection has declined, clinically patient has reached a point where intubation is indicated.    Discussed advanced airway w pts son Kathlene November on phone, consent obtained to proceed with intubation.   Additional critical care time 37 minutes   Tessie Fass MSN, AGACNP-BC [facility] Pulmonary/Critical Care Medicine Amion for pager  03/10/2021, 5:23 PM

## 2021-03-10 NOTE — Progress Notes (Signed)
eLink Physician-Brief Progress Note Patient Name: Connor [name] DOB: [birth year] MRN: [id number]   Date of Service  03/10/2021  HPI/Events of Note  Pt recently re-intubated at 1730. Now remains hypotensive, all drips off (cleviprex and prop). MAPs 57, only PIVs. Unresponsive to painful stimuli.  Camera evaluation for low BP. Discussed with RN. MAP 63, HR 80. Sats ok on Vent. Pupil reactive. Neurology signed off.  CTH /notes/labs reviewed.    eICU Interventions  - has 3 20 G PIV. Start Levophed gtt, get a 18 G PIV if possible.      Intervention Category Major Interventions: Hypotension - evaluation and management  Ranee Gosselin 03/10/2021, 8:52 PM

## 2021-03-10 NOTE — Progress Notes (Signed)
Inpatient Rehab Admissions Coordinator:   Per therapy recommendations, patient was screened for CIR candidacy by Megan Salon, MS, CCC-SLP.  At this time, Pt. is is not participating enough with therapies. Currently total+2 and has not attempted OOB.   Pt. may have potential to progress to becoming a CIR candidate, so CIR admissions team will follow and monitor for progress and participation with therapies and place consult order if Pt. appears to be an appropriate candidate. Please contact me with any questions.   Megan Salon, MS, CCC-SLP Rehab Admissions Coordinator  [phone] (celll) [phone] (office)

## 2021-03-10 NOTE — Progress Notes (Addendum)
Soft BP following intubation Cleviprex off Prop off  Will wait and see if he equilibrates before further de-escalating antihypertensive reg (had been requiring robust enteral reg + max IV cleviprex earlier this afternoon) or augmenting with vasoactive meds.    D/w RN and with oncoming night PCCM team    SBP goal < 160  If SBP do not self equilibrate, please hold scheduled antihypertensives and notify PCCM    Tessie Fass MSN, AGACNP-BC [facility] Pulmonary/Critical Care Medicine 03/10/2021, 7:02 PM

## 2021-03-10 NOTE — Progress Notes (Addendum)
NAMEJahbari [name], MRN:  [id number], DOB:  Mar 13, 1946, LOS: 5 ADMISSION DATE:  03/03/2021, CONSULTATION DATE:  03/02/2021 REFERRING MD:  Thomasena Edis CHIEF COMPLAINT:  AMS   History of Present Illness:  Connor [name] is a 75 y.o. male who has a PMH as outlined below who presented to [facility] ED 9/19 as code stroke.  He woke up his normal self that morning but then had acute onset of severe headache. He called EMS and upon their arrival, he had AMS and left facial droop.  SBP was noted to be 270.  He was brought to ED where CT head demonstrated SAH.  He was intubated for airway protection and was started on Cleviprex for BP control with goal SBP 120 - 140.  Significant Hospital Events: Including procedures, antibiotic start and stop dates in addition to other pertinent events   9/19 > admit. 9/20 > transferred to [facility] service 9/21 > tolerated spontaneous breathing trial, extubated 9/22 > remain agitated requiring Precedex infusion 9/24 weaning precedex, escalating antihypertensive reg and diuresing. Continuing on unasyn -- wbc in rising to 17  Interim History / Subjective:  Weaning precedex this morning Increasing antiHTN reg  Objective:  Blood pressure 133/62, pulse 60, temperature 99.3 F (37.4 C), temperature source Axillary, resp. rate 18, height 5\' 10"  (1.778 m), weight 132.1 kg, SpO2 96 %.        Intake/Output Summary (Last 24 hours) at 03/10/2021 0958 Last data filed at 03/10/2021 0600 Gross per 24 hour  Intake 2817.4 ml  Output 950 ml  Net 1867.4 ml   Filed Weights   03/08/21 0500 03/09/21 0419 03/10/21 0500  Weight: 129.4 kg 130.1 kg 132.1 kg    Examination:   Physical exam: General: Critically ill appearing older adult M  HEENT: NCAT NRB in place. Pink mm. Trachea midline  Neuro: Encephalopathic and sedated , awakens to voice following simple commands  Pulm: symmetrical chest expansion, slightly increased rate. NRB. Scattered rhonchi and crackles  Heart: rr s1s2 no  rgm Abdomen: Obese soft round ntnd + bowel sounds  Skin:c/d/w no rash  Resolved Hospital Problem list:    Assessment & Plan:   SAH in setting of hypertensive emergency -cta H/n, MRA without aneurysm P -SBP goal <140 -TCDs MWF  -Keppra, nimodipine -repeat CTA versus DSA in few days -cont efforts at weaning cleviprex   Acute encephalopathy-- SAH, hypertensive encephalopathy  -now likely confounded by sedation, infection, delirium P -wean dexmed -delirium precautions   Acute hypoxic respiratory failure Suspected aspiration PNA Pulmonary edema  P -unasyn -PRN O2 supplementation -lasix  -AM CXR   Poorly controlled HTN -adding home doze cozaar and home dose hydralazine to current enteral reg (clonidine 0.3mg  TID, norvasc 10mg  qD,  as well as cleviprex gtt and PRN labetalol) -SBP goal < 140, wean cleviprex as able   AKI Hypernatremia -FWF started a few days ago but Na not rechecked since P -send BMP  DM2 with hyperglycemia -rSSI -incr levemire to 40unit BID  -DM coordinator following   Morbid obesity  -EN per RDN  Best practice (evaluated daily):  Diet/type: EN per RDN DVT prophylaxis: SCD GI prophylaxis: PPI Lines: N/A Foley: Apply condom cath Code Status:  full code Last date of multidisciplinary goals of care discussion: 9/21: Updated patient's sons at bedside, decision was to continue full aggressive care   CRITICAL CARE Performed by:   Total critical care time: 41 minutes  Critical care time was exclusive of separately billable procedures and treating  other patients.  Critical care was necessary to treat or prevent imminent or life-threatening deterioration.  Critical care was time spent personally by me on the following activities: development of treatment plan with patient and/or surrogate as well as nursing, discussions with consultants, evaluation of patient's response to treatment, examination of patient, obtaining history from  patient or surrogate, ordering and performing treatments and interventions, ordering and review of laboratory studies, ordering and review of radiographic studies, pulse oximetry and re-evaluation of patient's condition.  Tessie Fass MSN, AGACNP-BC [place] [facility]monary/Critical Care Medicine [phone] If no answer, [phone] 03/10/2021, 9:58 AM

## 2021-03-11 ENCOUNTER — Inpatient Hospital Stay (HOSPITAL_COMMUNITY): Payer: Medicare HMO

## 2021-03-11 DIAGNOSIS — I609 Nontraumatic subarachnoid hemorrhage, unspecified: Secondary | ICD-10-CM | POA: Diagnosis not present

## 2021-03-11 DIAGNOSIS — I161 Hypertensive emergency: Secondary | ICD-10-CM | POA: Diagnosis not present

## 2021-03-11 DIAGNOSIS — J9601 Acute respiratory failure with hypoxia: Secondary | ICD-10-CM | POA: Diagnosis not present

## 2021-03-11 LAB — GLUCOSE, CAPILLARY
Glucose-Capillary: 126 mg/dL — ABNORMAL HIGH (ref 70–99)
Glucose-Capillary: 127 mg/dL — ABNORMAL HIGH (ref 70–99)
Glucose-Capillary: 133 mg/dL — ABNORMAL HIGH (ref 70–99)
Glucose-Capillary: 138 mg/dL — ABNORMAL HIGH (ref 70–99)
Glucose-Capillary: 139 mg/dL — ABNORMAL HIGH (ref 70–99)
Glucose-Capillary: 148 mg/dL — ABNORMAL HIGH (ref 70–99)
Glucose-Capillary: 155 mg/dL — ABNORMAL HIGH (ref 70–99)
Glucose-Capillary: 163 mg/dL — ABNORMAL HIGH (ref 70–99)
Glucose-Capillary: 177 mg/dL — ABNORMAL HIGH (ref 70–99)
Glucose-Capillary: 180 mg/dL — ABNORMAL HIGH (ref 70–99)
Glucose-Capillary: 193 mg/dL — ABNORMAL HIGH (ref 70–99)
Glucose-Capillary: 199 mg/dL — ABNORMAL HIGH (ref 70–99)
Glucose-Capillary: 226 mg/dL — ABNORMAL HIGH (ref 70–99)
Glucose-Capillary: 241 mg/dL — ABNORMAL HIGH (ref 70–99)
Glucose-Capillary: 247 mg/dL — ABNORMAL HIGH (ref 70–99)

## 2021-03-11 LAB — BASIC METABOLIC PANEL
Anion gap: 9 (ref 5–15)
Anion gap: 12 (ref 5–15)
Anion gap: 12 (ref 5–15)
BUN: 64 mg/dL — ABNORMAL HIGH (ref 8–23)
BUN: 73 mg/dL — ABNORMAL HIGH (ref 8–23)
BUN: 77 mg/dL — ABNORMAL HIGH (ref 8–23)
CO2: 17 mmol/L — ABNORMAL LOW (ref 22–32)
CO2: 15 mmol/L — ABNORMAL LOW (ref 22–32)
CO2: 15 mmol/L — ABNORMAL LOW (ref 22–32)
Calcium: 9 mg/dL (ref 8.9–10.3)
Calcium: 8.7 mg/dL — ABNORMAL LOW (ref 8.9–10.3)
Calcium: 8.8 mg/dL — ABNORMAL LOW (ref 8.9–10.3)
Chloride: 128 mmol/L — ABNORMAL HIGH (ref 98–111)
Chloride: 126 mmol/L — ABNORMAL HIGH (ref 98–111)
Chloride: 124 mmol/L — ABNORMAL HIGH (ref 98–111)
Creatinine, Ser: 4.14 mg/dL — ABNORMAL HIGH (ref 0.61–1.24)
Creatinine, Ser: 4.77 mg/dL — ABNORMAL HIGH (ref 0.61–1.24)
Creatinine, Ser: 4.87 mg/dL — ABNORMAL HIGH (ref 0.61–1.24)
GFR, Estimated: 14 mL/min — ABNORMAL LOW (ref >60)
GFR, Estimated: 12 mL/min — ABNORMAL LOW (ref >60)
GFR, Estimated: 12 mL/min — ABNORMAL LOW (ref >60)
Glucose, Bld: 143 mg/dL — ABNORMAL HIGH (ref 70–99)
Glucose, Bld: 300 mg/dL — ABNORMAL HIGH (ref 70–99)
Glucose, Bld: 293 mg/dL — ABNORMAL HIGH (ref 70–99)
Potassium: 3.5 mmol/L (ref 3.5–5.1)
Potassium: 3.4 mmol/L — ABNORMAL LOW (ref 3.5–5.1)
Potassium: 3.8 mmol/L (ref 3.5–5.1)
Sodium: 154 mmol/L — ABNORMAL HIGH (ref 135–145)
Sodium: 153 mmol/L — ABNORMAL HIGH (ref 135–145)
Sodium: 151 mmol/L — ABNORMAL HIGH (ref 135–145)

## 2021-03-11 LAB — CBC
HCT: 46.8 % (ref 39.0–52.0)
Hemoglobin: 15.3 g/dL (ref 13.0–17.0)
MCH: 32.1 pg (ref 26.0–34.0)
MCHC: 32.7 g/dL (ref 30.0–36.0)
MCV: 98.3 fL (ref 80.0–100.0)
Platelets: 125 10*3/uL — ABNORMAL LOW (ref 150–400)
RBC: 4.76 MIL/uL (ref 4.22–5.81)
RDW: 14.6 % (ref 11.5–15.5)
WBC: 13.1 10*3/uL — ABNORMAL HIGH (ref 4.0–10.5)
nRBC: 0 % (ref 0.0–0.2)

## 2021-03-11 LAB — TRIGLYCERIDES: Triglycerides: 105 mg/dL (ref <150)

## 2021-03-11 IMAGING — US US RENAL
1 series · 14 of 25 positions shown · non-contrast
Comparison: None.

CLINICAL DATA: Acute kidney injury.

EXAM:
RENAL / URINARY TRACT ULTRASOUND COMPLETE

[Series 1: us renal · 14 of 36 slices shown]
[im 1/36]
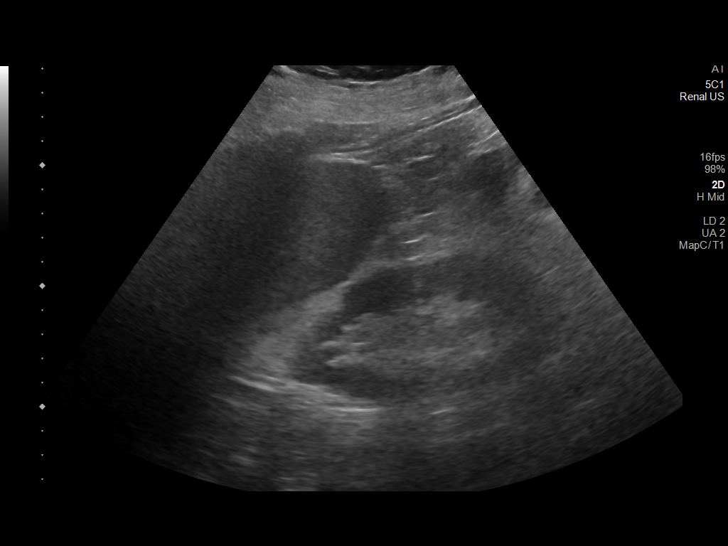
[im 3/36]
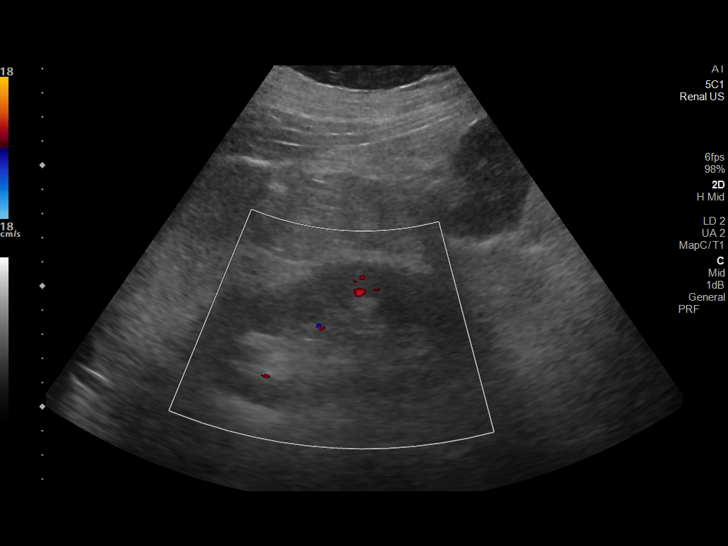
[im 6/36]
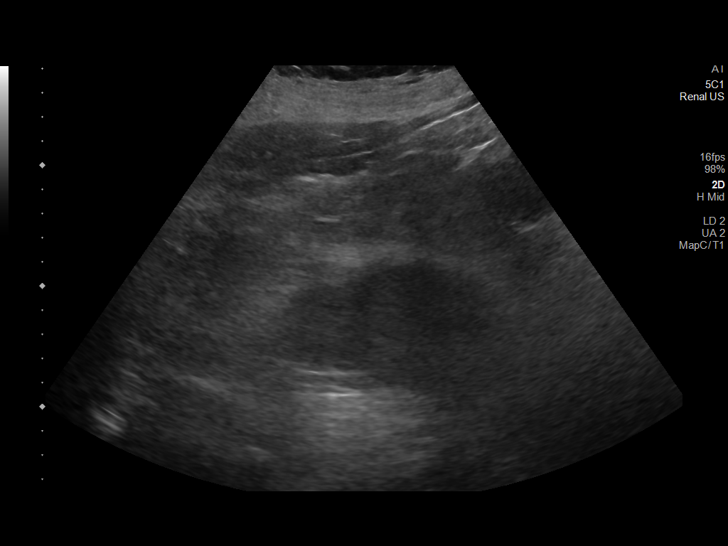
[im 9/36]
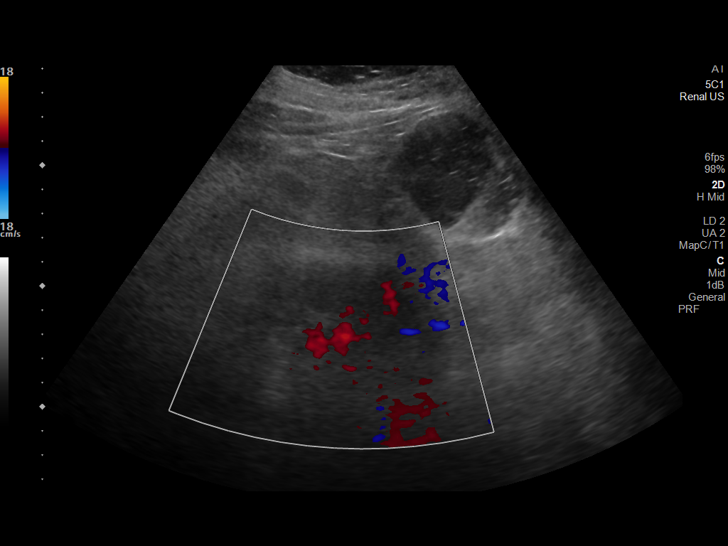
[im 12/36]
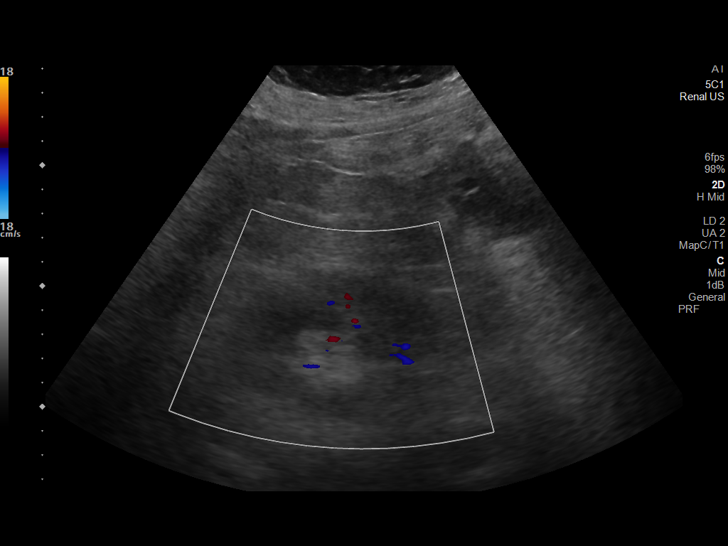
[im 14/36]
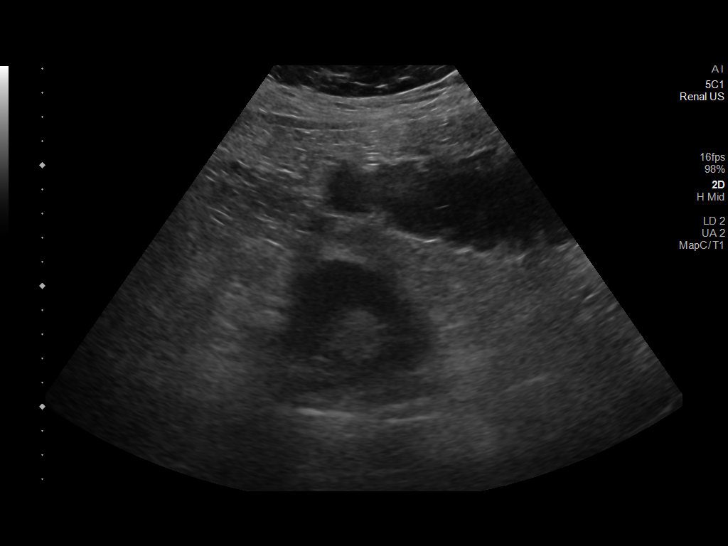
[im 17/36]
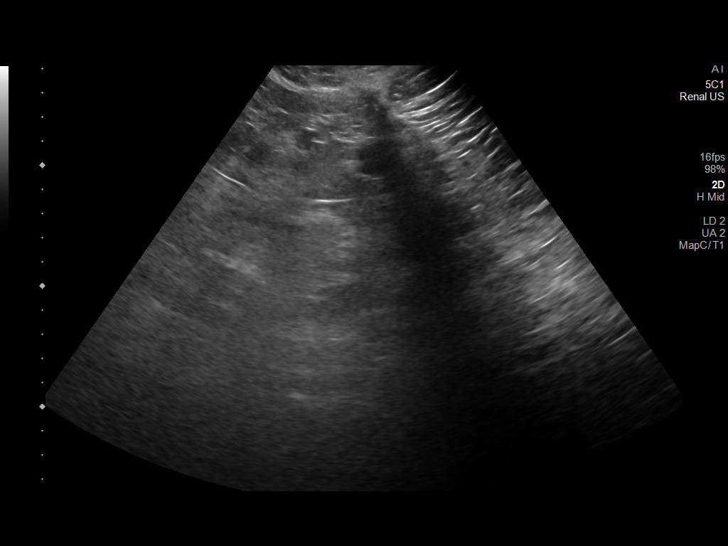
[im 19/36]
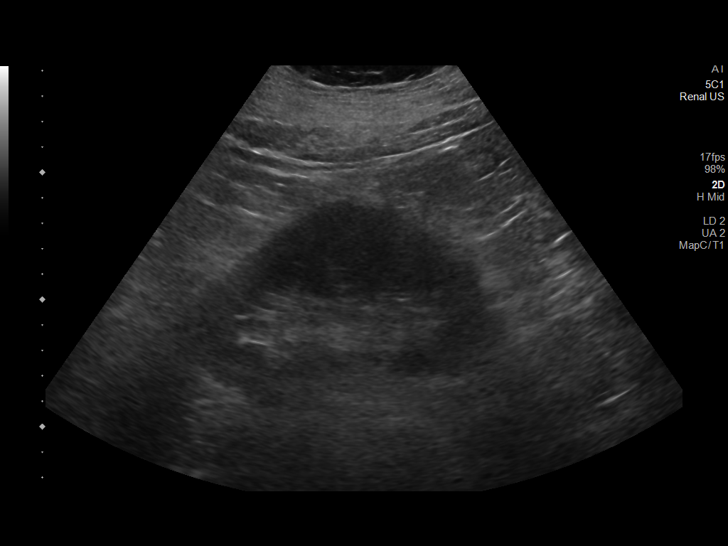
[im 22/36]
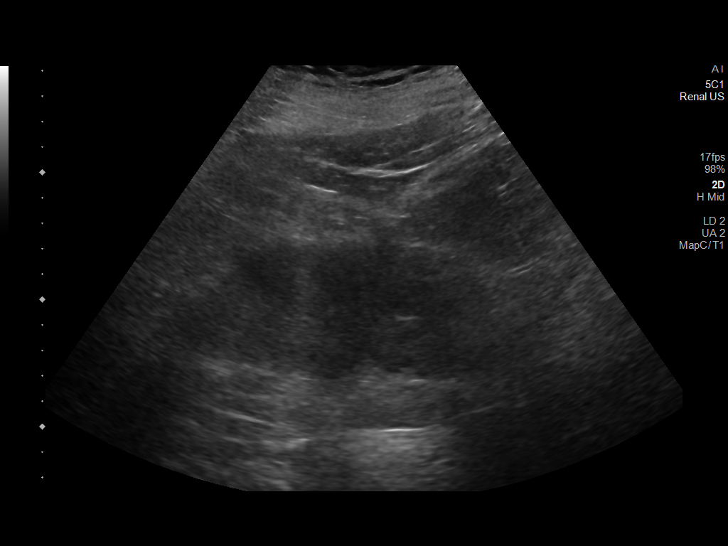
[im 24/36]
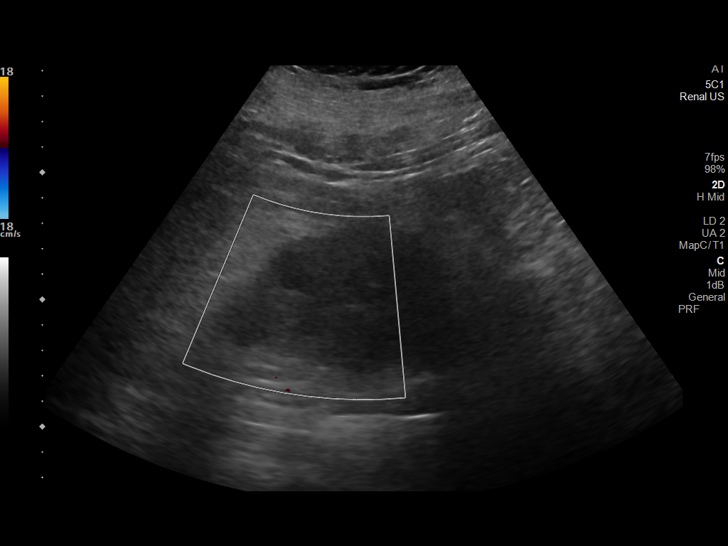
[im 27/36]
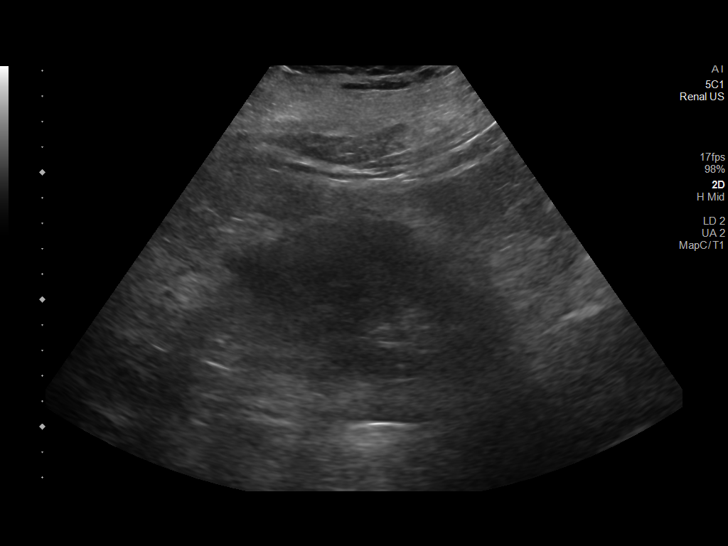
[im 30/36]
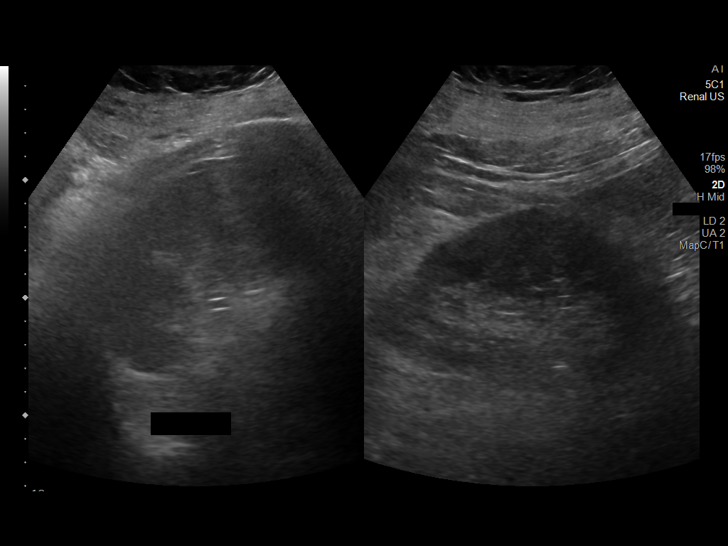
[im 33/36]
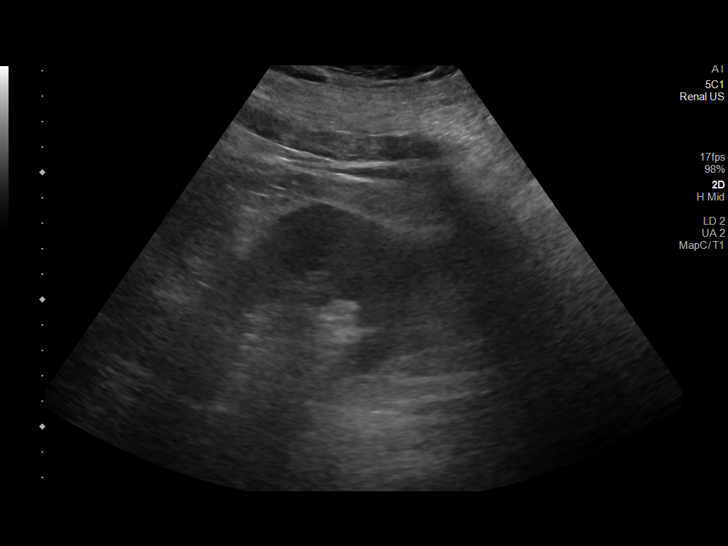
[im 36/36]
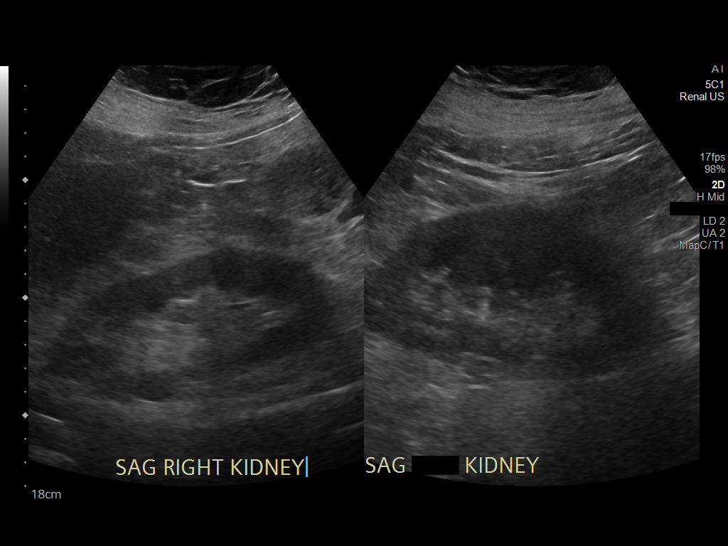

[14 of 25 positions shown; findings below may reference images not displayed]

FINDINGS: Right Kidney:

Renal measurements: 12.4 x 7.0 x 6.2 cm = volume: 280 mL.
Echogenicity within normal limits. No mass or hydronephrosis
visualized.

Left Kidney:

Renal measurements: 12.6 x 7.3 x 6.2 cm = volume: 299 mL. 1.8 cm
simple cyst is noted in upper pole. Echogenicity within normal
limits. No mass or hydronephrosis visualized.

Bladder:

Decompressed secondary to Foley catheter.

Other:

None.
IMPRESSION: No significant renal abnormality is noted.

## 2021-03-11 MED ORDER — MINOXIDIL 2.5 MG PO TABS
2.5000 mg | ORAL_TABLET | Freq: Every day | ORAL | Status: DC
  Administered 2021-03-11 – 2021-03-13 (×3): 2.5 mg
  Filled 2021-03-11 (×3): qty 1

## 2021-03-11 MED ORDER — POTASSIUM CHLORIDE 20 MEQ PO PACK
60.0000 meq | PACK | Freq: Once | ORAL | Status: DC
  Filled 2021-03-11: qty 3

## 2021-03-11 MED ORDER — INSULIN ASPART 100 UNIT/ML IJ SOLN
0.0000 [IU] | INTRAMUSCULAR | Status: DC
  Administered 2021-03-11 (×2): 7 [IU] via SUBCUTANEOUS
  Administered 2021-03-11: 3 [IU] via SUBCUTANEOUS
  Administered 2021-03-12: 11 [IU] via SUBCUTANEOUS
  Administered 2021-03-12 (×3): 7 [IU] via SUBCUTANEOUS
  Administered 2021-03-12 (×2): 11 [IU] via SUBCUTANEOUS
  Administered 2021-03-12: 7 [IU] via SUBCUTANEOUS
  Administered 2021-03-13: 4 [IU] via SUBCUTANEOUS
  Administered 2021-03-13 (×2): 7 [IU] via SUBCUTANEOUS
  Administered 2021-03-13: 11 [IU] via SUBCUTANEOUS

## 2021-03-11 MED ORDER — SODIUM CHLORIDE 0.9 % IV SOLN: INTRAVENOUS | Status: DC | PRN

## 2021-03-11 MED ORDER — POTASSIUM CHLORIDE 20 MEQ PO PACK
60.0000 meq | PACK | Freq: Once | ORAL | Status: AC
  Administered 2021-03-11: 60 meq

## 2021-03-11 MED ORDER — OSMOLITE 1.5 CAL PO LIQD: 1000.0000 mL | ORAL | Status: DC

## 2021-03-11 MED ORDER — SODIUM CHLORIDE 0.9 % IV SOLN
2.0000 g | INTRAVENOUS | Status: DC
Start: 2021-03-11 — End: 2021-03-14
  Administered 2021-03-11 – 2021-03-13 (×3): 2 g via INTRAVENOUS
  Filled 2021-03-11 (×3): qty 20

## 2021-03-11 MED ORDER — INSULIN DETEMIR 100 UNIT/ML ~~LOC~~ SOLN
30.0000 [IU] | Freq: Every day | SUBCUTANEOUS | Status: DC
  Administered 2021-03-11: 30 [IU] via SUBCUTANEOUS
  Filled 2021-03-11 (×2): qty 0.3

## 2021-03-11 MED ORDER — LACTATED RINGERS IV SOLN: INTRAVENOUS | Status: DC

## 2021-03-11 MED ORDER — OSMOLITE 1.5 CAL PO LIQD
1000.0000 mL | ORAL | Status: DC
  Administered 2021-03-11: 1000 mL

## 2021-03-11 MED ORDER — LACTATED RINGERS IV BOLUS
1000.0000 mL | Freq: Once | INTRAVENOUS | Status: AC
  Administered 2021-03-11: 1000 mL via INTRAVENOUS

## 2021-03-11 NOTE — Progress Notes (Signed)
Assisted tele visit to patient with family member.  Glorene Leitzke Harold, RN  

## 2021-03-11 NOTE — Progress Notes (Addendum)
NAMEJeson Camacho, MRN:  [id number], DOB:  Oct 30, 1945, LOS: 6 ADMISSION DATE:  02/18/2021, CONSULTATION DATE:  03/04/2021 REFERRING MD:  Thomasena Edis CHIEF COMPLAINT:  AMS   History of Present Illness:  Connor [name] is a 75 y.o. male who has a PMH as outlined below who presented to [facility] ED 9/19 as code stroke.  He woke up his normal self that morning but then had acute onset of severe headache. He called EMS and upon their arrival, he had AMS and left facial droop.  SBP was noted to be 270.  He was brought to ED where CT head demonstrated SAH.  He was intubated for airway protection and was started on Cleviprex for BP control with goal SBP 120 - 140.  Significant Hospital Events: Including procedures, antibiotic start and stop dates in addition to other pertinent events   9/19 > admit. 9/20 > transferred to [facility] service 9/21 > tolerated spontaneous breathing trial, extubated 9/22 > remain agitated requiring Precedex infusion 9/24 weaning precedex, escalating antihypertensive reg and diuresing. Continuing on unasyn -- wbc in rising to 17 9/25 placing an art line   Interim History / Subjective:  Re-intubated yesterday afternoon  For some reason having a hard time with labs -- blood keeps hemolyzing before it can be run & It is very difficult yo obtain samples from pt  Objective:  Blood pressure (!) 118/44, pulse 73, temperature (!) 100.4 F (38 C), temperature source Axillary, resp. rate (!) 25, height 5\' 10"  (1.778 m), weight 129.4 kg, SpO2 97 %.    Vent Mode: PRVC FiO2 (%):  [35 %-100 %] 60 % Set Rate:  [16 bmp-22 bmp] 22 bmp Vt Set:  [580 mL] 580 mL PEEP:  [8 cmH20] 8 cmH20 Plateau Pressure:  [18 cmH20] 18 cmH20   Intake/Output Summary (Last 24 hours) at 03/11/2021 1131 Last data filed at 03/11/2021 0600 Gross per 24 hour  Intake 1578.53 ml  Output 580 ml  Net 998.53 ml   Filed Weights   03/09/21 0419 03/10/21 0500 03/11/21 0459  Weight: 130.1 kg 132.1 kg 129.4 kg     Examination:   Physical exam:   General: Critically ill appearing older adult M intubated not sedated NAD  HEENT: NCAT pink dry mm ETT secure some tan secretions  Neuro: Moving BUE BLE spontaneously PERRL Does not follow commands  Pulm: Symmetrical chest expansion, even, mechanically ventilated. Diminished bibasilar sounds  Heart: rrr s1s2 no rgm cap refill < 3 sec  Abdomen: Obese soft round. + bowel sounds x4  Skin: c/d/w no rash  Resolved Hospital Problem list:    Assessment & Plan:   Acute encephaloapthy SAH, AKI, lingering sedation in setting of AKI and prolonged precedex use  -now likely confounded by sedation, infection, delirium P -delirium precautions   SAH, in setting of hypertensive emergency  -cta H/n, MRA without aneurysm P -SBP goal <160 -MWF TCDs -- no evidence of vasospasm thusfar  -dc keppra   Acute hypoxic respiratory failure requiring reintubation  Strep pneumoniae PNA Suspected aspiration  Pulmonary edema  Hx OSA noncompliant with CPAP  P -MV support -WUA/SBT as indicated -changing unasyn to ceftriaxone  -likely to need trach given mentation   Poorly controlled HTN // Acute hypotension  -adding home doze cozaar and home dose hydralazine to current enteral reg (clonidine 0.3mg  TID, norvasc 10mg  qD,  as well as cleviprex gtt and PRN labetalol) -SBP goal < 140, wean cleviprex as able  -9/24 required a robust antihypertensive reg: cozaar, hydral,  clonidine, novasc, minoxidil + cleviprex gtt prior to intubation. Following intubation, has been hypotensive -- started on low dose peripheral NE overnight P -holding minoxidil and AM hydral  -place art line  AKI  Hypernatremia -FWF started a few days ago but Na not rechecked since P -foley -FWF -mIVF  -trend renal indices and UOP  DM2 with hyperglycemia -transition off of insulin gtt back to rSSI  -qHS basal only while we work on restarting EN   Abdominal distention -KUB with some concern for  ileus -- EN was held as a result. Pt then had a large BM P -restart EN at 8ml/hr -will monitor throughout the day, hopefully we can further advance   Morbid obesity  -EN per RDN  Best practice (evaluated daily):  Diet/type: EN per RDN DVT prophylaxis: SCD GI prophylaxis: PPI Lines: N/A Foley: Foley  Code Status:  full code Last date of multidisciplinary goals of care discussion: 9/25- tammy sig other and son Kathlene November updated    CRITICAL CARE Performed by: Lanier Clam   Total critical care time: 57 minutes  Critical care time was exclusive of separately billable procedures and treating other patients. Critical care was necessary to treat or prevent imminent or life-threatening deterioration.  Critical care was time spent personally by me on the following activities: development of treatment plan with patient and/or surrogate as well as nursing, discussions with consultants, evaluation of patient's response to treatment, examination of patient, obtaining history from patient or surrogate, ordering and performing treatments and interventions, ordering and review of laboratory studies, ordering and review of radiographic studies, pulse oximetry and re-evaluation of patient's condition.  Tessie Fass MSN, AGACNP-BC [facility] Pulmonary/Critical Care Medicine Amion for pager  03/11/2021, 11:31 AM

## 2021-03-11 NOTE — Procedures (Signed)
Arterial Catheter Insertion Procedure Note  Connor [name]  [id number]  Sep 24, 1945  Date:03/11/21  Time:8:45 AM    Provider Performing: Audrie Lia    Procedure: Insertion of Arterial Line (51833) without US guidance  Indication(s) Blood pressure monitoring and/or need for frequent ABGs  Consent Unable to obtain consent due to emergent nature of procedure.  Anesthesia None   Time Out Verified patient identification, verified procedure, site/side was marked, verified correct patient position, special equipment/implants available, medications/allergies/relevant history reviewed, required imaging and test results available.   Sterile Technique Maximal sterile technique including full sterile barrier drape, hand hygiene, sterile gown, sterile gloves, mask, hair covering, sterile ultrasound probe cover (if used).   Procedure Description Area of catheter insertion was cleaned with chlorhexidine and draped in sterile fashion. Without real-time ultrasound guidance an arterial catheter was placed into the right radial artery.  Appropriate arterial tracings confirmed on monitor.     Complications/Tolerance None; patient tolerated the procedure well.   EBL Minimal   Specimen(s) None

## 2021-03-11 NOTE — Progress Notes (Signed)
Contacted e-link at shift change due to patients A line being in the 180's systolic. Goal is less than 160 systolic. Day shift nurse gave 30 mg of labetalol from 1800-1900 and blood pressure was still high. Requested Hydralazine. John with E-line said waveform on A line didn't look great despite zeroing and optimizing a line, however MAP looks good at this time. Will plan to go by cuff reading and A line Map per e-link.

## 2021-03-11 NOTE — Progress Notes (Signed)
SLP Cancellation Note  Patient Details Name: Connor [name] MRN: [id number] DOB: 10-Jun-1946   Cancelled treatment:       Reason Eval/Treat Not Completed: Medical issues which prohibited therapy;Other (comment) (patient remains on ventilator. SLP will f/u for patient readiness)   Angela Nevin, MA, CCC-SLP Speech Therapy

## 2021-03-12 ENCOUNTER — Inpatient Hospital Stay (HOSPITAL_COMMUNITY): Payer: Medicare HMO

## 2021-03-12 DIAGNOSIS — I609 Nontraumatic subarachnoid hemorrhage, unspecified: Secondary | ICD-10-CM

## 2021-03-12 LAB — BASIC METABOLIC PANEL
Anion gap: 12 (ref 5–15)
BUN: 86 mg/dL — ABNORMAL HIGH (ref 8–23)
CO2: 15 mmol/L — ABNORMAL LOW (ref 22–32)
Calcium: 9 mg/dL (ref 8.9–10.3)
Chloride: 124 mmol/L — ABNORMAL HIGH (ref 98–111)
Creatinine, Ser: 4.94 mg/dL — ABNORMAL HIGH (ref 0.61–1.24)
GFR, Estimated: 12 mL/min — ABNORMAL LOW (ref >60)
Glucose, Bld: 313 mg/dL — ABNORMAL HIGH (ref 70–99)
Potassium: 3.7 mmol/L (ref 3.5–5.1)
Sodium: 151 mmol/L — ABNORMAL HIGH (ref 135–145)

## 2021-03-12 LAB — GLUCOSE, CAPILLARY
Glucose-Capillary: 259 mg/dL — ABNORMAL HIGH (ref 70–99)
Glucose-Capillary: 234 mg/dL — ABNORMAL HIGH (ref 70–99)
Glucose-Capillary: 251 mg/dL — ABNORMAL HIGH (ref 70–99)
Glucose-Capillary: 241 mg/dL — ABNORMAL HIGH (ref 70–99)
Glucose-Capillary: 251 mg/dL — ABNORMAL HIGH (ref 70–99)
Glucose-Capillary: 220 mg/dL — ABNORMAL HIGH (ref 70–99)
Glucose-Capillary: 210 mg/dL — ABNORMAL HIGH (ref 70–99)

## 2021-03-12 MED ORDER — INSULIN DETEMIR 100 UNIT/ML ~~LOC~~ SOLN
10.0000 [IU] | Freq: Once | SUBCUTANEOUS | Status: AC
  Administered 2021-03-12: 10 [IU] via SUBCUTANEOUS
  Filled 2021-03-12: qty 0.1

## 2021-03-12 MED ORDER — INSULIN DETEMIR 100 UNIT/ML ~~LOC~~ SOLN
30.0000 [IU] | Freq: Two times a day (BID) | SUBCUTANEOUS | Status: DC
  Administered 2021-03-13: 30 [IU] via SUBCUTANEOUS
  Filled 2021-03-12 (×3): qty 0.3

## 2021-03-12 MED ORDER — VITAL AF 1.2 CAL PO LIQD
1000.0000 mL | ORAL | Status: DC
  Administered 2021-03-12: 1000 mL

## 2021-03-12 MED ORDER — INSULIN DETEMIR 100 UNIT/ML ~~LOC~~ SOLN
20.0000 [IU] | Freq: Every day | SUBCUTANEOUS | Status: AC
  Administered 2021-03-12: 20 [IU] via SUBCUTANEOUS
  Filled 2021-03-12: qty 0.2

## 2021-03-12 MED ORDER — INSULIN ASPART 100 UNIT/ML IJ SOLN
3.0000 [IU] | INTRAMUSCULAR | Status: DC
  Administered 2021-03-12 – 2021-03-13 (×6): 3 [IU] via SUBCUTANEOUS

## 2021-03-12 NOTE — Progress Notes (Signed)
Transcranial Doppler   Date POD PCO2 HCT BP   MCA ACA PCA OPHT SIPH VERT Basilar  9/21 MES         Right  Left   *  *   *  *   *  *   18  18   *  *   *  *   *       9/23 [facility]          Right  Left    30   63           20   25    14  12                        9/26JH          Right  Left    77   69    -37   *    35   33    20   17    *   *    -26   -24    -45                   Right  Left                                                                 Right  Left                                                               Right  Left                                                               Right  Left                                                       MCA = Middle Cerebral Artery      OPHT = Opthalmic Artery     BASILAR = Basilar Artery   ACA = Anterior Cerebral Artery     SIPH = Carotid Siphon PCA = Posterior Cerebral Artery   VERT = Verterbral Artery                    Normal MCA = 62+\-12 ACA = 50+\-12 PCA = 42+\-23  *-unable to insonate   [place] Ratio RT 1.88 LT 1.17  Results can be found under chart review under CV PROC. 03/12/2021 7:19 PM Nicklos Gaxiola RVT, RDMS

## 2021-03-12 NOTE — Progress Notes (Signed)
Nutrition Follow-up  DOCUMENTATION CODES:  Obesity unspecified  INTERVENTION:  Recommend adjusting to the following tube feeding regimen via Cortrak: Vital AF 1.2 at 60 ml/h (1440 ml per day) with Prosource TF 90 ml BID (40kcal and 11g of protein per packet) 200 ml free water every 6 hours This regimen provides 1880 kcal, 152 gm protein, and 1968 ml free water daily (TF+flush)  NUTRITION DIAGNOSIS:  Inadequate oral intake related to inability to eat as evidenced by NPO status. Ongoing.   GOAL:  Patient will meet greater than or equal to 90% of their needs Met with TF  MONITOR:  TF tolerance, Vent status  REASON FOR ASSESSMENT:  Consult, Ventilator Enteral/tube feeding initiation and management  ASSESSMENT:  Pt with PMH of DM and HTN admitted with extensive SAH with IVH per MD likely due to chronic HTN.   9/19 intubated 9/21 extubated 9/23 cortrak placed; tip gastric 9/24 intubated  Pt able to be extubated 9/21 but after responsiveness declined, was reintubated 9/24. Worsening AKI and renal indices. Cleviprex able to be dc 9/25. TF currently infusing at 46m/h. Concern raised for ileus over the weekend and feeds were held. Restarted after pt had BM. RN reports no issues with TF at this time, being advanced back to goal rate. Noted that SBG are elevated and insulin continue to be adjusted for better control. Will adjust TF regimen for re-intubation and increased needs.   MV: 13.6 L/min Temp (24hrs), Avg:100.2 F (37.9 C), Min:98.9 F (37.2 C), Max:101.7 F (38.7 C)   Intake/Output Summary (Last 24 hours) at 03/12/2021 1426 Last data filed at 03/12/2021 1100 Gross per 24 hour  Intake 3142.65 ml  Output 1385 ml  Net 1757.65 ml  Net IO Since Admission: 6,421.07 mL [03/12/21 1426]  Nutritionally Relevant Medications: Scheduled Meds:  docusate  100 mg Per Tube BID   feeding supplement (PROSource TF)  90 mL Per Tube BID   free water  200 mL Per Tube Q6H   insulin aspart   0-20 Units Subcutaneous Q4H   insulin aspart  3 Units Subcutaneous Q4H   insulin detemir  20 Units Subcutaneous QHS   niMODipine  60 mg Per Tube Q4H   Pantoprazole IV  40 mg Intravenous Daily   polyethylene glycol  17 g Per Tube Daily   Continuous Infusions:  cefTRIAXone (ROCEPHIN)  IV 2 g (03/12/21 1131)   feeding supplement (OSMOLITE 1.5 CAL) 30 mL/hr at 03/11/21 2200   lactated ringers 75 mL/hr at 03/12/21 1100   Labs Reviewed: Na 151 / Chloride 124 BUN 86 / creatinine 4.94 SBG ranges from 133-259 mg/dL over the last 24 hours HgbA1c 7.2% (9/19)  Diet Order:   Diet Order             Diet NPO time specified  Diet effective now                   EDUCATION NEEDS:  Not appropriate for education at this time  Skin:  Skin Assessment: Reviewed RN Assessment  Last BM:  9/24 - type 7  Height:  Ht Readings from Last 1 Encounters:  03/10/21 '5\' 10"'  (1.778 m)    Weight:  Wt Readings from Last 1 Encounters:  03/11/21 129.4 kg    BMI:  Body mass index is 40.93 kg/m.  Estimated Nutritional Needs:  Kcal:  1800 Protein:  >150 grams Fluid:  > 1.8 L/day  RRanell Patrick RD, LDN Clinical Dietitian Pager on [facility]

## 2021-03-12 NOTE — Progress Notes (Signed)
Inpatient Diabetes Program Recommendations  AACE/ADA: New Consensus Statement on Inpatient Glycemic Control (2015)  Target Ranges:  Prepandial:   less than 140 mg/dL      Peak postprandial:   less than 180 mg/dL (1-2 hours)      Critically ill patients:  140 - 180 mg/dL   Lab Results  Component Value Date   GLUCAP 234 (H) 03/12/2021   HGBA1C 7.2 (H) 03/12/2021    Review of Glycemic Control Results for Connor [name], Connor [name] (MRN [id number]) as of 03/12/2021 08:49  Ref. Range 03/11/2021 09:03 03/11/2021 10:07 03/11/2021 11:29 03/11/2021 15:23 03/11/2021 19:59 03/11/2021 23:41 03/12/2021 03:21 03/12/2021 07:42  Glucose-Capillary Latest Ref Range: 70 - 99 mg/dL 321 (H) 224 (H) 825 (H) 226 (H) 241 (H) 247 (H) 259 (H) 234 (H)   Diabetes history: DM 2 Outpatient Diabetes medications:  Farxiga 10 mg daily, Novolog 10-12 units tid with meals, Lantus 35 units q HS, Metformin 2000 mg XR daily Current orders for Inpatient glycemic control:  Levemir 30 units qhs Novolog 0-20 units Q4 hours Osmolite 30 ml/hour  Inpatient Diabetes Program Recommendations:    -  Add Novolog tube feed coverage 4 units q 4 hours.   Christena Deem RN, MSN, BC-ADM Inpatient Diabetes Coordinator Team Pager [phone] (8a-5p)

## 2021-03-12 NOTE — Progress Notes (Signed)
Speech Language Pathology Discharge Patient Details Name: Connor [name] MRN: [id number] DOB: 11-20-1945 Today's Date: 03/12/2021 Time:  -     Patient discharged from SLP services secondary to medical decline - will need to re-order SLP to resume therapy services. Pt has not been appropriate for speech therapy with multiple attempts since 9/20.  Please see latest therapy progress note for current level of functioning and progress toward goals.    Progress and discharge plan discussed with patient and/or caregiver: Patient unable to participate in discharge planning and no caregivers available  GO    Jeannie Done, SLP-Student   Jeannie Done 03/12/2021, 12:04 PM

## 2021-03-12 NOTE — Progress Notes (Signed)
NAMERidgely Anastacio, MRN:  [id number], DOB:  [birth year], LOS: 7 ADMISSION DATE:  March 13, 2021, CONSULTATION DATE:  02/23/2021 REFERRING MD:  Thomasena Edis CHIEF COMPLAINT:  AMS   History of Present Illness:  [place] is a 75 y.o. male who has a PMH as outlined below who presented to [facility] ED 9/19 as code stroke.  He woke up his normal self that morning but then had acute onset of severe headache. He called EMS and upon their arrival, he had AMS and left facial droop.  SBP was noted to be 270.  He was brought to ED where CT head demonstrated SAH.  He was intubated for airway protection and was started on Cleviprex for BP control with goal SBP 120 - 140.  Significant Hospital Events: Including procedures, antibiotic start and stop dates in addition to other pertinent events   9/19 > admit. 9/20 > transferred to [facility] service 9/21 > tolerated spontaneous breathing trial, extubated 9/22 > remain agitated requiring Precedex infusion 9/24 weaning precedex, escalating antihypertensive reg and diuresing. Continuing on unasyn -- wbc in rising to 17 9/25 placing an art line   Interim History / Subjective:  9/26: tmax overnight 102.9, renal indices cont to rise at this time but uop adequate. Pt not responsive but will open eyes spontaneously. He is on full support with vent but appears to be abdominal breathing. Sats adequate. Tolerated cpap but still appears with increased wob. He has been off sedation since at least 9/25. Not ready for extubation.  Objective:  Blood pressure (!) 176/50, pulse 62, temperature 100.2 F (37.9 C), temperature source Axillary, resp. rate (!) 25, height 5\' 10"  (1.778 m), weight 129.4 kg, SpO2 96 %.    Vent Mode: PRVC FiO2 (%):  [50 %-60 %] 50 % Set Rate:  [22 bmp] 22 bmp Vt Set:  [580 mL] 580 mL PEEP:  [8 cmH20] 8 cmH20 Plateau Pressure:  [18 cmH20-20 cmH20] 19 cmH20   Intake/Output Summary (Last 24 hours) at 03/12/2021 0956 Last data filed at 03/12/2021 0800 Gross per 24  hour  Intake 3295.4 ml  Output 1695 ml  Net 1600.4 ml   Filed Weights   03/09/21 0419 03/10/21 0500 03/11/21 0459  Weight: 130.1 kg 132.1 kg 129.4 kg    Examination:   Physical exam:   General: Critically ill appearing older adult M intubated not sedated NAD  HEENT: NCAT pink dry mm ETT secure some tan secretions  Neuro: opens eyes spontaneously, not following commands. Withdrawal to pain bilaterally Pulm: + wheeze, mechanically ventilated with some abdominal breathing despite full support.  Diminished bibasilar sounds  Heart: rrr s1s2 no rgm cap refill < 3 sec  Abdomen: Obese soft round. + bowel sounds x4  Skin: c/d/w no rash  Resolved Hospital Problem list:    Assessment & Plan:   Acute encephaloapthy SAH, AKI, lingering sedation in setting of AKI and prolonged precedex use  -now likely confounded by sedation, infection, delirium P -delirium precautions  -encephalopathy persists, cont to hold sedation.   SAH, in setting of hypertensive emergency  -cta H/n, MRA without aneurysm P -SBP goal <160 -MWF TCDs -- no evidence of vasospasm thusfar    Acute hypoxic respiratory failure requiring reintubation  Strep pneumoniae PNA Suspected aspiration  Pulmonary edema  Hx OSA noncompliant with CPAP  P -MV support -WUA/SBT as indicated -cont ctx for 10 days with persistent fever.  -likely to need trach given mentation   Poorly controlled HTN // Acute hypotension   P -arterial  line with acceptable pressures.  -off sedation -off vasopressors.   AKI  Hypernatremia -FWF started a few days ago but Na not rechecked since P -foley -FWF -mIVF  -trend renal indices and UOP  DM2 with hyperglycemia -transition off of insulin gtt back to rSSI  -BS elevated again with increase in tf and remains acidotic with bicarb ~15.  -may need to go back to insulin infusion if unable to come down but will start with changing insulin to bid levemir, small scheduled tf coverage and ssi.   -tomorrow will be on 30 BID  Abdominal distention -KUB with some concern for ileus -- EN was held as a result. Pt then had a large BM P -tolerating tf  Morbid obesity  -EN per RDN  Best practice (evaluated daily):  Diet/type: EN per RDN DVT prophylaxis: SCD GI prophylaxis: PPI Lines: N/A Foley: Foley  Code Status:  full code Last date of multidisciplinary goals of care discussion: 9/25- tammy sig other and son Kathlene November updated    CRITICAL CARE Performed by: Briant Sites   Critical care time: The patient is critically ill with multiple organ systems failure and requires high complexity decision making for assessment and support, frequent evaluation and titration of therapies, application of advanced monitoring technologies and extensive interpretation of multiple databases.  Critical care time . This represents my time independent of the NPs time taking care of the pt. This is excluding procedures.    Briant Sites DO [place] [facility] 03/12/2021, 9:56 AM See Amion for pager If no response to pager, please call [phone] until 1900 After 1900 please call [facility] [phone]

## 2021-03-12 NOTE — Progress Notes (Signed)
PT Cancellation Note  Patient Details Name: Connor [name] MRN: [id number] DOB: 05-22-1946   Cancelled Treatment:    Reason Eval/Treat Not Completed: Medical issues which prohibited therapy; note pt re-intubated overnight and now hypotensive.  Will check back another day.   Elray Mcgregor 03/12/2021, 8:48 AM Sheran Lawless, PT Acute Rehabilitation Services [phone] Office:[phone] 03/12/2021

## 2021-03-13 ENCOUNTER — Inpatient Hospital Stay (HOSPITAL_COMMUNITY): Payer: Medicare HMO

## 2021-03-13 DIAGNOSIS — S066X0A Traumatic subarachnoid hemorrhage without loss of consciousness, initial encounter: Secondary | ICD-10-CM | POA: Diagnosis not present

## 2021-03-13 DIAGNOSIS — I469 Cardiac arrest, cause unspecified: Secondary | ICD-10-CM

## 2021-03-13 DIAGNOSIS — I609 Nontraumatic subarachnoid hemorrhage, unspecified: Secondary | ICD-10-CM | POA: Diagnosis not present

## 2021-03-13 DIAGNOSIS — J9601 Acute respiratory failure with hypoxia: Secondary | ICD-10-CM | POA: Diagnosis not present

## 2021-03-13 DIAGNOSIS — N179 Acute kidney failure, unspecified: Secondary | ICD-10-CM | POA: Diagnosis not present

## 2021-03-13 DIAGNOSIS — I615 Nontraumatic intracerebral hemorrhage, intraventricular: Secondary | ICD-10-CM | POA: Diagnosis not present

## 2021-03-13 DIAGNOSIS — E870 Hyperosmolality and hypernatremia: Secondary | ICD-10-CM | POA: Diagnosis not present

## 2021-03-13 DIAGNOSIS — J9690 Respiratory failure, unspecified, unspecified whether with hypoxia or hypercapnia: Secondary | ICD-10-CM | POA: Diagnosis not present

## 2021-03-13 DIAGNOSIS — I959 Hypotension, unspecified: Secondary | ICD-10-CM | POA: Diagnosis not present

## 2021-03-13 LAB — CBC WITH DIFFERENTIAL/PLATELET
Abs Immature Granulocytes: 0.19 10*3/uL — ABNORMAL HIGH (ref 0.00–0.07)
Basophils Absolute: 0.1 10*3/uL (ref 0.0–0.1)
Basophils Relative: 1 %
Eosinophils Absolute: 0.4 10*3/uL (ref 0.0–0.5)
Eosinophils Relative: 2 %
HCT: 44.8 % (ref 39.0–52.0)
Hemoglobin: 14.4 g/dL (ref 13.0–17.0)
Immature Granulocytes: 1 %
Lymphocytes Relative: 7 %
Lymphs Abs: 1.2 10*3/uL (ref 0.7–4.0)
MCH: 31.8 pg (ref 26.0–34.0)
MCHC: 32.1 g/dL (ref 30.0–36.0)
MCV: 98.9 fL (ref 80.0–100.0)
Monocytes Absolute: 1.7 10*3/uL — ABNORMAL HIGH (ref 0.1–1.0)
Monocytes Relative: 10 %
Neutro Abs: 13.9 10*3/uL — ABNORMAL HIGH (ref 1.7–7.7)
Neutrophils Relative %: 79 %
Platelets: 96 10*3/uL — ABNORMAL LOW (ref 150–400)
RBC: 4.53 MIL/uL (ref 4.22–5.81)
RDW: 15.6 % — ABNORMAL HIGH (ref 11.5–15.5)
WBC: 17.4 10*3/uL — ABNORMAL HIGH (ref 4.0–10.5)
nRBC: 0 % (ref 0.0–0.2)

## 2021-03-13 LAB — COMPREHENSIVE METABOLIC PANEL
ALT: 23 U/L (ref 0–44)
AST: 77 U/L — ABNORMAL HIGH (ref 15–41)
Albumin: 2.1 g/dL — ABNORMAL LOW (ref 3.5–5.0)
Alkaline Phosphatase: 35 U/L — ABNORMAL LOW (ref 38–126)
Anion gap: 14 (ref 5–15)
BUN: 100 mg/dL — ABNORMAL HIGH (ref 8–23)
CO2: 13 mmol/L — ABNORMAL LOW (ref 22–32)
Calcium: 9.3 mg/dL (ref 8.9–10.3)
Chloride: 123 mmol/L — ABNORMAL HIGH (ref 98–111)
Creatinine, Ser: 5.86 mg/dL — ABNORMAL HIGH (ref 0.61–1.24)
GFR, Estimated: 10 mL/min — ABNORMAL LOW (ref >60)
Glucose, Bld: 206 mg/dL — ABNORMAL HIGH (ref 70–99)
Potassium: 3.8 mmol/L (ref 3.5–5.1)
Sodium: 150 mmol/L — ABNORMAL HIGH (ref 135–145)
Total Bilirubin: 0.8 mg/dL (ref 0.3–1.2)
Total Protein: 5.4 g/dL — ABNORMAL LOW (ref 6.5–8.1)

## 2021-03-13 LAB — NA AND K (SODIUM & POTASSIUM), RAND UR
Potassium Urine: 32 mmol/L
Sodium, Ur: 48 mmol/L

## 2021-03-13 LAB — GLUCOSE, CAPILLARY
Glucose-Capillary: 153 mg/dL — ABNORMAL HIGH (ref 70–99)
Glucose-Capillary: 212 mg/dL — ABNORMAL HIGH (ref 70–99)
Glucose-Capillary: 217 mg/dL — ABNORMAL HIGH (ref 70–99)
Glucose-Capillary: 276 mg/dL — ABNORMAL HIGH (ref 70–99)

## 2021-03-13 LAB — CREATININE, URINE, RANDOM: Creatinine, Urine: 133.07 mg/dL

## 2021-03-13 IMAGING — CT CT HEAD W/O CM
4 series · 16 of 47 positions shown, 18 images · non-contrast
Comparison: [DATE]

CLINICAL DATA: Follow-up subarachnoid hemorrhage

EXAM:
CT HEAD WITHOUT CONTRAST
TECHNIQUE: Contiguous axial images were obtained from the base of the skull
through the vertex without intravenous contrast.

[Series 2: head wo · axial · 0.52mm/px · z∈[-220,-75]mm · 7 of 39 slices shown, 9 images]
[im 5/39  brain]
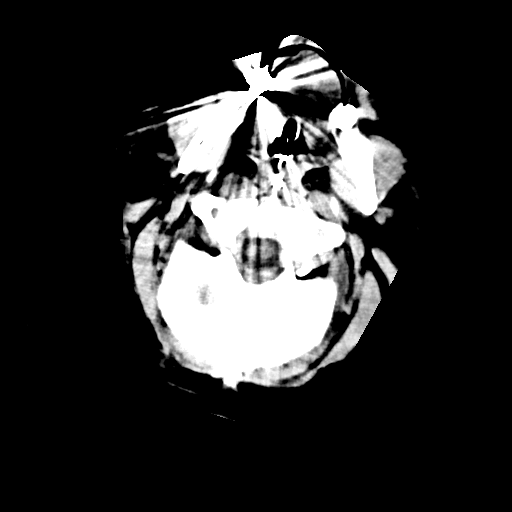
[im 5/39  bone]
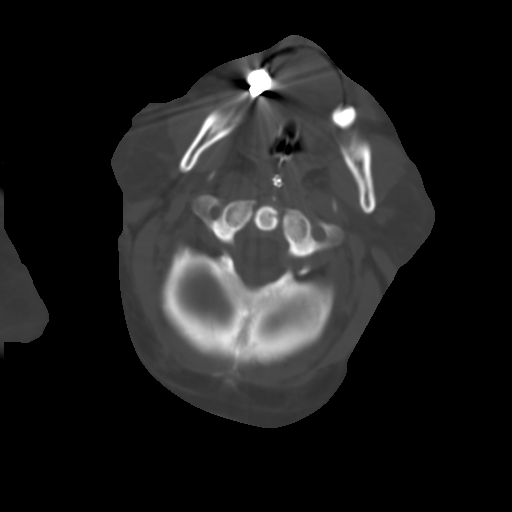
[im 10/39  brain]
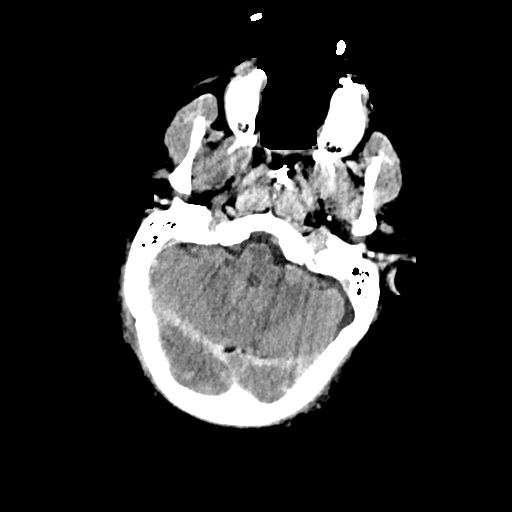
[im 15/39  brain]
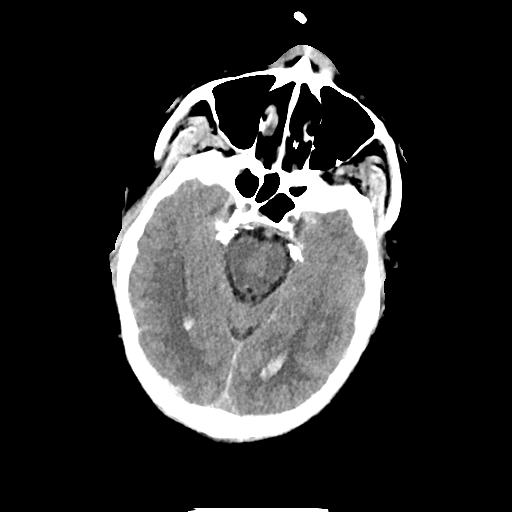
[im 20/39  brain]
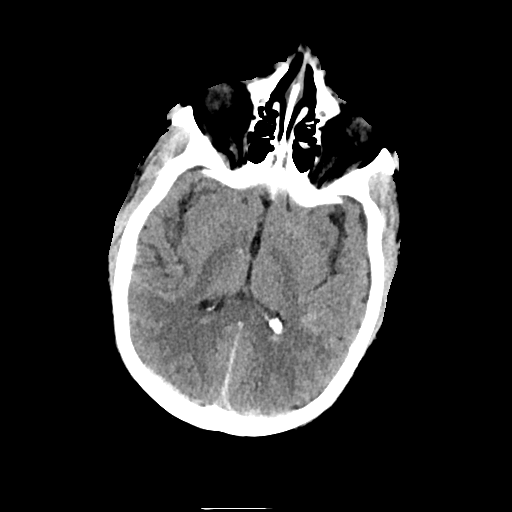
[im 24/39  brain]
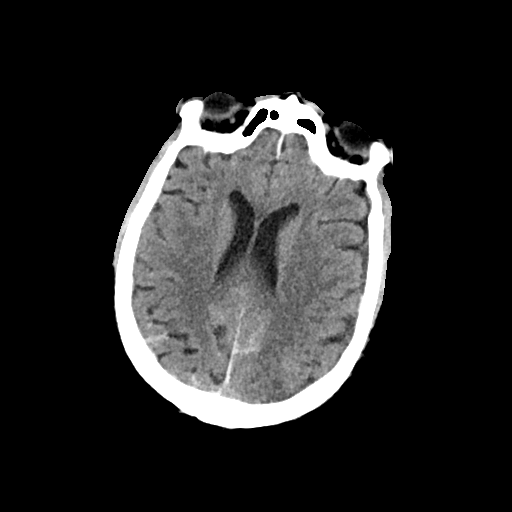
[im 24/39  bone]
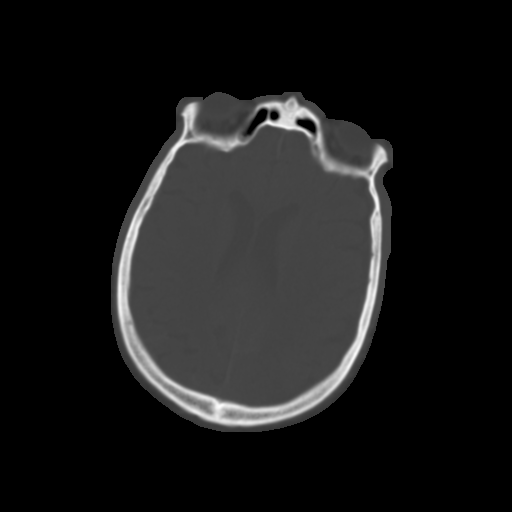
[im 29/39  brain]
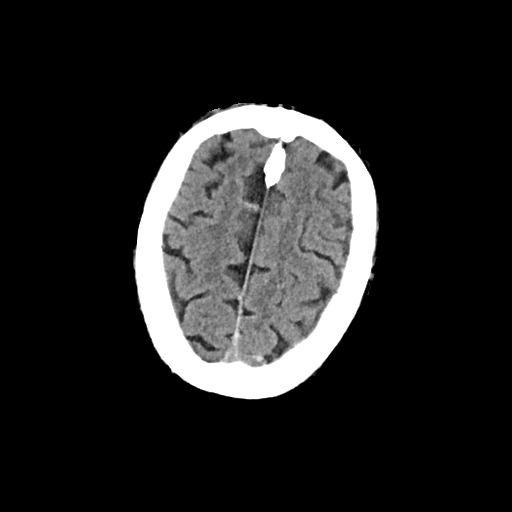
[im 34/39  brain]
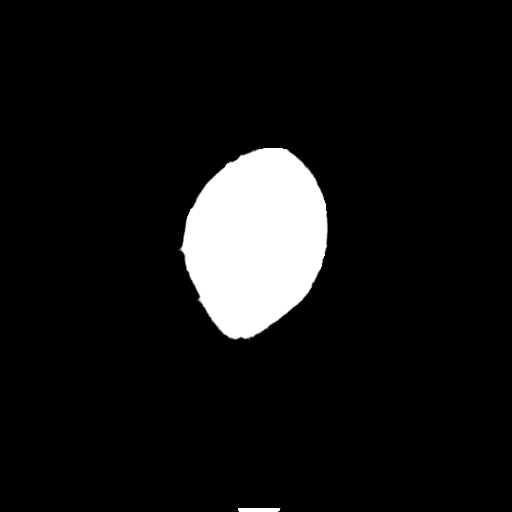

[Series 3: head bone · axial · 0.52mm/px · z∈[-222,-184]mm · 3 of 97 slices shown]
[im 10/97  bone]
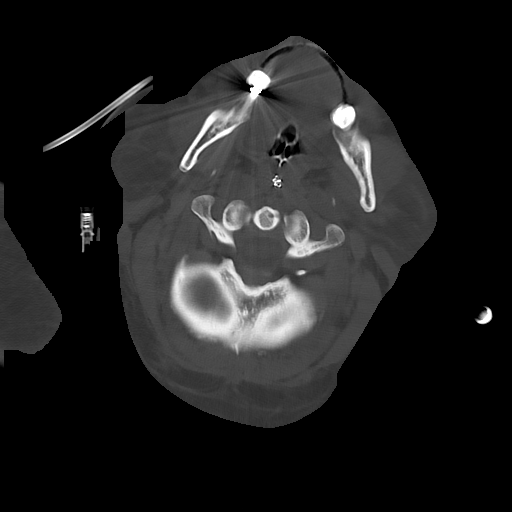
[im 20/97  bone]
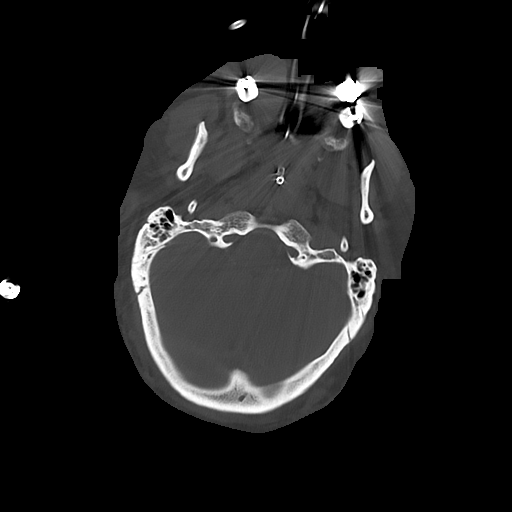
[im 29/97  bone]
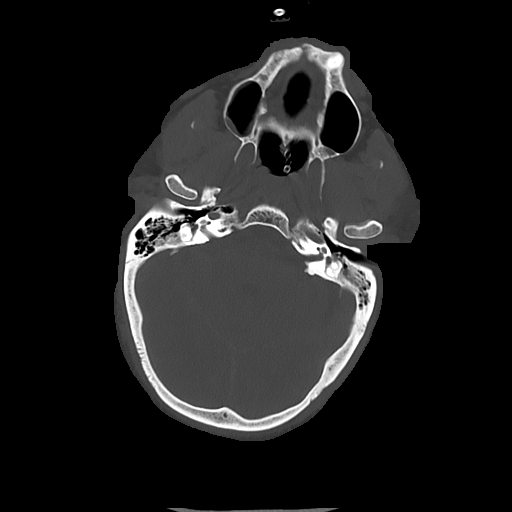

[Series 4: cor soft · coronal · 0.40mm/px · 3 of 83 slices shown]
[im 33/83  brain]
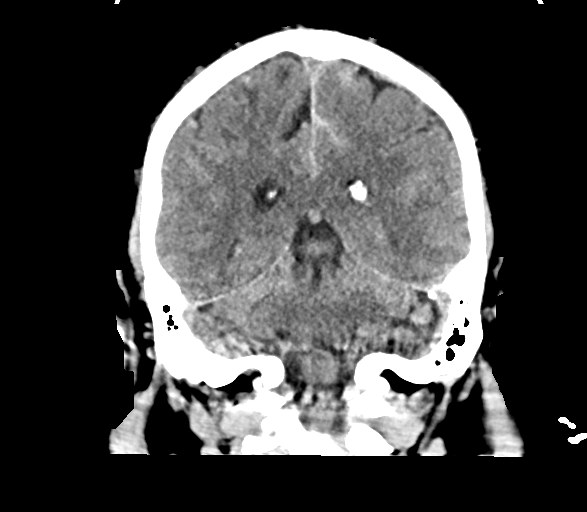
[im 40/83  brain]
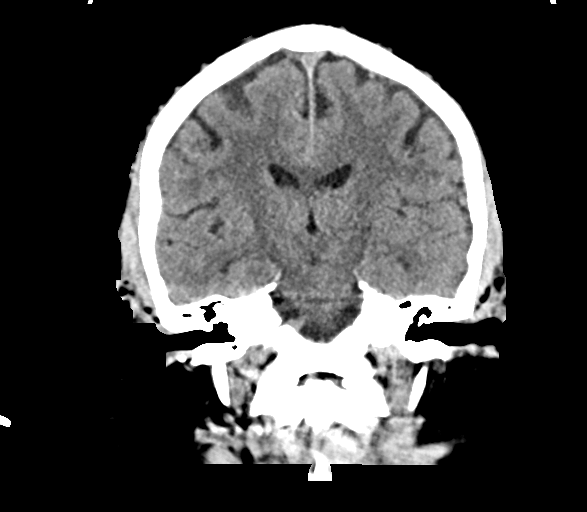
[im 46/83  brain]
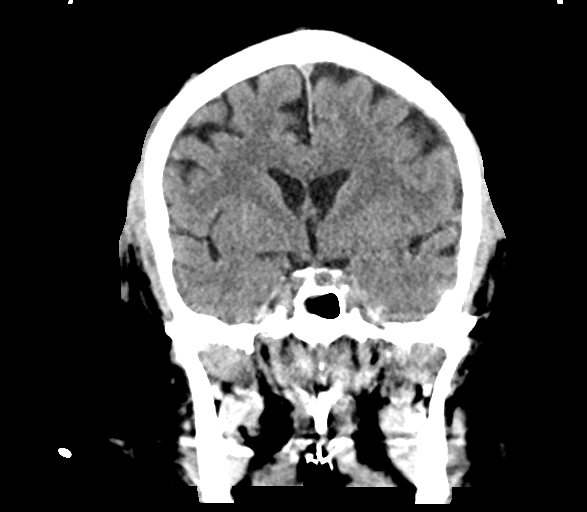

[Series 5: sag soft · sagittal · 0.44mm/px · 3 of 79 slices shown]
[im 27/79  brain]
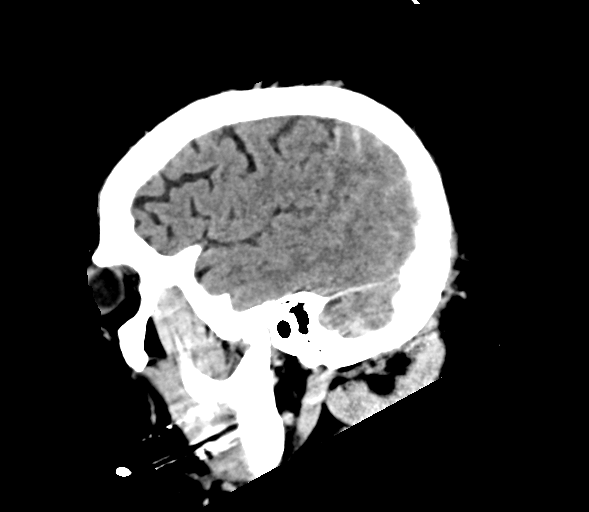
[im 40/79  brain]
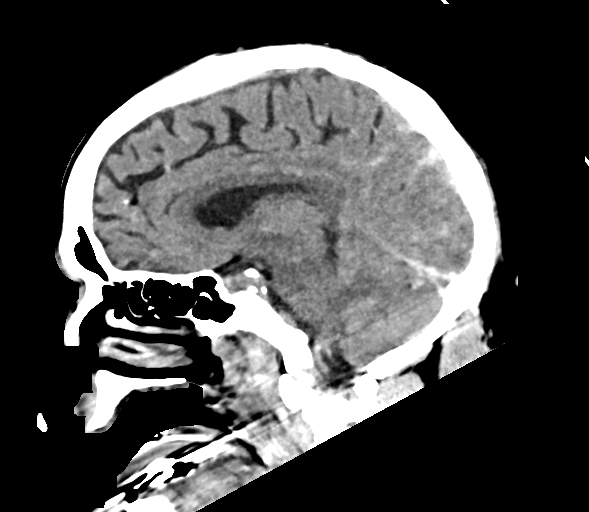
[im 53/79  brain]
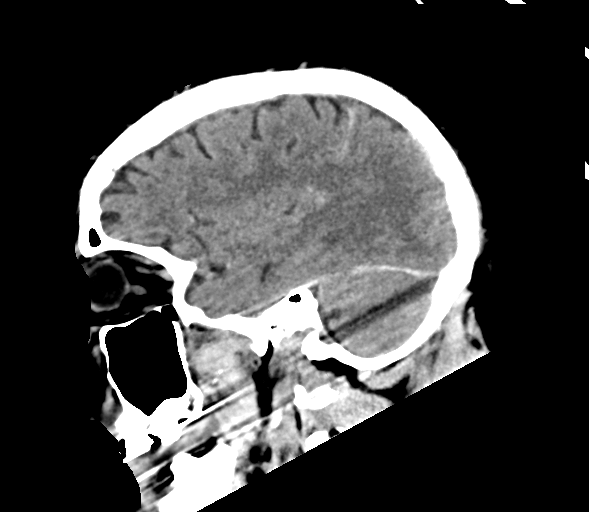

[16 of 47 positions shown; findings below may reference images not displayed]

FINDINGS: Brain: Subarachnoid hemorrhage again identified throughout the sulci
of the bilateral parietal and occipital lobes, unchanged since prior
exam. There is a small amount of intraventricular hemorrhage
layering dependently within the occipital horns of the lateral
ventricles, stable. Stable punctate hemorrhage within the right
thalamus. Decreased subarachnoid hemorrhage at the basilar cisterns.
Resolution of the subdural hematoma along the left frontal convexity
seen previously. No new areas of hemorrhage since prior exam. No
signs of acute infarct. Lateral ventricles and midline structures
are otherwise unremarkable. No mass effect.

Vascular: No hyperdense vessel or unexpected calcification.

Skull: Normal. Negative for fracture or focal lesion.

Sinuses/Orbits: No acute finding.

Other: None.
IMPRESSION: 1. Stable subarachnoid hemorrhage along the bilateral parietal and
occipital lobes.
2. Stable dependent intraventricular hemorrhage within the occipital
horns of the lateral ventricles.
3. Stable punctate hemorrhage within the right thalamus.
4. Decreased subarachnoid hemorrhage of the basilar cisterns, and
resolution of the left frontal convexity subdural hematoma seen
previously.
5. No evidence of acute infarct.

## 2021-03-13 MED ORDER — INSULIN ASPART 100 UNIT/ML IJ SOLN
5.0000 [IU] | INTRAMUSCULAR | Status: DC
  Administered 2021-03-13 (×2): 5 [IU] via SUBCUTANEOUS

## 2021-03-13 MED ORDER — CHLORHEXIDINE GLUCONATE CLOTH 2 % EX PADS: 6.0000 | MEDICATED_PAD | Freq: Every day | CUTANEOUS | Status: DC

## 2021-03-13 MED ORDER — DOCUSATE SODIUM 50 MG/5ML PO LIQD: 100.0000 mg | Freq: Two times a day (BID) | ORAL | Status: DC | PRN

## 2021-03-13 MED ORDER — POLYETHYLENE GLYCOL 3350 17 G PO PACK: 17.0000 g | PACK | Freq: Every day | ORAL | Status: DC | PRN

## 2021-03-14 ENCOUNTER — Other Ambulatory Visit (HOSPITAL_COMMUNITY): Payer: Medicare HMO

## 2021-03-15 MED FILL — Medication: Qty: 1 | Status: AC

## 2021-03-16 ENCOUNTER — Telehealth: Payer: Self-pay | Admitting: Critical Care Medicine

## 2021-03-16 NOTE — Telephone Encounter (Signed)
Patient's son, Connor [name], hand carried FMLA forms to [facility] office for completion so he can get paid for the time he was off taking care of his father.  Patient was never seen in [facility] office, only seen by our providers in the hospital.  I have emailed Dr. Delton Coombes and Dr. Tonia Brooms to see if either will be willing to sign the form.  Patient's PCP would not sign the form and told Mr. Riolo to bring them to [place].

## 2021-03-17 NOTE — Progress Notes (Signed)
Received notification that the patient will not be an ME case. The Son Kathlene November was notified and given the option for an autopsy. The family declined the autopsy.

## 2021-03-17 NOTE — Progress Notes (Signed)
Dr. Gaynell Face and myself had an in depth conversation with the family in regards to the patients current status. We discussed code status and the possible need for dialysis. The family voiced understanding.

## 2021-03-17 NOTE — Progress Notes (Signed)
Patient SpO2 dropped to the 70's Dr. Gaynell Face was at the bedside. RT was notified and FiO2 was increased and Chest Xray was obtained. Family was notified. Will continue to monitor.

## 2021-03-17 NOTE — Progress Notes (Signed)
eLink Physician-Brief Progress Note Patient Name: Connor [name] DOB: [birth year] MRN: [id number]   Date of Service  02/20/2021  HPI/Events of Note  RN asking for order for flexiseal due to large amounts of liquid stool  eICU Interventions  Fecal management system ordered     Intervention Category Minor Interventions: Other:  Darl Pikes 02/22/2021, 12:08 AM

## 2021-03-17 NOTE — Progress Notes (Signed)
Medical examiner was called and honorbride was notified as well. I spoke with Ranae Palms patient is holding for possible tissue donation. Reference number is [id number] [id number].

## 2021-03-17 NOTE — Progress Notes (Signed)
Attempted lower extremity venous duplex, however patient is in CT. Will attempt again as schedule permits.  2021-04-12 2:51 PM Eula Fried., MHA, RVT, RDCS, RDMS

## 2021-03-17 NOTE — Consult Note (Signed)
Reason for Consult: Renal failure Referring Physician:  Dr. Audria Nine  Chief Complaint: Headaches and altered mental status  Assessment/Plan: Renal failure - in the setting of SAH w/ hypotension noted on 9/24-9/25 with a drop to as low as 94/35 (one reading of 55/31 which is not clear if accurate).  Patient is positive 9L during this hospitalization. Certainly has reasons to be in ATN with profound hypotension but fortunately UOP remains very good.  - no e/o obstruction on renal ultrasound - will check urine studies - No absolute indication for dialysis but headed in that direction if family desires. Most likely patient is in ATN from relative + absolute hypotension (from 9/24-9/25) + initial insult from severe hypertension. - Would have family discussion and also determine neurologic status prior to offering RRT.  Hypernatremia - free water deficit of 5.5L + any ongoing free water losses. - If full correction is desired then would need 166m of free water /hr IV + the 2021mfree water through the GI tract + any ongoing free water losses to correct to 145. Chronic and would require slow correction to decr risk of cerebral edema. SAH - stable and actually improvement on repeat scan.   HTN p/w hypertensive emergency but currently at goal. Acute hypotension - better c/w 9/24-9/25 and off Levophed. @ goal at this time.  DM   HPI: Connor [name] an [age].o. male morbid obesity, IDDM, HTN,  HLD, pulmonary nodules presenting with  onset of severe headaches progressing to nausea, vomiting, left facial droop with severe hypertension noted with systolics in the [address]28-315'VPatient also had declining level of consciousness with CT head showing a SAH without ventricular extension and the patient was also started on Cleviprex. Patient was admitted on 9/19. Patient was intubated for airway control and sedated. CT angiogram was negative for an aneurysm and there was no hydrocephalus. MRI showed extensive  SAH but more pronounced at the basal cisterns with intraventricular extension. Failed an extubation trial because of poor responsiveness but repeat CT scan showed improvement in the subarachnoid blood. UOP remains very good; patient was only on Levophed on 9/24-9/25. Renal function had initially improved but then when rechecked was markedly elevated.  Patient also febrile. Ultrasound on 03/11/21 was negative for hydronephrosis.  7559mf contrast was given for the CT angiogram on 03/08/2021. Patient is on free water 200 ml q6hrs; Unasyn from  9/22-9/25.  ROS Pertinent items are noted in HPI.  Chemistry and CBC: Creatinine, Ser  Date/Time Value Ref Range Status  09/07-Apr-2021:48 AM 5.86 (H) 0.61 - 1.24 mg/dL Final  03/12/2021 05:38 AM 4.94 (H) 0.61 - 1.24 mg/dL Final  03/11/2021 10:19 PM 4.87 (H) 0.61 - 1.24 mg/dL Final  03/11/2021 04:10 PM 4.77 (H) 0.61 - 1.24 mg/dL Final  03/11/2021 09:06 AM 4.14 (H) 0.61 - 1.24 mg/dL Final  03/08/2021 01:51 AM 1.36 (H) 0.61 - 1.24 mg/dL Final  03/07/2021 03:57 AM 1.53 (H) 0.61 - 1.24 mg/dL Final  03/06/2021 10:32 AM 1.52 (H) 0.61 - 1.24 mg/dL Final  03/06/2021 01:52 AM 1.48 (H) 0.61 - 1.24 mg/dL Final    Comment:    DELTA CHECK NOTED  02/18/2021 11:29 AM 1.34 (H) 0.61 - 1.24 mg/dL Final  03/03/2021 08:17 AM 1.20 0.61 - 1.24 mg/dL Final  03/09/2021 08:10 AM 1.39 (H) 0.61 - 1.24 mg/dL Final   Recent Labs  Lab 03/06/21 1639 03/06/21 1946 03/07/21 0357 03/07/21 0752 03/07/21 1514 03/07/21 1956 03/08/21 0151 03/08/21 1139 03/10/21 1824 03/10/21 2044 03/11/21  7829 03/11/21 1610 03/11/21 2219 03/12/21 0538 03/31/2021 0548  NA  --    < > 150*   < > 154*   < > 151*  151*   < > 157* 156* 154* 153* 151* 151* 150*  K  --    < > 4.3  --   --   --  4.1   < > 3.5 3.4* 3.5 3.4* 3.8 3.7 3.8  CL  --   --  122*  --   --   --  122*  --   --   --  128* 126* 124* 124* 123*  CO2  --   --  19*  --   --   --  20*  --   --   --  17* 15* 15* 15* 13*  GLUCOSE  --   --   269*  --   --   --  175*  --   --   --  143* 300* 293* 313* 206*  BUN  --   --  16  --   --   --  13  --   --   --  64* 73* 77* 86* 100*  CREATININE  --   --  1.53*  --   --   --  1.36*  --   --   --  4.14* 4.77* 4.87* 4.94* 5.86*  CALCIUM  --   --  9.0  --   --   --  9.5  --   --   --  9.0 8.7* 8.8* 9.0 9.3  PHOS 2.1*  --  2.6  --  2.8  --  3.1  --   --   --   --   --   --   --   --    < > = values in this interval not displayed.   Recent Labs  Lab 03/09/21 0537 03/10/21 0022 03/10/21 1611 03/10/21 1824 03/10/21 2044 03/11/21 0906 31-Mar-2021 0548  WBC 15.2* 17.0*  --   --   --  13.1* 17.4*  NEUTROABS  --   --   --   --   --   --  13.9*  HGB 17.1* 17.8*   < > 16.3 15.3 15.3 14.4  HCT 50.8 52.1*   < > 48.0 45.0 46.8 44.8  MCV 96.9 99.0  --   --   --  98.3 98.9  PLT 102* 125*  --   --   --  125* 96*   < > = values in this interval not displayed.   Liver Function Tests: Recent Labs  Lab 03-31-21 0548  AST 77*  ALT 23  ALKPHOS 35*  BILITOT 0.8  PROT 5.4*  ALBUMIN 2.1*   No results for input(s): LIPASE, AMYLASE in the last 168 hours. No results for input(s): AMMONIA in the last 168 hours. Cardiac Enzymes: No results for input(s): CKTOTAL, CKMB, CKMBINDEX, TROPONINI in the last 168 hours. Iron Studies: No results for input(s): IRON, TIBC, TRANSFERRIN, FERRITIN in the last 72 hours. PT/INR: '@LABRCNTIP' (inr:5)  Xrays/Other Studies: ) Results for orders placed or performed during the hospital encounter of 02/19/2021 (from the past 48 hour(s))  Glucose, capillary     Status: Abnormal   Collection Time: 03/11/21  3:23 PM  Result Value Ref Range   Glucose-Capillary 226 (H) 70 - 99 mg/dL    Comment: Glucose reference range applies only to samples taken after fasting for at least 8 hours.  Basic  metabolic panel     Status: Abnormal   Collection Time: 03/11/21  4:10 PM  Result Value Ref Range   Sodium 153 (H) 135 - 145 mmol/L   Potassium 3.4 (L) 3.5 - 5.1 mmol/L   Chloride 126 (H)  98 - 111 mmol/L   CO2 15 (L) 22 - 32 mmol/L   Glucose, Bld 300 (H) 70 - 99 mg/dL    Comment: Glucose reference range applies only to samples taken after fasting for at least 8 hours.   BUN 73 (H) 8 - 23 mg/dL   Creatinine, Ser 4.77 (H) 0.61 - 1.24 mg/dL   Calcium 8.7 (L) 8.9 - 10.3 mg/dL   GFR, Estimated 12 (L) >60 mL/min    Comment: (NOTE) Calculated using the CKD-EPI Creatinine Equation (2021)    Anion gap 12 5 - 15    Comment: Performed at [facility] [address] [place], [place] [address]  Glucose, capillary     Status: Abnormal   Collection Time: 03/11/21  7:59 PM  Result Value Ref Range   Glucose-Capillary 241 (H) 70 - 99 mg/dL    Comment: Glucose reference range applies only to samples taken after fasting for at least 8 hours.  Basic metabolic panel     Status: Abnormal   Collection Time: 03/11/21 10:19 PM  Result Value Ref Range   Sodium 151 (H) 135 - 145 mmol/L   Potassium 3.8 3.5 - 5.1 mmol/L   Chloride 124 (H) 98 - 111 mmol/L   CO2 15 (L) 22 - 32 mmol/L   Glucose, Bld 293 (H) 70 - 99 mg/dL    Comment: Glucose reference range applies only to samples taken after fasting for at least 8 hours.   BUN 77 (H) 8 - 23 mg/dL   Creatinine, Ser 4.87 (H) 0.61 - 1.24 mg/dL   Calcium 8.8 (L) 8.9 - 10.3 mg/dL   GFR, Estimated 12 (L) >60 mL/min    Comment: (NOTE) Calculated using the CKD-EPI Creatinine Equation (2021)    Anion gap 12 5 - 15    Comment: Performed at [facility] [address], [place], [place] [address]  Glucose, capillary     Status: Abnormal   Collection Time: 03/11/21 11:41 PM  Result Value Ref Range   Glucose-Capillary 247 (H) 70 - 99 mg/dL    Comment: Glucose reference range applies only to samples taken after fasting for at least 8 hours.  Glucose, capillary     Status: Abnormal   Collection Time: 03/12/21  3:21 AM  Result Value Ref Range   Glucose-Capillary 259 (H) 70 - 99 mg/dL    Comment: Glucose reference range applies only to  samples taken after fasting for at least 8 hours.  Basic metabolic panel     Status: Abnormal   Collection Time: 03/12/21  5:38 AM  Result Value Ref Range   Sodium 151 (H) 135 - 145 mmol/L   Potassium 3.7 3.5 - 5.1 mmol/L   Chloride 124 (H) 98 - 111 mmol/L   CO2 15 (L) 22 - 32 mmol/L   Glucose, Bld 313 (H) 70 - 99 mg/dL    Comment: Glucose reference range applies only to samples taken after fasting for at least 8 hours.   BUN 86 (H) 8 - 23 mg/dL   Creatinine, Ser 4.94 (H) 0.61 - 1.24 mg/dL   Calcium 9.0 8.9 - 10.3 mg/dL   GFR, Estimated 12 (L) >60 mL/min    Comment: (NOTE) Calculated using  the CKD-EPI Creatinine Equation (2021)    Anion gap 12 5 - 15    Comment: Performed at [facility], [place] [address] [place], [place] [address]  Glucose, capillary     Status: Abnormal   Collection Time: 03/12/21  7:42 AM  Result Value Ref Range   Glucose-Capillary 234 (H) 70 - 99 mg/dL    Comment: Glucose reference range applies only to samples taken after fasting for at least 8 hours.  Glucose, capillary     Status: Abnormal   Collection Time: 03/12/21 11:35 AM  Result Value Ref Range   Glucose-Capillary 251 (H) 70 - 99 mg/dL    Comment: Glucose reference range applies only to samples taken after fasting for at least 8 hours.  Glucose, capillary     Status: Abnormal   Collection Time: 03/12/21  3:51 PM  Result Value Ref Range   Glucose-Capillary 241 (H) 70 - 99 mg/dL    Comment: Glucose reference range applies only to samples taken after fasting for at least 8 hours.  Glucose, capillary     Status: Abnormal   Collection Time: 03/12/21  5:11 PM  Result Value Ref Range   Glucose-Capillary 251 (H) 70 - 99 mg/dL    Comment: Glucose reference range applies only to samples taken after fasting for at least 8 hours.  Glucose, capillary     Status: Abnormal   Collection Time: 03/12/21  7:44 PM  Result Value Ref Range   Glucose-Capillary 220 (H) 70 - 99 mg/dL    Comment: Glucose reference  range applies only to samples taken after fasting for at least 8 hours.  Glucose, capillary     Status: Abnormal   Collection Time: 03/12/21 11:28 PM  Result Value Ref Range   Glucose-Capillary 210 (H) 70 - 99 mg/dL    Comment: Glucose reference range applies only to samples taken after fasting for at least 8 hours.  Glucose, capillary     Status: Abnormal   Collection Time: 03-15-2021  3:20 AM  Result Value Ref Range   Glucose-Capillary 153 (H) 70 - 99 mg/dL    Comment: Glucose reference range applies only to samples taken after fasting for at least 8 hours.  Comprehensive metabolic panel     Status: Abnormal   Collection Time: 03/15/2021  5:48 AM  Result Value Ref Range   Sodium 150 (H) 135 - 145 mmol/L   Potassium 3.8 3.5 - 5.1 mmol/L   Chloride 123 (H) 98 - 111 mmol/L   CO2 13 (L) 22 - 32 mmol/L   Glucose, Bld 206 (H) 70 - 99 mg/dL    Comment: Glucose reference range applies only to samples taken after fasting for at least 8 hours.   BUN 100 (H) 8 - 23 mg/dL   Creatinine, Ser 5.86 (H) 0.61 - 1.24 mg/dL   Calcium 9.3 8.9 - 10.3 mg/dL   Total Protein 5.4 (L) 6.5 - 8.1 g/dL   Albumin 2.1 (L) 3.5 - 5.0 g/dL   AST 77 (H) 15 - 41 U/L   ALT 23 0 - 44 U/L   Alkaline Phosphatase 35 (L) 38 - 126 U/L   Total Bilirubin 0.8 0.3 - 1.2 mg/dL   GFR, Estimated 10 (L) >60 mL/min    Comment: (NOTE) Calculated using the CKD-EPI Creatinine Equation (2021)    Anion gap 14 5 - 15    Comment: Performed at [facility], [place] [address], [place], [place] [address]  CBC with Differential/Platelet  Status: Abnormal   Collection Time: Mar 30, 2021  5:48 AM  Result Value Ref Range   WBC 17.4 (H) 4.0 - 10.5 K/uL   RBC 4.53 4.22 - 5.81 MIL/uL   Hemoglobin 14.4 13.0 - 17.0 g/dL   HCT 44.8 39.0 - 52.0 %   MCV 98.9 80.0 - 100.0 fL   MCH 31.8 26.0 - 34.0 pg   MCHC 32.1 30.0 - 36.0 g/dL   RDW 15.6 (H) 11.5 - 15.5 %   Platelets 96 (L) 150 - 400 K/uL    Comment: Immature Platelet Fraction may  be clinically indicated, consider ordering this additional test [id number] CONSISTENT WITH PREVIOUS RESULT REPEATED TO VERIFY    nRBC 0.0 0.0 - 0.2 %   Neutrophils Relative % 79 %   Neutro Abs 13.9 (H) 1.7 - 7.7 K/uL   Lymphocytes Relative 7 %   Lymphs Abs 1.2 0.7 - 4.0 K/uL   Monocytes Relative 10 %   Monocytes Absolute 1.7 (H) 0.1 - 1.0 K/uL   Eosinophils Relative 2 %   Eosinophils Absolute 0.4 0.0 - 0.5 K/uL   Basophils Relative 1 %   Basophils Absolute 0.1 0.0 - 0.1 K/uL   Immature Granulocytes 1 %   Abs Immature Granulocytes 0.19 (H) 0.00 - 0.07 K/uL    Comment: Performed at [facility], [address], [place], [place] [address]  Glucose, capillary     Status: Abnormal   Collection Time: March 30, 2021  7:52 AM  Result Value Ref Range   Glucose-Capillary 212 (H) 70 - 99 mg/dL    Comment: Glucose reference range applies only to samples taken after fasting for at least 8 hours.  Glucose, capillary     Status: Abnormal   Collection Time: 03/30/2021 11:36 AM  Result Value Ref Range   Glucose-Capillary 217 (H) 70 - 99 mg/dL    Comment: Glucose reference range applies only to samples taken after fasting for at least 8 hours.   US RENAL  Result Date: 03/11/2021 CLINICAL DATA:  Acute kidney injury. EXAM: RENAL / URINARY TRACT ULTRASOUND COMPLETE COMPARISON:  None. FINDINGS: Right Kidney: Renal measurements: 12.4 x 7.0 x 6.2 cm = volume: 280 mL. Echogenicity within normal limits. No mass or hydronephrosis visualized. Left Kidney: Renal measurements: 12.6 x 7.3 x 6.2 cm = volume: 299 mL. 1.8 cm simple cyst is noted in upper pole. Echogenicity within normal limits. No mass or hydronephrosis visualized. Bladder: Decompressed secondary to Foley catheter. Other: None. IMPRESSION: No significant renal abnormality is noted. Electronically Signed   By: Marijo Conception M.D.   On: 03/11/2021 20:21   VAS [place] TRANSCRANIAL DOPPLER  Result Date: 03/12/2021  Transcranial Doppler Patient Name:  Connor [name]  Date of Exam:   03/12/2021 Medical Rec #: [id number]       Accession #:    [id number] Date of Birth: Dec 29, 1945       Patient Gender: M Patient Age:   75 years Exam Location:  [facility] Procedure:      VAS [place] TRANSCRANIAL DOPPLER Referring Phys: Cornelius Moras XU --------------------------------------------------------------------------------  Indications: Subarachnoid hemorrhage. Limitations: Difficult study due to patient breathing (on vent), and inability              to cooperate with exam (became agitated while examining left side). Performing Technologist: Rogelia Rohrer RVT, RDMS  Examination Guidelines: A complete evaluation includes B-mode imaging, spectral Doppler, color Doppler, and power Doppler as needed of all accessible portions of each vessel. Bilateral testing is considered  an integral part of a complete examination. Limited examinations for reoccurring indications may be performed as noted.  +----------+-------------+----------+-----------+------------------+ RIGHT TCD Right VM (cm)Depth (cm)Pulsatility     Comment       +----------+-------------+----------+-----------+------------------+ MCA           77.00       5.60      1.97                       +----------+-------------+----------+-----------+------------------+ ACA          -37.00       6.80      1.73                       +----------+-------------+----------+-----------+------------------+ Term ICA      33.00       6.60      1.81                       +----------+-------------+----------+-----------+------------------+ PCA           35.00       6.00      1.94    reversal of flow?  +----------+-------------+----------+-----------+------------------+ Opthalmic     20.00       3.60      1.71                       +----------+-------------+----------+-----------+------------------+ ICA siphon                                  unable to insonate  +----------+-------------+----------+-----------+------------------+ Vertebral    -26.00       6.70      3.00                       +----------+-------------+----------+-----------+------------------+ Distal ICA    41.00       4.90      1.64                       +----------+-------------+----------+-----------+------------------+  +----------+------------+----------+-----------+------------------+ LEFT TCD  Left VM (cm)Depth (cm)Pulsatility     Comment       +----------+------------+----------+-----------+------------------+ MCA          69.00       5.90      1.83                       +----------+------------+----------+-----------+------------------+ ACA                                        unable to insonate +----------+------------+----------+-----------+------------------+ Term ICA     54.00       6.60      1.74                       +----------+------------+----------+-----------+------------------+ PCA          33.00       6.80      1.50                       +----------+------------+----------+-----------+------------------+ Opthalmic    17.00       3.40      1.61                       +----------+------------+----------+-----------+------------------+  ICA siphon                                 unable to insonate +----------+------------+----------+-----------+------------------+ Vertebral    -24.00      6.40      1.71                       +----------+------------+----------+-----------+------------------+ Distal ICA   59.00       5.10      1.53                       +----------+------------+----------+-----------+------------------+  +------------+------+------------------+             VM cm      Comment       +------------+------+------------------+ Prox Basilar-45.00                   +------------+------+------------------+ Dist Basilar      unable to insonate +------------+------+------------------+  +----------------------+----+ Right Lindegaard Ratio1.88 +----------------------+----+ +---------------------+----+ Left Lindegaard Ratio1.17 +---------------------+----+    Preliminary     PMH:   Past Medical History:  Diagnosis Date   Arthritis    knees   Diabetes mellitus without complication (HCC)    Hypertension     PSH:  No past surgical history on file.  Allergies: No Known Allergies  Medications:   Prior to Admission medications   Medication Sig Start Date End Date Taking? Authorizing Provider  aspirin EC 81 MG tablet Take 81 mg by mouth daily.   Yes [provider]  atorvastatin (LIPITOR) 80 MG tablet Take 80 mg by mouth daily.   Yes [provider]  brimonidine (ALPHAGAN) 0.15 % ophthalmic solution Place 1 drop into both eyes 2 (two) times daily. 01/04/21  Yes [provider]  Cholecalciferol 25 MCG (1000 UT) tablet Take 1,000 Units by mouth daily. 12/15/15  Yes [provider]  dapagliflozin propanediol (FARXIGA) 10 MG TABS tablet Take 1 tablet (10 mg total) by mouth daily before breakfast. 03/20/20  Yes Renato Shin, MD  dorzolamide (TRUSOPT) 2 % ophthalmic solution Place 1 drop into both eyes 2 (two) times daily. 10/06/20  Yes [provider]  hydrALAZINE (APRESOLINE) 25 MG tablet Take 25 mg by mouth 3 (three) times daily. 12/25/20  Yes [provider]  insulin aspart (NOVOLOG FLEXPEN) 100 UNIT/ML FlexPen 3 times a day (just before each meal) 05-27-11 units Patient taking differently: Inject 10-12 Units into the skin See admin instructions. 12 units in the morning 10 units midday 12 units in the afternoon 08/24/20  Yes Renato Shin, MD  insulin glargine (LANTUS) 100 UNIT/ML injection Inject 0.35 mLs (35 Units total) into the skin at bedtime. 05/19/20  Yes Renato Shin, MD  losartan (COZAAR) 100 MG tablet Take 100 mg by mouth daily.   Yes [provider]  metFORMIN (GLUCOPHAGE-XR) 500 MG 24 hr tablet Take 4  tablets (2,000 mg total) by mouth daily with breakfast. 08/24/20  Yes Renato Shin, MD  metoprolol tartrate (LOPRESSOR) 50 MG tablet Take 50 mg by mouth 2 (two) times daily.   Yes [provider]  Multiple Vitamins-Minerals (MULTIVITAMIN WITH MINERALS) tablet Take 1 tablet by mouth daily.   Yes [provider]  sildenafil (VIAGRA) 100 MG tablet Take 100 mg by mouth daily as needed for erectile dysfunction. 10/25/20  Yes [provider]  silodosin (RAPAFLO) 8 MG CAPS capsule Take 8 mg  by mouth daily. 01/16/21  Yes [provider]  testosterone cypionate (DEPOTESTOSTERONE CYPIONATE) 200 MG/ML injection Inject 200 mg into the muscle every 30 (thirty) days. 01/17/21  Yes [provider]    Discontinued Meds:   Medications Discontinued During This Encounter  Medication Reason   labetalol (NORMODYNE) injection 20 mg    levETIRAcetam (KEPPRA) 2,640 mg in sodium chloride 0.9 % 250 mL IVPB    senna-docusate (Senokot-S) tablet 1 tablet    traMADol (ULTRAM) tablet 50 mg    0.9 %  sodium chloride infusion    METOPROLOL TARTRATE PO    Vitamin D, Ergocalciferol, (DRISDOL) 1.25 MG (50000 UT) CAPS capsule    glucose 4 GM chewable tablet    hydrALAZINE (APRESOLINE) 100 MG tablet    niMODipine (NIMOTOP) capsule 30 mg    niMODipine (NYMALIZE) 6 MG/ML oral solution 30 mg    sodium chloride (hypertonic) 3 % solution    cloNIDine (CATAPRES) tablet 0.2 mg    propofol (DIPRIVAN) 1000 MG/100ML infusion    fentaNYL 252mg in NS 2519m(1056mml) infusion-PREMIX    fentaNYL (SUBLIMAZE) bolus via infusion 25-100 mcg    chlorhexidine gluconate (MEDLINE KIT) (PERIDEX) 0.12 % solution 15 mL    insulin detemir (LEVEMIR) injection 15 Units    cefTRIAXone (ROCEPHIN) 2 g in sodium chloride 0.9 % 100 mL IVPB    docusate (COLACE) 50 MG/5ML liquid 100 mg    polyethylene glycol (MIRALAX / GLYCOLAX) packet 17 g    pantoprazole (PROTONIX) injection 40 mg    insulin detemir (LEVEMIR)  injection 18 Units    feeding supplement (PROSource TF) liquid 90 mL    insulin aspart (novoLOG) injection 0-15 Units    insulin detemir (LEVEMIR) injection 30 Units    hydrALAZINE (APRESOLINE) tablet 10 mg    lisinopril (ZESTRIL) tablet 40 mg    labetalol (NORMODYNE) injection 10 mg    dexmedetomidine (PRECEDEX) 400 MCG/100ML (4 mcg/mL) infusion    cloNIDine (CATAPRES) tablet 0.3 mg    insulin aspart (novoLOG) injection 0-20 Units    insulin detemir (LEVEMIR) injection 40 Units    succinylcholine (ANECTINE) 200 MG/10ML syringe Returned to ADS   dexmedetomidine (PRECEDEX) 400 MCG/100ML (4 mcg/mL) infusion    midazolam (VERSED) 2 MG/2ML injection Returned to ADS   phenylephrine 0.4-0.9 MG/10ML-% injection Returned to ADS   clevidipine (CLEVIPREX) infusion 0.5 mg/mL    minoxidil (LONITEN) tablet 2.5 mg    insulin regular, human (MYXREDLIN) 100 units/ 100 mL infusion    levETIRAcetam (KEPPRA) IVPB 1000 mg/100 mL premix    dorzolamide (TRUSOPT) 2 % ophthalmic solution 1 drop    Ampicillin-Sulbactam (UNASYN) 3 g in sodium chloride 0.9 % 100 mL IVPB    MEDLINE mouth rinse Duplicate   dextrose 5 % in lactated ringers infusion    lactated ringers infusion    brimonidine (ALPHAGAN) 0.15 % ophthalmic solution 1 drop    feeding supplement (OSMOLITE 1.5 CAL) liquid 1,000 mL    feeding supplement (OSMOLITE 1.5 CAL) liquid 1,000 mL    losartan (COZAAR) tablet 100 mg    potassium chloride (KLOR-CON) packet 60 mEq    insulin detemir (LEVEMIR) injection 30 Units    propofol (DIPRIVAN) 1000 MG/100ML infusion    norepinephrine (LEVOPHED) 4mg35m 250mL1mmix infusion    feeding supplement (OSMOLITE 1.5 CAL) liquid 1,000 mL    Chlorhexidine Gluconate Cloth 2 % PADS 6 each    docusate (COLACE) 50 MG/5ML liquid 100 mg    polyethylene glycol (MIRALAX / GLYCOLAX)  packet 17 g    insulin aspart (novoLOG) injection 3 Units     Social History:  reports that he quit smoking about 27 years ago. His smoking  use included cigarettes. He has a 6.00 pack-year smoking history. He has never used smokeless tobacco. He reports that he does not drink alcohol and does not use drugs.  Family History:  No family history on file.  Blood pressure (!) 161/45, pulse 93, temperature (!) 100.4 F (38 C), temperature source Axillary, resp. rate (!) 27, height '5\' 10"'  (1.778 m), weight 129.4 kg, SpO2 100 %. df GEN: Very  ill appearing, obese, intubated HEENT: No conjunctival pallor, pupils sluggish NECK: Supple, no thyromegaly LUNGS: CTA B/L no rales CV: RRR ABD: Morbidly obese, SNDNT decr BS  EXT: 1+  lower extremity edema GU: Foley in place        Dwana Melena, MD 04/11/2021, 12:12 PM

## 2021-03-17 NOTE — Progress Notes (Signed)
OT Cancellation Note  Patient Details Name: Brenen Beigel MRN: [id number] DOB: 1946-05-14   Cancelled Treatment:    Reason Eval/Treat Not Completed: Other (comment) (defer session due to responsiveness and pending POC meeting. Will check back)  Wynona Neat, OTR/L  Acute Rehabilitation Services Pager: [phone] Office: [phone] .  03/10/2021, 1:44 PM

## 2021-03-17 NOTE — Procedures (Addendum)
Cardiopulmonary Resuscitation Note  Connor [name]  [id number]  [birth year]  Date:03/03/2021  Time of arrest: 18:45 Time of death: 19:00  Provider Performing:IMTS team, Lorin Glass   Procedure: Cardiopulmonary Resuscitation [id number])  Indication(s) Loss of Pulse  Consent N/A  Anesthesia N/A   Time Out N/A   Sterile Technique Hand hygiene, gloves   Procedure Description Called to patient's room for CODE BLUE. Initial rhythm was PEA/Asystole. Patient received high quality chest compressions for 15 minutes with defibrillation or cardioversion when appropriate. Epinephrine was administered every 3 minutes as directed by time Biomedical engineer. Additional pharmacologic interventions included amiodarone and sodium bicarbonate. Additional procedural interventions include bedside echo.    IMTS running code, recent events noted. High quality CPR x 15 mins with no ROSC +bilateral breath sounds with bagging Cardiac silence on tele and echo  Return of spontaneous circulation was not achieved.  Family called and notified. By IMTS team   Complications/Tolerance N/A   EBL N/A   Specimen(s) N/A  Estimated time to ROSC: N/Aminutes

## 2021-03-17 NOTE — Progress Notes (Signed)
NAMEJuwon Scripter, MRN:  [id number], DOB:  04-13-1946, LOS: 8 ADMISSION DATE:  02/26/2021, CONSULTATION DATE:  02/23/2021 REFERRING MD:  Thomasena Edis CHIEF COMPLAINT:  AMS   History of Present Illness:  Keona Sheffler is a 75 y.o. male who has a PMH as outlined below who presented to [facility] ED 9/19 as code stroke.  He woke up his normal self that morning but then had acute onset of severe headache. He called EMS and upon their arrival, he had AMS and left facial droop.  SBP was noted to be 270.  He was brought to ED where CT head demonstrated SAH.  He was intubated for airway protection and was started on Cleviprex for BP control with goal SBP 120 - 140.  Significant Hospital Events: Including procedures, antibiotic start and stop dates in addition to other pertinent events   9/19 > admit. 9/20 > transferred to [facility] service 9/21 > tolerated spontaneous breathing trial, extubated 9/22 > remain agitated requiring Precedex infusion 9/24 weaning precedex, escalating antihypertensive reg and diuresing. Continuing on unasyn -- wbc in rising to 17 9/25 placing an art line   Interim History / Subjective:  9/27: not opening eyes today. Only grimace to painful stim. Repeat cth ordered, and nephrology consult pending with progressive renal failure.  d/w family goals of care. They are considering code status and palliative consult.  9/26: tmax overnight 102.9, renal indices cont to rise at this time but uop adequate. Pt not responsive but will open eyes spontaneously. He is on full support with vent but appears to be abdominal breathing. Sats adequate. Tolerated cpap but still appears with increased wob. He has been off sedation since at least 9/25. Not ready for extubation.  Objective:  Blood pressure (!) 131/47, pulse 83, temperature 99.4 F (37.4 C), temperature source Axillary, resp. rate (!) 29, height 5\' 10"  (1.778 m), weight 129.4 kg, SpO2 99 %.    Vent Mode: PRVC FiO2 (%):  [40 %] 40 % Set Rate:  [22  bmp] 22 bmp Vt Set:  [580 mL] 580 mL PEEP:  [5 cmH20] 5 cmH20 Plateau Pressure:  [20 cmH20] 20 cmH20   Intake/Output Summary (Last 24 hours) at 02/27/2021 0846 Last data filed at 02/17/2021 0700 Gross per 24 hour  Intake 4676.87 ml  Output 1690 ml  Net 2986.87 ml   Filed Weights   03/09/21 0419 03/10/21 0500 03/11/21 0459  Weight: 130.1 kg 132.1 kg 129.4 kg    Examination:   Physical exam:   General: Critically ill appearing older adult M intubated not sedated NAD  HEENT: NCAT mmmp, perrla but very sluggish Neuro: grimace to painful stim but otherwise unable to elicit response.  Pulm: diminished bilaterally, mechanical sounds Heart: rrr s1s2 no rgm cap refill < 3 sec  Abdomen: Obese soft round. + bowel sounds x4  Skin: c/d/w no rash  Resolved Hospital Problem list:    Assessment & Plan:   Acute encephaloapthy SAH, AKI, lingering sedation in setting of AKI and prolonged precedex use  -now likely confounded by sedation, infection, delirium P -delirium precautions  -encephalopathy persists, cont to hold sedation. (Has been off for >48 hours) -repeat cth, ? If uremia playing role -may need to reach back out to neuro for prognostication as I do not see is mentioned in notes.   SAH, in setting of hypertensive emergency  -cta H/n, MRA without aneurysm P -SBP goal <160 -MWF TCDs -- no evidence of vasospasm thusfar    Acute hypoxic respiratory failure  requiring reintubation  Strep pneumoniae PNA Suspected aspiration  Pulmonary edema  Hx OSA noncompliant with CPAP  P -MV support -WUA/SBT as indicated -cont ctx for  total10 days with persistent fever.  -likely to need trach given mentation  -awaiting cth repeat and family discussion.   Poorly controlled HTN // Acute hypotension   P -remove a line -off sedation -off vasopressors.   AKI  Hypernatremia -FWF started a few days ago but Na not rechecked since P -foley -FWF -mIVF  -trend renal indices and  UOP -uop ok but indices cont to climb with worsening mentation today ? Uremia playing role.  -consult nephro at this time, certainly pt is not currently a long term dialysis candidate and this was clearly relayed to pt by me  DM2 with hyperglycemia -cont ssi, basal dosing BID and tf coverage  Abdominal distention -KUB with some concern for ileus -- EN was held as a result. Pt then had a large BM P -tolerating tf increases  Morbid obesity  -EN per RDN  LLE edema:  -LLE venous duplex pending   Best practice (evaluated daily):  Diet/type: EN per RDN DVT prophylaxis: SCD GI prophylaxis: PPI Lines: N/A Foley: Foley  Code Status:  full code Last date of multidisciplinary goals of care discussion: 9/27updated son via phone    CRITICAL CARE Performed by: Briant Sites   Critical care time: The patient is critically ill with multiple organ systems failure and requires high complexity decision making for assessment and support, frequent evaluation and titration of therapies, application of advanced monitoring technologies and extensive interpretation of multiple databases.  Critical care time . This represents my time independent of the NPs time taking care of the pt. This is excluding procedures.    Briant Sites DO [facility] 03/06/2021, 8:46 AM See Amion for pager If no response to pager, please call [phone] until 1900 After 1900 please call [facility] [phone]

## 2021-03-17 NOTE — Progress Notes (Signed)
Pt transported to CT and back to 4N22 without any complications.  

## 2021-03-17 NOTE — Death Summary Note (Signed)
DEATH SUMMARY   Patient Details  Name: Connor [name] MRN: [id number] DOB: 10-Jul-1945  Admission/Discharge Information   Admit Date:  Mar 15, 2021  Date of Death: Date of Death: 2021/03/23  Time of Death: Time of Death: 1900  Length of Stay: Nov 03, 2022  Referring [facility]: [facility]   Reason(s) for Hospitalization  HA and AMS  Diagnoses  Preliminary cause of death: Cardiac arrest [facility]) Secondary Diagnoses (including complications and co-morbidities):  Active Problems:   Encephalopathy acute   Hypertensive emergency   Acute respiratory failure with hypoxia [facility])   Subarachnoid hemorrhage   Brief Hospital Course (including significant findings, care, treatment, and services provided and events leading to death)  Connor [name] is a 75 y.o. year old male who per H&P on 2023/03/16 has a PMHx of morbid obesity, IDDM II, HTN, HLD, pulmonary nodules, and OA who presents as a code stroke. Per EMS, patient woke up his normal self, but at 0650 had acute onset of severe HA. He was alone. When EMS arrived HA remained and patient's mental status was normal for him. Afterwards, patient began to have n/v and EMS noted a left facial droop. BP per fire was 270s and BP per EMS 190s. CBG 227.    After brief exam on the ED bridge, patient was taken emergently for intubation in ED room. BP 170s. Propofol bolus'd with maintenance infusion started. CT head was + for [facility]. Cleviprex started with goal BP 120-140. Started patient on seizure prophylaxis at 1 gm IV q12 hours.    In review of chart, patient had UC visit in May 2022 for hypoglycemia. Last endocrinology visit note in 3/22 states patient's DM medicine doses were lowered.  Patient's BP was high on that day, but can not find exact reading.   Neurology was called to admit pt for [facility] and ccm for vent management as pt was encephalopathic and unable to protect airway resulting in acute hypoxic resp failure req mechanical ventilation. Neurosx was consulted as well  and no surgical intervention was deemed necessary they signed off on 9/20 as well as neuro 9/20. Pt was briefly extubated 9/21 but reportedly remained altered only intermittently and weakly following simple commands but mostly restless and agitated. On 9/24 his mental status had continued to deteriorate and he was also having worsening resp status with desaturations and inability to protect airway 2/2 progressive somnolence. Family was spoken with and consented to re-intubation. Pt cont to deteriorate further atfter intubation on 9/24 with hypotension req pressors. The following day he had a noticeable decline in his renal function (with baseline ckd3a, this has likely been progressive over the previous 3 days that labs were not drawn rather than sharp change). His uop remained adequate so renal u/s and urine studies ordered and pt was given slow ivf with holding of diuretic. Echo done without systolic dysfucntion some mild RH depression and lvh noted. Over the <48 hours that I was in care of pt I noted a decline in mental status and certainly responsiveness. 9/26 he was able to spontaneously open eyes and withdrawal to painful stimuli (no following of commands). And on 03/24/2023 he was unresponsive, no eye opening, intermittent grimace to deep painful stim but no withdrawal.   Renal function had cont to decline and I was concerned that uremia might have been contributing to his ms decline. Nephrology was consulted that am. I had also ordered a cth that was remarkably stable per radiology and eeg (pending) as well as LLE doppler (2/2 unilateral swelling,  pt's partner states this is many times noremal for him).   Lengthy discussions were had multiple times with pt's son via phone and entire family later in day via phone and again with significant other late in day re: pt's decline and the need to make decisions on his goals of care in the near future.   Unfortunately, towards the end of our conversation via phone  pt's oxygen sats sharply dropped to 70's. (See additional progress note for interventions). With increased vent settings his oxygen stabilized and before my departure his vent settings were able to be weaned somewhat with sats 100%. I updated the significant other at bedside prior to my departure.   Family had opted for full code and aggressive measures at the time of my discussion and was going to discuss pt's wishes for dialysis or trach over the evening.   Unfortunately at approx 1845 pt brady'd to asystole and depsite the best efforts of the responding code team ROSC was not achieved. Pt expired at approximately 1900.   Pertinent Labs and Studies  Significant Diagnostic Studies DG Abd 1 View  Result Date: 03/08/2021 CLINICAL DATA:  Enteric tube placement EXAM: ABDOMEN - 1 VIEW COMPARISON:  None. FINDINGS: Enteric tube with tip terminating in the gastric antrum versus proximal duodenum. Scattered air-filled loops of bowel are seen. No abnormal calcification or unexpected radiopaque foreign body. The osseous structures are unremarkable. IMPRESSION: Enteric tube with tip terminating either in the gastric antrum or proximal duodenum. Electronically Signed   By: Olive Bass M.D.   On: 03/08/2021 15:03   DG Abd 1 View  Result Date: 03/08/2021 CLINICAL DATA:  Orogastric tube placement EXAM: ABDOMEN - 1 VIEW COMPARISON:  2021-03-22 FINDINGS: Orogastric tube not identified within the visualized chest and upper abdomen. Visualized left hemithorax is clear. Cardiac size within normal limits. IMPRESSION: Orogastric tube not identified within the visualized chest and abdomen. Correlation for malposition within the oropharynx is recommended. These results will be called to the ordering clinician or representative by the Radiologist Assistant, and communication documented in the PACS or [employer]. Electronically Signed   By: Helyn Numbers M.D.   On: 03/08/2021 11:46   CT HEAD WO CONTRAST  ( )  Result Date: 03/11/2021 CLINICAL DATA:  Follow-up subarachnoid hemorrhage EXAM: CT HEAD WITHOUT CONTRAST TECHNIQUE: Contiguous axial images were obtained from the base of the skull through the vertex without intravenous contrast. COMPARISON:  03/10/2021 FINDINGS: Brain: Subarachnoid hemorrhage again identified throughout the sulci of the bilateral parietal and occipital lobes, unchanged since prior exam. There is a small amount of intraventricular hemorrhage layering dependently within the occipital horns of the lateral ventricles, stable. Stable punctate hemorrhage within the right thalamus. Decreased subarachnoid hemorrhage at the basilar cisterns. Resolution of the subdural hematoma along the left frontal convexity seen previously. No new areas of hemorrhage since prior exam. No signs of acute infarct. Lateral ventricles and midline structures are otherwise unremarkable. No mass effect. Vascular: No hyperdense vessel or unexpected calcification. Skull: Normal. Negative for fracture or focal lesion. Sinuses/Orbits: No acute finding. Other: None. IMPRESSION: 1. Stable subarachnoid hemorrhage along the bilateral parietal and occipital lobes. 2. Stable dependent intraventricular hemorrhage within the occipital horns of the lateral ventricles. 3. Stable punctate hemorrhage within the right thalamus. 4. Decreased subarachnoid hemorrhage of the basilar cisterns, and resolution of the left frontal convexity subdural hematoma seen previously. 5. No evidence of acute infarct. Electronically Signed   By: Sharlet Salina M.D.   On: 03/15/2021 16:55  CT HEAD WO CONTRAST ( )  Result Date: 03/10/2021 CLINICAL DATA:  Known subarachnoid hemorrhage. Increasing somnolence. Mental status change, unknown cause follow up [facility]-- more somnolent EXAM: CT HEAD WITHOUT CONTRAST TECHNIQUE: Contiguous axial images were obtained from the base of the skull through the vertex without intravenous contrast. COMPARISON:  March 05, 2021, September 03, 2020 FINDINGS: Brain: There are curvilinear hyperdense blood products overlying predominantly of the bilateral parietal lobes and occipital lobes, with newly conspicuous high density blood products in comparison to prior. Increased high density intraventricular blood products layering within bilateral posterior horns of the lateral ventricles. Overall volume of blood products within the ventricles is relatively unchanged in comparison to prior. There are decreased blood products within the basal cisterns in comparison to prior with persistent focal hyperdensity along the LEFT posterior aspect the sella. There is a mildly decreased dilation of the lateral ventricles in comparison to prior. Focal hyperdensity within the RIGHT thalamus is unchanged in comparison to prior. Mildly increased CT prominence of a hypodense subdural collection measuring approximately 3 mm overlying the LEFT convexity in comparison to prior. There is minimal LEFT to RIGHT midline shift of approximately 2 mm. Vascular: No new findings. Skull: Normal. Negative for fracture or focal lesion. Sinuses/Orbits: Enteric tube. Mild mucosal thickening maxillary sinus. Other: None IMPRESSION: 1. Revisualization of extensive subarachnoid hemorrhage with likely redistribution of high density blood products to within the ventricles and overlying the parietooccipital convexities. Decreased amount of blood products within the basal cisterns with focal blood products remaining along the posterior aspect of the sella. 2. Increased CT conspicuity of the previously described subdural collection on recent MRI overlying the LEFT frontoparietal convexity. It measures approximately 3 mm in thickness. There is minimal LEFT-to-RIGHT midline shift of approximately 2 mm. 3. Overall decrease in size of the lateral ventricles in comparison to prior CT. Electronically Signed   By: Meda Klinefelter M.D.   On: 03/10/2021 15:33   CT HEAD WO  CONTRAST  Result Date: 09-Mar-2021 CLINICAL DATA:  Headache.  Follow-up intracranial hemorrhage. EXAM: CT HEAD WITHOUT CONTRAST TECHNIQUE: Contiguous axial images were obtained from the base of the skull through the vertex without intravenous contrast. COMPARISON:  CT studies earlier same day. FINDINGS: Brain: Subarachnoid hemorrhage at the base of the brain appears similar to the previous examination. There is more blood that has made its way into the ventricular system, layering in the occipital horns. No evidence of development of parenchymal hemorrhage. No evidence of infarction or change in ventricular size. Vascular: No other vascular finding. Skull: Negative Sinuses/Orbits: Clear/normal Other: None IMPRESSION: Similar appearance of subarachnoid hemorrhage primarily at the base of the brain. Increased amount of blood layering dependently in the occipital horns of the lateral ventricles. No intraparenchymal hemorrhage. No change in ventricular size. Electronically Signed   By: Paulina Fusi M.D.   On: 2021-03-09 15:42   MR ANGIO HEAD WO CONTRAST  Result Date: 03/06/2021 CLINICAL DATA:  Follow-up examination for intracranial hemorrhage. EXAM: MRI HEAD WITHOUT CONTRAST MRA HEAD WITHOUT CONTRAST TECHNIQUE: Multiplanar, multi-echo pulse sequences of the brain and surrounding structures were acquired without intravenous contrast. Angiographic images of the Circle of Willis were acquired using MRA technique without intravenous contrast. COMPARISON:  Prior CT from 03-09-21 as well as previous studies. FINDINGS: MRI HEAD FINDINGS Brain: Again seen is large volume subarachnoid hemorrhage scattered throughout the brain, but most pronounced at the basilar cisterns. Overall appearance and volume is relatively similar to previous CT. Intraventricular extension with blood layering within the  occipital horns of both lateral ventricles, third ventricle, and fourth ventricle, consistent with redistribution. Ventricular  size is stable from prior without worsened hydrocephalus. No transependymal flow of CSF. Extra-axial extension with small subdural collections overlying the bilateral cerebral convexities, measuring up to 3-4 mm bilaterally, right slightly larger than left. No significant mass effect or midline shift. There is a suspected 7 mm acute intraparenchymal hemorrhage centered at the ventral right thalamus (series 18, image 34), also seen on prior CT in retrospect. No significant surrounding edema or other underlying abnormality. Location is most characteristic of a hypertensive bleed. No other convincing intraparenchymal hemorrhage is seen, although a few additional scattered foci of susceptibility artifact noted about the left thalamus, midbrain, and pons. No evidence for acute or subacute infarct. Gray-white matter differentiation maintained. No encephalomalacia to suggest chronic cortical infarction. No mass lesion. Pituitary gland suprasellar region grossly normal. Midline structures intact and normal. Vascular: Major intracranial vascular flow voids are maintained. Skull and upper cervical spine: Craniocervical junction within normal limits. Bone marrow signal intensity normal. No scalp soft tissue abnormality. Sinuses/Orbits: Prior ocular lens replacement noted on the left. Globes and orbital soft tissues demonstrate no acute finding. Scattered mucosal thickening noted throughout the paranasal sinuses. Patient is intubated. Mastoid air cells remain largely clear. Other: None. MRA HEAD FINDINGS Anterior circulation: Visualized distal cervical segments of the internal carotid arteries are patent with antegrade flow. Petrous, cavernous, and supraclinoid segments patent without stenosis or other abnormality. A1 segments patent bilaterally. Normal anterior communicating artery complex. Anterior cerebral arteries widely patent to their distal aspects without stenosis. No M1 stenosis or occlusion. Normal MCA bifurcations.  Distal MCA branches well perfused and symmetric. Posterior circulation: Both V4 segments patent to the vertebrobasilar junction without stenosis. Left vertebral artery dominant. Both PICA origins patent and normal. Basilar patent to its distal aspect without stenosis. Superior cerebellar arteries patent bilaterally. Both PCAs primarily supplied via the basilar well perfused to their distal aspects. Anatomic variants: None significant. No intracranial aneurysm, AVM, or other vascular abnormality. IMPRESSION: MRI HEAD IMPRESSION: 1. Large volume subarachnoid hemorrhage scattered throughout the brain, most pronounced at the basilar cisterns. Intraventricular extension consistent with redistribution. Ventricular size is stable from previous without worsened hydrocephalus. 2. Extra-axial extension with small volume subdural collections overlying the bilateral cerebral convexities, right slightly larger than left. No significant mass effect or midline shift. 3. 7 mm acute intraparenchymal hemorrhage positioned at the ventral right thalamus, with a few additional suspected punctate intraparenchymal hemorrhages about the left thalamus, midbrain, and pons. No associated edema or visible underlying structural abnormality. Location is most characteristic of hypertensive bleeds. 4. No other acute intracranial abnormality. MRA HEAD IMPRESSION: Normal intracranial MRA. No aneurysm, AVM, or other vascular abnormality. Electronically Signed   By: Rise Mu M.D.   On: 03/06/2021 05:34   MR BRAIN WO CONTRAST  Result Date: 03/06/2021 CLINICAL DATA:  Follow-up examination for intracranial hemorrhage. EXAM: MRI HEAD WITHOUT CONTRAST MRA HEAD WITHOUT CONTRAST TECHNIQUE: Multiplanar, multi-echo pulse sequences of the brain and surrounding structures were acquired without intravenous contrast. Angiographic images of the Circle of Willis were acquired using MRA technique without intravenous contrast. COMPARISON:  Prior CT  from 03/04/2021 as well as previous studies. FINDINGS: MRI HEAD FINDINGS Brain: Again seen is large volume subarachnoid hemorrhage scattered throughout the brain, but most pronounced at the basilar cisterns. Overall appearance and volume is relatively similar to previous CT. Intraventricular extension with blood layering within the occipital horns of both lateral ventricles, third ventricle, and fourth  ventricle, consistent with redistribution. Ventricular size is stable from prior without worsened hydrocephalus. No transependymal flow of CSF. Extra-axial extension with small subdural collections overlying the bilateral cerebral convexities, measuring up to 3-4 mm bilaterally, right slightly larger than left. No significant mass effect or midline shift. There is a suspected 7 mm acute intraparenchymal hemorrhage centered at the ventral right thalamus (series 18, image 34), also seen on prior CT in retrospect. No significant surrounding edema or other underlying abnormality. Location is most characteristic of a hypertensive bleed. No other convincing intraparenchymal hemorrhage is seen, although a few additional scattered foci of susceptibility artifact noted about the left thalamus, midbrain, and pons. No evidence for acute or subacute infarct. Gray-white matter differentiation maintained. No encephalomalacia to suggest chronic cortical infarction. No mass lesion. Pituitary gland suprasellar region grossly normal. Midline structures intact and normal. Vascular: Major intracranial vascular flow voids are maintained. Skull and upper cervical spine: Craniocervical junction within normal limits. Bone marrow signal intensity normal. No scalp soft tissue abnormality. Sinuses/Orbits: Prior ocular lens replacement noted on the left. Globes and orbital soft tissues demonstrate no acute finding. Scattered mucosal thickening noted throughout the paranasal sinuses. Patient is intubated. Mastoid air cells remain largely clear.  Other: None. MRA HEAD FINDINGS Anterior circulation: Visualized distal cervical segments of the internal carotid arteries are patent with antegrade flow. Petrous, cavernous, and supraclinoid segments patent without stenosis or other abnormality. A1 segments patent bilaterally. Normal anterior communicating artery complex. Anterior cerebral arteries widely patent to their distal aspects without stenosis. No M1 stenosis or occlusion. Normal MCA bifurcations. Distal MCA branches well perfused and symmetric. Posterior circulation: Both V4 segments patent to the vertebrobasilar junction without stenosis. Left vertebral artery dominant. Both PICA origins patent and normal. Basilar patent to its distal aspect without stenosis. Superior cerebellar arteries patent bilaterally. Both PCAs primarily supplied via the basilar well perfused to their distal aspects. Anatomic variants: None significant. No intracranial aneurysm, AVM, or other vascular abnormality. IMPRESSION: MRI HEAD IMPRESSION: 1. Large volume subarachnoid hemorrhage scattered throughout the brain, most pronounced at the basilar cisterns. Intraventricular extension consistent with redistribution. Ventricular size is stable from previous without worsened hydrocephalus. 2. Extra-axial extension with small volume subdural collections overlying the bilateral cerebral convexities, right slightly larger than left. No significant mass effect or midline shift. 3. 7 mm acute intraparenchymal hemorrhage positioned at the ventral right thalamus, with a few additional suspected punctate intraparenchymal hemorrhages about the left thalamus, midbrain, and pons. No associated edema or visible underlying structural abnormality. Location is most characteristic of hypertensive bleeds. 4. No other acute intracranial abnormality. MRA HEAD IMPRESSION: Normal intracranial MRA. No aneurysm, AVM, or other vascular abnormality. Electronically Signed   By: Rise Mu M.D.   On:  03/06/2021 05:34   US RENAL  Result Date: 03/11/2021 CLINICAL DATA:  Acute kidney injury. EXAM: RENAL / URINARY TRACT ULTRASOUND COMPLETE COMPARISON:  None. FINDINGS: Right Kidney: Renal measurements: 12.4 x 7.0 x 6.2 cm = volume: 280 mL. Echogenicity within normal limits. No mass or hydronephrosis visualized. Left Kidney: Renal measurements: 12.6 x 7.3 x 6.2 cm = volume: 299 mL. 1.8 cm simple cyst is noted in upper pole. Echogenicity within normal limits. No mass or hydronephrosis visualized. Bladder: Decompressed secondary to Foley catheter. Other: None. IMPRESSION: No significant renal abnormality is noted. Electronically Signed   By: Lupita Raider M.D.   On: 03/11/2021 20:21   DG CHEST PORT 1 VIEW  Result Date: 2021-03-18 CLINICAL DATA:  Acute hypoxic respiratory failure. EXAM: PORTABLE  CHEST 1 VIEW COMPARISON:  03/10/2021 FINDINGS: Endotracheal tube is 2.4 cm above the carina. There is a feeding tube but difficult to evaluate the tube course. Entire costophrenic angles are not imaged. No significant airspace disease or pulmonary edema. Probable calcified granuloma in the left upper lung. Negative for a pneumothorax. Calcifications in the left hilum and left mediastinal region. IMPRESSION: 1.  Endotracheal tube is appropriately positioned. 2. Stable appearance of the lungs without significant airspace disease or consolidation. Electronically Signed   By: Richarda Overlie M.D.   On: 2021/04/07 17:14   Portable Chest x-ray  Result Date: 03/10/2021 CLINICAL DATA:  Intubation. EXAM: PORTABLE CHEST 1 VIEW COMPARISON:  March 05, 2021. FINDINGS: The heart size and mediastinal contours are within normal limits. Endotracheal tube tip is approximately 1 cm above the carina. Mild bibasilar subsegmental atelectasis is noted. The visualized skeletal structures are unremarkable. IMPRESSION: Endotracheal tube tip is approximately 1 cm above the carina; withdrawal by 2-3 cm is recommended. Mild bibasilar  subsegmental atelectasis is noted. Electronically Signed   By: Lupita Raider M.D.   On: 03/10/2021 18:14   DG Chest Portable 1 View  Result Date: 03/07/2021 CLINICAL DATA:  Intubated EXAM: PORTABLE CHEST 1 VIEW COMPARISON:  None FINDINGS: Endotracheal tube is 3 cm above the carina. Possible enteric tube coiled in the pharynx. Low lung volumes with crowding of markings. No consolidation or edema. No pleural effusion. Normal heart size. IMPRESSION: Endotracheal tube in place. Possible enteric tube coiled in the pharynx. No acute abnormality. Electronically Signed   By: Guadlupe Spanish M.D.   On: 02/15/2021 08:51   DG Abd Portable 1V  Result Date: 03/10/2021 CLINICAL DATA:  Abdominal distension EXAM: PORTABLE ABDOMEN - 1 VIEW COMPARISON:  03/09/2021 FINDINGS: Gas distended loops of colon are again seen throughout the abdomen, measuring up to 8.9 cm, somewhat increased prior examination. There is scattered gas present in the distal colon the rectum. No obvious free air on supine radiographs. Enteric feeding tube is positioned with tip and side port below the diaphragm. IMPRESSION: Gas distended loops of colon are again seen throughout the abdomen, measuring up to 8.9 cm, somewhat increased compared to prior examination. There is scattered gas present in the distal colon to the rectum. Findings favor ileus. Electronically Signed   By: Lauralyn Primes M.D.   On: 03/10/2021 16:40   DG Abd Portable 1V  Result Date: 03/09/2021 CLINICAL DATA:  Enteric tube placement EXAM: PORTABLE ABDOMEN - 1 VIEW COMPARISON:  March 08, 2021 FINDINGS: Incomplete assessment of the pelvis. Enteric tube tip terminates over the region of the distal stomach. Gaseous distension of loops of large bowel. Scattered bibasilar opacities, likely atelectasis IMPRESSION: Enteric tube tip terminates over the distal stomach. Electronically Signed   By: Meda Klinefelter M.D.   On: 03/09/2021 14:15   DG Abd Portable 1V  Result Date:  03/10/2021 CLINICAL DATA:  Orogastric tube placement. EXAM: PORTABLE ABDOMEN - 1 VIEW COMPARISON:  None. FINDINGS: The bowel gas pattern is normal. Distal tip of nasogastric tube is seen in expected position of distal stomach. No radio-opaque calculi or other significant radiographic abnormality are seen. IMPRESSION: Distal tip of nasogastric tube is seen in expected position of distal stomach. Electronically Signed   By: Lupita Raider M.D.   On: 03/01/2021 14:30   ECHOCARDIOGRAM COMPLETE  Result Date: 03/06/2021    ECHOCARDIOGRAM REPORT   Patient Name:   ALVIN DIFFEE Date of Exam: 03/06/2021 Medical Rec #:  [id number]  Height:       70.0 in Accession #:    [id number]     Weight:       287.5 lb Date of Birth:  Jul 12, 1945      BSA:          2.435 m Patient Age:    75 years       BP:           121/53 mmHg Patient Gender: M              HR:           63 bpm. Exam Location:  Inpatient Procedure: 2D Echo, Cardiac Doppler, Color Doppler and Intracardiac            Opacification Agent Indications:    Stroke  History:        Patient has no prior history of Echocardiogram examinations.                 Risk Factors:Hypertension, Diabetes and Former Smoker.  Sonographer:    Ross Ludwig RDCS (AE) Referring Phys: [id number] Be[address] Arbour [facility]  Sonographer Comments: No subcostal window, Technically difficult study due to poor echo windows, patient is morbidly obese and echo performed with patient supine and on artificial respirator. Image acquisition challenging due to patient body habitus. IMPRESSIONS  1. There is a dynamic mid cavitary LV gradient of due to hyperdynamic LVF.Marland Kitchen Left ventricular ejection fraction, by estimation, is 70 to 75%. The left ventricle has hyperdynamic function. The left ventricle has no regional wall motion abnormalities. There is severe asymmetric left ventricular hypertrophy of the septal segment. Left ventricular diastolic parameters are consistent with Grade I diastolic dysfunction  (impaired relaxation).  2. Right ventricular systolic function is mildly reduced. The right ventricular size is not well visualized.  3. The mitral valve is normal in structure. No evidence of mitral valve regurgitation. No evidence of mitral stenosis.  4. The aortic valve is normal in structure. Aortic valve regurgitation is not visualized. No aortic stenosis is present.  5. The inferior vena cava is normal in size with greater than 50% respiratory variability, suggesting right atrial pressure of 3 mmHg. FINDINGS  Left Ventricle: There is a dynamic mid cavitary LV gradient of due to hyperdynamic LVF. Left ventricular ejection fraction, by estimation, is 70 to 75%. The left ventricle has hyperdynamic function. The left ventricle has no regional wall motion abnormalities. Definity contrast agent was given IV to delineate the left ventricular endocardial borders. The left ventricular internal cavity size was normal in size. There is severe asymmetric left ventricular hypertrophy of the septal segment. Left ventricular diastolic parameters are consistent with Grade I diastolic dysfunction (impaired relaxation). Normal left ventricular filling pressure. Right Ventricle: The right ventricular size is not well visualized. Right vetricular wall thickness was not assessed. Right ventricular systolic function is mildly reduced. Left Atrium: Left atrial size was normal in size. Right Atrium: Right atrial size was normal in size. Pericardium: There is no evidence of pericardial effusion. Mitral Valve: The mitral valve is normal in structure. No evidence of mitral valve regurgitation. No evidence of mitral valve stenosis. Tricuspid Valve: The tricuspid valve is normal in structure. Tricuspid valve regurgitation is trivial. No evidence of tricuspid stenosis. Aortic Valve: The aortic valve is normal in structure. Aortic valve regurgitation is not visualized. No aortic stenosis is present. Aortic valve mean gradient  measures 9.0 mmHg. Aortic valve peak gradient measures 15.4 mmHg. Aortic valve area, by VTI measures 4.40  cm. Pulmonic Valve: The pulmonic valve was normal in structure. Pulmonic valve regurgitation is not visualized. No evidence of pulmonic stenosis. Aorta: The aortic root is normal in size and structure. Venous: The inferior vena cava is normal in size with greater than 50% respiratory variability, suggesting right atrial pressure of 3 mmHg. IAS/Shunts: No atrial level shunt detected by color flow Doppler.  LEFT VENTRICLE PLAX 2D LVIDd:         4.00 cm  Diastology LVIDs:         2.40 cm  LV e' medial:    6.64 cm/s LV IVS:        1.90 cm  LV E/e' medial:  10.8 LVOT diam:     2.20 cm  LV e' lateral:   10.60 cm/s LV SV:         140      LV E/e' lateral: 6.8 LV SV Index:   58 LVOT Area:     3.80 cm  RIGHT VENTRICLE RV Basal diam:  2.50 cm RV S prime:     20.30 cm/s LEFT ATRIUM             Index       RIGHT ATRIUM           Index LA diam:        3.90 cm 1.60 cm/m  RA Area:     12.80 cm LA Vol (A2C):   44.5 ml 18.27 ml/m RA Volume:   25.60 ml  10.51 ml/m LA Vol (A4C):   43.3 ml 17.78 ml/m LA Biplane Vol: 45.1 ml 18.52 ml/m  AORTIC VALVE AV Area (Vmax):    3.36 cm AV Area (Vmean):   3.18 cm AV Area (VTI):     4.40 cm AV Vmax:           196.00 cm/s AV Vmean:          147.000 cm/s AV VTI:            0.319 m AV Peak Grad:      15.4 mmHg AV Mean Grad:      9.0 mmHg LVOT Vmax:         173.00 cm/s LVOT Vmean:        123.000 cm/s LVOT VTI:          0.369 m LVOT/AV VTI ratio: 1.16  AORTA Ao Root diam: 3.40 cm Ao Asc diam:  3.30 cm MITRAL VALVE MV Area (PHT): 2.46 cm    SHUNTS MV Decel Time: 308 msec    Systemic VTI:  0.37 m MV E velocity: 72.00 cm/s  Systemic Diam: 2.20 cm MV A velocity: 72.80 cm/s MV E/A ratio:  0.99 Armanda Magic MD Electronically signed by Armanda Magic MD Signature Date/Time: 03/06/2021/10:44:08 AM    Final    CT HEAD CODE STROKE WO CONTRAST  Result Date: 02/26/2021 CLINICAL DATA:  Neuro  deficit, acute, stroke suspected EXAM: CT ANGIOGRAPHY HEAD AND NECK TECHNIQUE: Multidetector CT imaging of the head and neck was performed using the standard protocol during bolus administration of intravenous contrast. Multiplanar CT image reconstructions and MIPs were obtained to evaluate the vascular anatomy. Carotid stenosis measurements (when applicable) are obtained utilizing NASCET criteria, using the distal internal carotid diameter as the denominator. CONTRAST:  75 mL Omnipaque 350 COMPARISON:  None. FINDINGS: CT HEAD FINDINGS Brain: Large volume subarachnoid hemorrhage is present within the basal cisterns. This extends inferiorly along the brainstem and laterally into the left greater than right sylvian fissures.  Scattered parasagittal sulcal subarachnoid hemorrhage. Mild prominence of the ventricles may reflect volume loss or mild hydrocephalus. Gray-white differentiation is preserved. No extra-axial collection. Vascular: Better evaluated on CTA portion. Skull: Unremarkable. Sinuses: Minor mucosal thickening. Orbits: Left lens replacement. Review of the MIP images confirms the above findings CTA NECK FINDINGS Aortic arch: Minor plaque along the arch. Great vessel origins are patent. Right carotid system: Patent. Mild calcified plaque along the distal common carotid and proximal internal carotid. No stenosis. Left carotid system: Patent.  No stenosis. Vertebral arteries: Patent.  No stenosis. Skeleton: No acute osseous abnormality. Other neck: Enteric tube is coiled in the pharynx and proximal esophagus. Upper chest: Included upper lungs are clear. Review of the MIP images confirms the above findings CTA HEAD FINDINGS Anterior circulation: Intracranial internal carotid arteries are patent with mild calcified plaque along the cavernous segments. Additional mild calcified plaque along the paraclinoid portions. No high-grade stenosis. Anterior and middle cerebral arteries are patent. No aneurysm identified.  Posterior circulation: Intracranial vertebral arteries are patent. Basilar artery is patent. Major cerebellar artery origins are patent. Posterior cerebral arteries are patent. Venous sinuses: As permitted by contrast timing, patent. Review of the MIP images confirms the above findings IMPRESSION: Large volume subarachnoid hemorrhage centered within the basal cisterns. Mild prominence of the ventricles may reflect volume loss or mild hydrocephalus. No evidence of acute infarction. No aneurysm or AVM. These results were communicated to Dr. Thomasena Edis at 8:56 am on 03/06/2021 by text page via the [facility] messaging system. Electronically Signed   By: Guadlupe Spanish M.D.   On: 03/02/2021 09:06   VAS [place] TRANSCRANIAL DOPPLER  Result Date: 03/12/2021  Transcranial Doppler Patient Name:  AHMERE HEMENWAY  Date of Exam:   03/12/2021 Medical Rec #: [id number]       Accession #:    [id number] Date of Birth: [birth year]       Patient Gender: M Patient Age:   60 years Exam Location:  [facility] Procedure:      VAS [place] TRANSCRANIAL DOPPLER Referring Phys: Scheryl Marten XU --------------------------------------------------------------------------------  Indications: Subarachnoid hemorrhage. Limitations: Difficult study due to patient breathing (on vent), and inability              to cooperate with exam (became agitated while examining left side). Performing Technologist: Ernestene Mention RVT, RDMS  Examination Guidelines: A complete evaluation includes B-mode imaging, spectral Doppler, color Doppler, and power Doppler as needed of all accessible portions of each vessel. Bilateral testing is considered an integral part of a complete examination. Limited examinations for reoccurring indications may be performed as noted.  +----------+-------------+----------+-----------+------------------+ RIGHT TCD Right VM (cm)Depth (cm)Pulsatility     Comment       +----------+-------------+----------+-----------+------------------+ MCA            77.00       5.60      1.97                       +----------+-------------+----------+-----------+------------------+ ACA          -37.00       6.80      1.73                       +----------+-------------+----------+-----------+------------------+ Term ICA      33.00       6.60      1.81                       +----------+-------------+----------+-----------+------------------+  PCA           35.00       6.00      1.94    reversal of flow?  +----------+-------------+----------+-----------+------------------+ Opthalmic     20.00       3.60      1.71                       +----------+-------------+----------+-----------+------------------+ ICA siphon                                  unable to insonate +----------+-------------+----------+-----------+------------------+ Vertebral    -26.00       6.70      3.00                       +----------+-------------+----------+-----------+------------------+ Distal ICA    41.00       4.90      1.64                       +----------+-------------+----------+-----------+------------------+  +----------+------------+----------+-----------+------------------+ LEFT TCD  Left VM (cm)Depth (cm)Pulsatility     Comment       +----------+------------+----------+-----------+------------------+ MCA          69.00       5.90      1.83                       +----------+------------+----------+-----------+------------------+ ACA                                        unable to insonate +----------+------------+----------+-----------+------------------+ Term ICA     54.00       6.60      1.74                       +----------+------------+----------+-----------+------------------+ PCA          33.00       6.80      1.50                       +----------+------------+----------+-----------+------------------+ Opthalmic    17.00       3.40      1.61                        +----------+------------+----------+-----------+------------------+ ICA siphon                                 unable to insonate +----------+------------+----------+-----------+------------------+ Vertebral    -24.00      6.40      1.71                       +----------+------------+----------+-----------+------------------+ Distal ICA   59.00       5.10      1.53                       +----------+------------+----------+-----------+------------------+  +------------+------+------------------+             VM cm      Comment       +------------+------+------------------+ Prox Basilar-45.00                   +------------+------+------------------+  Dist Basilar      unable to insonate +------------+------+------------------+ +----------------------+----+ Right Lindegaard Ratio1.88 +----------------------+----+ +---------------------+----+ Left Lindegaard Ratio1.17 +---------------------+----+    Preliminary    VAS [place] TRANSCRANIAL DOPPLER  Result Date: 03/10/2021  Transcranial Doppler Patient Name:  TORION HULGAN  Date of Exam:   03/09/2021 Medical Rec #: [id number]       Accession #:    [id number] Date of Birth: [birth year]       Patient Gender: M Patient Age:   89 years Exam Location:  [facility] Procedure:      VAS [place] TRANSCRANIAL DOPPLER Referring Phys: Scheryl Marten XU --------------------------------------------------------------------------------  Indications: Subarachnoid hemorrhage. Limitations: Difficult exam due to patient's altered mental status (unable to              cooperate) & heavy breathing Comparison Study: Previous exam 03/07/2021 Performing Technologist: Ernestene Mention RVT, RDMS  Examination Guidelines: A complete evaluation includes B-mode imaging, spectral Doppler, color Doppler, and power Doppler as needed of all accessible portions of each vessel. Bilateral testing is considered an integral part of a complete examination. Limited examinations for reoccurring  indications may be performed as noted.  +----------+-------------+----------+-----------+-------------+ RIGHT TCD Right VM (cm)Depth (cm)Pulsatility   Comment    +----------+-------------+----------+-----------+-------------+ MCA           30.00       4.60      1.78                  +----------+-------------+----------+-----------+-------------+ ACA                                         not insonated +----------+-------------+----------+-----------+-------------+ Term ICA                                    not insonated +----------+-------------+----------+-----------+-------------+ PCA           20.00       5.10      1.56                  +----------+-------------+----------+-----------+-------------+ Opthalmic     14.00       3.80      1.54                  +----------+-------------+----------+-----------+-------------+ ICA siphon                                  not insonated +----------+-------------+----------+-----------+-------------+ Vertebral                                   not insonated +----------+-------------+----------+-----------+-------------+ Distal ICA    47.00       5.10      1.85                  +----------+-------------+----------+-----------+-------------+  +----------+------------+----------+-----------+-------------+ LEFT TCD  Left VM (cm)Depth (cm)Pulsatility   Comment    +----------+------------+----------+-----------+-------------+ MCA          63.00       5.80      1.57                  +----------+------------+----------+-----------+-------------+ ACA  not insonated +----------+------------+----------+-----------+-------------+ Term ICA     33.00       6.60      1.46                  +----------+------------+----------+-----------+-------------+ PCA          25.00       6.70      1.78                  +----------+------------+----------+-----------+-------------+  Opthalmic    12.00       3.60      1.14                  +----------+------------+----------+-----------+-------------+ ICA siphon                                 not insonated +----------+------------+----------+-----------+-------------+ Vertebral                                  not insonated +----------+------------+----------+-----------+-------------+ Distal ICA   23.00       5.30      1.44                  +----------+------------+----------+-----------+-------------+  +------------+-----+-------------+             VM cm   Comment    +------------+-----+-------------+ Prox Basilar     not insonated +------------+-----+-------------+ Dist Basilar     not insonated +------------+-----+-------------+ +----------------------+----+ Right Lindegaard Ratio0.64 +----------------------+----+ +---------------------+----+ Left Lindegaard Ratio2.74 +---------------------+----+  Summary:  Absent suboccipital window limits evaluation of posterior circulation vessels. Low normal mean flow velocities in all identified vesssels of anterior circulation bilaterally. No evidence of vasospasm. *See table(s) above for TCD measurements and observations.  Diagnosing physician: Delia Heady MD Electronically signed by Delia Heady MD on 03/10/2021 at 12:38:33 PM.    Final    VAS [place] TRANSCRANIAL DOPPLER  Result Date: 03/08/2021  Transcranial Doppler Patient Name:  ATWOOD ADCOCK  Date of Exam:   03/07/2021 Medical Rec #: [id number]       Accession #:    [id number] Date of Birth: 29-Jan-1946       Patient Gender: M Patient Age:   75 years Exam Location:  [facility] Procedure:      VAS [place] TRANSCRANIAL DOPPLER Referring Phys: Marvel Plan --------------------------------------------------------------------------------  Indications: Stroke. Limitations: patient movement and positioning Limitations for diagnostic windows: Unable to insonate right transtemporal window. Unable to insonate left  transtemporal window. Comparison Study: no prior Performing Technologist: Argentina Ponder RVS  Examination Guidelines: A complete evaluation includes B-mode imaging, spectral Doppler, color Doppler, and power Doppler as needed of all accessible portions of each vessel. Bilateral testing is considered an integral part of a complete examination. Limited examinations for reoccurring indications may be performed as noted.  +----------+-------------+----------+-----------+------------------+ RIGHT TCD Right VM (cm)Depth (cm)Pulsatility     Comment       +----------+-------------+----------+-----------+------------------+ MCA                                         unable to insonate +----------+-------------+----------+-----------+------------------+ ACA  unable to insonate +----------+-------------+----------+-----------+------------------+ Term ICA                                    unable to insonate +----------+-------------+----------+-----------+------------------+ PCA                                         unable to insonate +----------+-------------+----------+-----------+------------------+ Opthalmic     18.00                 1.29                       +----------+-------------+----------+-----------+------------------+ ICA siphon                                  unable to insonate +----------+-------------+----------+-----------+------------------+ Vertebral                                   unable to insonate +----------+-------------+----------+-----------+------------------+ Distal ICA                                  unable to insonate +----------+-------------+----------+-----------+------------------+  +----------+------------+----------+-----------+------------------+ LEFT TCD  Left VM (cm)Depth (cm)Pulsatility     Comment       +----------+------------+----------+-----------+------------------+ MCA                                         unable to insonate +----------+------------+----------+-----------+------------------+ ACA                                        unable to insonate +----------+------------+----------+-----------+------------------+ Term ICA                                   unable to insonate +----------+------------+----------+-----------+------------------+ PCA                                        unable to insonate +----------+------------+----------+-----------+------------------+ Opthalmic    18.00                 1.59                       +----------+------------+----------+-----------+------------------+ ICA siphon                                 unable to insonate +----------+------------+----------+-----------+------------------+ Vertebral                                  unable to insonate +----------+------------+----------+-----------+------------------+ Distal ICA  unable to insonate +----------+------------+----------+-----------+------------------+  +------------+-----+------------------+             VM cm     Comment       +------------+-----+------------------+ Prox Basilar     unable to insonate +------------+-----+------------------+ Summary:  Highly limited suboptimal study duw to poor windows throughout. Antegrade flow noted in both opthalmic arteries only. *See table(s) above for TCD measurements and observations.  Diagnosing physician: Delia Heady MD Electronically signed by Delia Heady MD on 03/08/2021 at 9:35:39 AM.    Final    CT ANGIO HEAD NECK W WO CM (CODE STROKE)  Result Date: 03/16/2021 CLINICAL DATA:  Neuro deficit, acute, stroke suspected EXAM: CT ANGIOGRAPHY HEAD AND NECK TECHNIQUE: Multidetector CT imaging of the head and neck was performed using the standard protocol during bolus administration of intravenous contrast. Multiplanar CT image reconstructions and MIPs were  obtained to evaluate the vascular anatomy. Carotid stenosis measurements (when applicable) are obtained utilizing NASCET criteria, using the distal internal carotid diameter as the denominator. CONTRAST:  75 mL Omnipaque 350 COMPARISON:  None. FINDINGS: CT HEAD FINDINGS Brain: Large volume subarachnoid hemorrhage is present within the basal cisterns. This extends inferiorly along the brainstem and laterally into the left greater than right sylvian fissures. Scattered parasagittal sulcal subarachnoid hemorrhage. Mild prominence of the ventricles may reflect volume loss or mild hydrocephalus. Gray-white differentiation is preserved. No extra-axial collection. Vascular: Better evaluated on CTA portion. Skull: Unremarkable. Sinuses: Minor mucosal thickening. Orbits: Left lens replacement. Review of the MIP images confirms the above findings CTA NECK FINDINGS Aortic arch: Minor plaque along the arch. Great vessel origins are patent. Right carotid system: Patent. Mild calcified plaque along the distal common carotid and proximal internal carotid. No stenosis. Left carotid system: Patent.  No stenosis. Vertebral arteries: Patent.  No stenosis. Skeleton: No acute osseous abnormality. Other neck: Enteric tube is coiled in the pharynx and proximal esophagus. Upper chest: Included upper lungs are clear. Review of the MIP images confirms the above findings CTA HEAD FINDINGS Anterior circulation: Intracranial internal carotid arteries are patent with mild calcified plaque along the cavernous segments. Additional mild calcified plaque along the paraclinoid portions. No high-grade stenosis. Anterior and middle cerebral arteries are patent. No aneurysm identified. Posterior circulation: Intracranial vertebral arteries are patent. Basilar artery is patent. Major cerebellar artery origins are patent. Posterior cerebral arteries are patent. Venous sinuses: As permitted by contrast timing, patent. Review of the MIP images confirms the  above findings IMPRESSION: Large volume subarachnoid hemorrhage centered within the basal cisterns. Mild prominence of the ventricles may reflect volume loss or mild hydrocephalus. No evidence of acute infarction. No aneurysm or AVM. These results were communicated to Dr. Thomasena Edis at 8:56 am on 02/20/2021 by text page via the [facility] messaging system. Electronically Signed   By: Guadlupe Spanish M.D.   On: 03/03/2021 09:06    Microbiology Recent Results (from the past 240 hour(s))  Resp Panel by RT-PCR (Flu A&B, Covid) Nasopharyngeal Swab     Status: None   Collection Time: 02/25/2021  9:44 AM   Specimen: Nasopharyngeal Swab; Nasopharyngeal(NP) swabs in vial transport medium  Result Value Ref Range Status   SARS Coronavirus 2 by RT PCR NEGATIVE NEGATIVE Final    Comment: (NOTE) SARS-CoV-2 target nucleic acids are NOT DETECTED.  The SARS-CoV-2 RNA is generally detectable in upper respiratory specimens during the acute phase of infection. The lowest concentration of SARS-CoV-2 viral copies this assay can detect is 138 copies/mL. A negative result does not preclude SARS-Cov-2  infection and should not be used as the sole basis for treatment or other patient management decisions. A negative result may occur with  improper specimen collection/handling, submission of specimen other than nasopharyngeal swab, presence of viral mutation(s) within the areas targeted by this assay, and inadequate number of viral copies(<138 copies/mL). A negative result must be combined with clinical observations, patient history, and epidemiological information. The expected result is Negative.  Fact Sheet for Patients:  BloggerCourse.com  Fact Sheet for Healthcare Providers:  SeriousBroker.it  This test is no t yet approved or cleared by the [place] FDA and  has been authorized for detection and/or diagnosis of SARS-CoV-2 by FDA under an Emergency Use  Authorization (EUA). This EUA will remain  in effect (meaning this test can be used) for the duration of the COVID-19 declaration under Section 564(b)(1) of the Act, 21 U.S.C.section 360bbb-3(b)(1), unless the authorization is terminated  or revoked sooner.       Influenza A by PCR NEGATIVE NEGATIVE Final   Influenza B by PCR NEGATIVE NEGATIVE Final    Comment: (NOTE) The Xpert Xpress SARS-CoV-2/FLU/RSV plus assay is intended as an aid in the diagnosis of influenza from Nasopharyngeal swab specimens and should not be used as a sole basis for treatment. Nasal washings and aspirates are unacceptable for Xpert Xpress SARS-CoV-2/FLU/RSV testing.  Fact Sheet for Patients: BloggerCourse.com  Fact Sheet for Healthcare Providers: SeriousBroker.it  This test is not yet approved or cleared by the [place] FDA and has been authorized for detection and/or diagnosis of SARS-CoV-2 by FDA under an Emergency Use Authorization (EUA). This EUA will remain in effect (meaning this test can be used) for the duration of the COVID-19 declaration under Section 564(b)(1) of the Act, 21 U.S.C. section 360bbb-3(b)(1), unless the authorization is terminated or revoked.  Performed at [facility], [address] [place], [place] [address]   MRSA Next Gen by PCR, Nasal     Status: None   Collection Time: 03/08/2021 11:12 AM   Specimen: Nasal Mucosa; Nasal Swab  Result Value Ref Range Status   MRSA by PCR Next Gen NOT DETECTED NOT DETECTED Final    Comment: (NOTE) The GeneXpert MRSA Assay (FDA approved for NASAL specimens only), is one component of a comprehensive MRSA colonization surveillance program. It is not intended to diagnose MRSA infection nor to guide or monitor treatment for MRSA infections. Test performance is not FDA approved in patients less than 55 years old. Performed at [facility], [address], [place],  [place] [address]   Culture, Respiratory w Gram Stain     Status: None   Collection Time: 03/07/21  8:58 AM   Specimen: Tracheal Aspirate; Respiratory  Result Value Ref Range Status   Specimen Description TRACHEAL ASPIRATE  Final   Special Requests NONE  Final   Gram Stain   Final    FEW WBC PRESENT,BOTH PMN AND MONONUCLEAR MODERATE GRAM POSITIVE RODS FEW GRAM NEGATIVE RODS RARE BUDDING YEAST SEEN Performed at [facility], [address], [place], [place] [address]    Culture MODERATE STREPTOCOCCUS PNEUMONIAE  Final   Report Status 03/10/2021 FINAL  Final   Organism ID, Bacteria STREPTOCOCCUS PNEUMONIAE  Final      Susceptibility   Streptococcus pneumoniae - MIC*    ERYTHROMYCIN 2 RESISTANT Resistant     LEVOFLOXACIN 0.5 SENSITIVE Sensitive     VANCOMYCIN 0.5 SENSITIVE Sensitive     PENICILLIN (meningitis) 0.5 RESISTANT Resistant  PENO - penicillin 0.5      PENICILLIN (non-meningitis) 0.5 SENSITIVE Sensitive     PENICILLIN (oral) 0.5 INTERMEDIATE Intermediate     CEFTRIAXONE (non-meningitis) 0.25 SENSITIVE Sensitive     CEFTRIAXONE (meningitis) 0.25 SENSITIVE Sensitive     * MODERATE STREPTOCOCCUS PNEUMONIAE    Lab Basic Metabolic Panel: Recent Labs  Lab 03/07/21 1514 03/07/21 1956 03/08/21 0151 03/08/21 1139 03/11/21 0906 03/11/21 1610 03/11/21 2219 03/12/21 0538 02/20/2021 0548  NA 154*   < > 151*  151*   < > 154* 153* 151* 151* 150*  K  --   --  4.1   < > 3.5 3.4* 3.8 3.7 3.8  CL  --    < > 122*  --  128* 126* 124* 124* 123*  CO2  --    < > 20*  --  17* 15* 15* 15* 13*  GLUCOSE  --    < > 175*  --  143* 300* 293* 313* 206*  BUN  --    < > 13  --  64* 73* 77* 86* 100*  CREATININE  --    < > 1.36*  --  4.14* 4.77* 4.87* 4.94* 5.86*  CALCIUM  --    < > 9.5  --  9.0 8.7* 8.8* 9.0 9.3  MG 2.4  --  2.1  --   --   --   --   --   --   PHOS 2.8  --  3.1  --   --   --   --   --   --    < > = values in this interval not displayed.   Liver Function Tests: Recent Labs   Lab 03/03/2021 0548  AST 77*  ALT 23  ALKPHOS 35*  BILITOT 0.8  PROT 5.4*  ALBUMIN 2.1*   No results for input(s): LIPASE, AMYLASE in the last 168 hours. No results for input(s): AMMONIA in the last 168 hours. CBC: Recent Labs  Lab 03/08/21 0151 03/08/21 1217 03/09/21 0537 03/10/21 0022 03/10/21 1611 03/10/21 1824 03/10/21 2044 03/11/21 0906 02/18/2021 0548  WBC 19.1*  --  15.2* 17.0*  --   --   --  13.1* 17.4*  NEUTROABS  --   --   --   --   --   --   --   --  13.9*  HGB 16.5   < > 17.1* 17.8* 16.3 16.3 15.3 15.3 14.4  HCT 50.9   < > 50.8 52.1* 48.0 48.0 45.0 46.8 44.8  MCV 98.8  --  96.9 99.0  --   --   --  98.3 98.9  PLT 107*  --  102* 125*  --   --   --  125* 96*   < > = values in this interval not displayed.   Cardiac Enzymes: No results for input(s): CKTOTAL, CKMB, CKMBINDEX, TROPONINI in the last 168 hours. Sepsis Labs: Recent Labs  Lab 03/09/21 0537 03/10/21 0022 03/11/21 0906 03/01/2021 0548  WBC 15.2* 17.0* 13.1* 17.4*    Procedures/Operations  See emr for complete details   Briant Sites 02/25/2021, 8:41 AM

## 2021-03-17 NOTE — Progress Notes (Addendum)
I Emogene Morgan and NT Joni Reining entered the patient room for end of shift turns and patient care. Patient was turned to his right side to remove his pad, then to the left with wedge and pillows places. Patient was pulled up in the bed. At that time I noticed the patient was bradycardic and heart rate dropping. I called Eula Listen and then pulled code. Pads place and asystole was confirmed Code blue began at 1845 and ended at 1900. Family was notified.

## 2021-03-17 NOTE — Progress Notes (Signed)
Pt cth back and stable per radiology.  As pt's mental status has declined without clear etiology will order eeg at this time. Pt has been off sedation for >48 hours with exception of 1x bolus fentanyl when he just had desaturation to 70's suddenly.    Desaturation to 70's suddenly, lavaged with minimal return Cxr with some increase edema but renal failure limiting treatment of that and certainly not to degree of desat.  Le venous doppler pending ? Thrombosis with migration to PE but 2/2 renal failure unable to get cta pe and 2/2 sah unable to get a/c if positive.   All of this was discussed with family via phone and also significant other at bedside.   RN updated and at bedside as well.

## 2021-03-17 DEATH — deceased

## 2021-03-20 NOTE — Telephone Encounter (Signed)
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  I called patient and he asked that end date on the leave be 03/16/2021.  No fee required.
